# Patient Record
Sex: Female | Born: 1945
Health system: Southern US, Community
[De-identification: ages and names within clinical notes are randomized; demographics above are authoritative.]

## PROBLEM LIST (undated history)

## (undated) DIAGNOSIS — K5790 Diverticulosis of intestine, part unspecified, without perforation or abscess without bleeding: Secondary | ICD-10-CM

## (undated) DIAGNOSIS — Z923 Personal history of irradiation: Secondary | ICD-10-CM

## (undated) DIAGNOSIS — E785 Hyperlipidemia, unspecified: Secondary | ICD-10-CM

## (undated) DIAGNOSIS — C50919 Malignant neoplasm of unspecified site of unspecified female breast: Secondary | ICD-10-CM

## (undated) DIAGNOSIS — L308 Other specified dermatitis: Secondary | ICD-10-CM

## (undated) DIAGNOSIS — N200 Calculus of kidney: Secondary | ICD-10-CM

## (undated) DIAGNOSIS — Z87442 Personal history of urinary calculi: Secondary | ICD-10-CM

## (undated) DIAGNOSIS — M47812 Spondylosis without myelopathy or radiculopathy, cervical region: Secondary | ICD-10-CM

## (undated) DIAGNOSIS — I509 Heart failure, unspecified: Secondary | ICD-10-CM

## (undated) DIAGNOSIS — Z853 Personal history of malignant neoplasm of breast: Secondary | ICD-10-CM

## (undated) DIAGNOSIS — E049 Nontoxic goiter, unspecified: Secondary | ICD-10-CM

## (undated) DIAGNOSIS — I2510 Atherosclerotic heart disease of native coronary artery without angina pectoris: Secondary | ICD-10-CM

## (undated) DIAGNOSIS — M1990 Unspecified osteoarthritis, unspecified site: Secondary | ICD-10-CM

## (undated) DIAGNOSIS — I10 Essential (primary) hypertension: Secondary | ICD-10-CM

## (undated) DIAGNOSIS — D649 Anemia, unspecified: Secondary | ICD-10-CM

## (undated) DIAGNOSIS — C801 Malignant (primary) neoplasm, unspecified: Secondary | ICD-10-CM

## (undated) DIAGNOSIS — M47816 Spondylosis without myelopathy or radiculopathy, lumbar region: Secondary | ICD-10-CM

## (undated) DIAGNOSIS — K219 Gastro-esophageal reflux disease without esophagitis: Secondary | ICD-10-CM

## (undated) DIAGNOSIS — F339 Major depressive disorder, recurrent, unspecified: Secondary | ICD-10-CM

## (undated) DIAGNOSIS — T7840XA Allergy, unspecified, initial encounter: Secondary | ICD-10-CM

## (undated) DIAGNOSIS — E119 Type 2 diabetes mellitus without complications: Secondary | ICD-10-CM

## (undated) DIAGNOSIS — Z5189 Encounter for other specified aftercare: Secondary | ICD-10-CM

## (undated) HISTORY — DX: Unspecified osteoarthritis, unspecified site: M19.90

## (undated) HISTORY — DX: Nontoxic goiter, unspecified: E04.9

## (undated) HISTORY — DX: Other specified dermatitis: L30.8

## (undated) HISTORY — DX: Heart failure, unspecified: I50.9

## (undated) HISTORY — PX: KNEE ARTHROSCOPY: SUR90

## (undated) HISTORY — DX: Spondylosis without myelopathy or radiculopathy, lumbar region: M47.816

## (undated) HISTORY — DX: Allergy, unspecified, initial encounter: T78.40XA

## (undated) HISTORY — DX: Major depressive disorder, recurrent, unspecified: F33.9

## (undated) HISTORY — DX: Personal history of malignant neoplasm of breast: Z85.3

## (undated) HISTORY — DX: Diverticulosis of intestine, part unspecified, without perforation or abscess without bleeding: K57.90

## (undated) HISTORY — PX: APPENDECTOMY: SHX54

## (undated) HISTORY — DX: Calculus of kidney: N20.0

## (undated) HISTORY — DX: Atherosclerotic heart disease of native coronary artery without angina pectoris: I25.10

## (undated) HISTORY — DX: Hyperlipidemia, unspecified: E78.5

## (undated) HISTORY — DX: Type 2 diabetes mellitus without complications: E11.9

## (undated) HISTORY — DX: Encounter for other specified aftercare: Z51.89

## (undated) HISTORY — DX: Malignant (primary) neoplasm, unspecified: C80.1

## (undated) HISTORY — PX: CHOLECYSTECTOMY: SHX55

## (undated) HISTORY — DX: Spondylosis without myelopathy or radiculopathy, cervical region: M47.812

## (undated) HISTORY — DX: Essential (primary) hypertension: I10

## (undated) HISTORY — PX: TUBAL LIGATION: SHX77

## (undated) HISTORY — DX: Gastro-esophageal reflux disease without esophagitis: K21.9

## (undated) HISTORY — PX: TOTAL HIP ARTHROPLASTY: SHX124

---

## 1995-03-24 DIAGNOSIS — C50919 Malignant neoplasm of unspecified site of unspecified female breast: Secondary | ICD-10-CM

## 1995-03-24 DIAGNOSIS — Z923 Personal history of irradiation: Secondary | ICD-10-CM | POA: Insufficient documentation

## 1995-03-24 HISTORY — PX: BREAST LUMPECTOMY: SHX2

## 1995-03-24 HISTORY — DX: Malignant neoplasm of unspecified site of unspecified female breast: C50.919

## 1995-03-24 HISTORY — DX: Personal history of irradiation: Z92.3

## 1996-03-23 HISTORY — PX: REDUCTION MAMMAPLASTY: SUR839

## 2011-01-28 DIAGNOSIS — I2510 Atherosclerotic heart disease of native coronary artery without angina pectoris: Secondary | ICD-10-CM | POA: Insufficient documentation

## 2011-01-28 HISTORY — DX: Atherosclerotic heart disease of native coronary artery without angina pectoris: I25.10

## 2011-06-19 DIAGNOSIS — M545 Low back pain, unspecified: Secondary | ICD-10-CM

## 2011-06-19 DIAGNOSIS — M47817 Spondylosis without myelopathy or radiculopathy, lumbosacral region: Secondary | ICD-10-CM

## 2011-06-19 DIAGNOSIS — M542 Cervicalgia: Secondary | ICD-10-CM

## 2011-06-19 DIAGNOSIS — M47812 Spondylosis without myelopathy or radiculopathy, cervical region: Secondary | ICD-10-CM

## 2011-06-19 HISTORY — DX: Cervicalgia: M54.2

## 2011-06-19 HISTORY — DX: Spondylosis without myelopathy or radiculopathy, cervical region: M47.812

## 2011-06-19 HISTORY — DX: Low back pain, unspecified: M54.50

## 2011-06-19 HISTORY — DX: Spondylosis without myelopathy or radiculopathy, lumbosacral region: M47.817

## 2014-11-22 DIAGNOSIS — Z9109 Other allergy status, other than to drugs and biological substances: Secondary | ICD-10-CM | POA: Insufficient documentation

## 2014-11-22 DIAGNOSIS — J3081 Allergic rhinitis due to animal (cat) (dog) hair and dander: Secondary | ICD-10-CM | POA: Insufficient documentation

## 2014-11-22 DIAGNOSIS — Z91018 Allergy to other foods: Secondary | ICD-10-CM

## 2014-11-22 HISTORY — DX: Allergic rhinitis due to animal (cat) (dog) hair and dander: J30.81

## 2014-11-22 HISTORY — DX: Allergy to other foods: Z91.018

## 2014-11-22 HISTORY — DX: Other allergy status, other than to drugs and biological substances: Z91.09

## 2016-03-31 DIAGNOSIS — N200 Calculus of kidney: Secondary | ICD-10-CM

## 2016-03-31 DIAGNOSIS — Z9889 Other specified postprocedural states: Secondary | ICD-10-CM

## 2016-03-31 DIAGNOSIS — K219 Gastro-esophageal reflux disease without esophagitis: Secondary | ICD-10-CM

## 2016-03-31 DIAGNOSIS — R9439 Abnormal result of other cardiovascular function study: Secondary | ICD-10-CM

## 2016-03-31 DIAGNOSIS — E049 Nontoxic goiter, unspecified: Secondary | ICD-10-CM

## 2016-03-31 HISTORY — DX: Nontoxic goiter, unspecified: E04.9

## 2016-03-31 HISTORY — DX: Other specified postprocedural states: Z98.890

## 2016-03-31 HISTORY — DX: Gastro-esophageal reflux disease without esophagitis: K21.9

## 2016-03-31 HISTORY — DX: Calculus of kidney: N20.0

## 2016-03-31 HISTORY — DX: Abnormal result of other cardiovascular function study: R94.39

## 2016-06-16 DIAGNOSIS — J343 Hypertrophy of nasal turbinates: Secondary | ICD-10-CM | POA: Insufficient documentation

## 2016-06-16 HISTORY — DX: Hypertrophy of nasal turbinates: J34.3

## 2016-10-16 ENCOUNTER — Encounter: Payer: Self-pay | Admitting: Student in an Organized Health Care Education/Training Program

## 2016-10-16 ENCOUNTER — Ambulatory Visit (INDEPENDENT_AMBULATORY_CARE_PROVIDER_SITE_OTHER): Payer: Medicare Other | Admitting: Student in an Organized Health Care Education/Training Program

## 2016-10-16 VITALS — BP 122/80 | HR 74 | Temp 98.6°F | Ht 61.0 in | Wt 177.0 lb

## 2016-10-16 DIAGNOSIS — Z7689 Persons encountering health services in other specified circumstances: Secondary | ICD-10-CM | POA: Diagnosis not present

## 2016-10-16 DIAGNOSIS — I509 Heart failure, unspecified: Secondary | ICD-10-CM | POA: Insufficient documentation

## 2016-10-16 DIAGNOSIS — Z794 Long term (current) use of insulin: Secondary | ICD-10-CM | POA: Diagnosis not present

## 2016-10-16 DIAGNOSIS — M79641 Pain in right hand: Secondary | ICD-10-CM | POA: Insufficient documentation

## 2016-10-16 DIAGNOSIS — E118 Type 2 diabetes mellitus with unspecified complications: Secondary | ICD-10-CM | POA: Insufficient documentation

## 2016-10-16 DIAGNOSIS — C50919 Malignant neoplasm of unspecified site of unspecified female breast: Secondary | ICD-10-CM | POA: Insufficient documentation

## 2016-10-16 DIAGNOSIS — I10 Essential (primary) hypertension: Secondary | ICD-10-CM | POA: Diagnosis not present

## 2016-10-16 DIAGNOSIS — E119 Type 2 diabetes mellitus without complications: Secondary | ICD-10-CM

## 2016-10-16 HISTORY — DX: Type 2 diabetes mellitus with unspecified complications: E11.8

## 2016-10-16 LAB — POCT GLYCOSYLATED HEMOGLOBIN (HGB A1C): Hemoglobin A1C: 10

## 2016-10-16 MED ORDER — INSULIN LISPRO 100 UNIT/ML ~~LOC~~ SOLN: 16.0000 [IU] | Freq: Three times a day (TID) | SUBCUTANEOUS | 11 refills | Status: DC

## 2016-10-16 MED ORDER — VALSARTAN 160 MG PO TABS: 160.0000 mg | ORAL_TABLET | Freq: Every day | ORAL | 0 refills | Status: DC

## 2016-10-16 MED ORDER — METFORMIN HCL 500 MG PO TABS: 500.0000 mg | ORAL_TABLET | Freq: Every day | ORAL | 0 refills | Status: DC

## 2016-10-16 MED ORDER — INSULIN GLARGINE 100 UNIT/ML ~~LOC~~ SOLN: 56.0000 [IU] | Freq: Every day | SUBCUTANEOUS | 11 refills | Status: DC

## 2016-10-16 MED ORDER — HYDROCHLOROTHIAZIDE 25 MG PO TABS
25.0000 mg | ORAL_TABLET | Freq: Every day | ORAL | 3 refills | Status: DC
Start: 2016-10-16 — End: 2017-07-07

## 2016-10-16 NOTE — Assessment & Plan Note (Addendum)
-   BP controlled today - on valsartan 160 mg daily, HCTZ 25 mg daily - refilled medications - check lipid panel today - check Bmet for creatinine and electrolytes on these medications as she is new to our clinic

## 2016-10-16 NOTE — Assessment & Plan Note (Signed)
-   refilled medications - HbA1c ordered today (previously elevated at 12.0 3 months ago per patient report) - recheck HbA1c today - have patient return for diabetes visit, to make appt as she leaves office today

## 2016-10-16 NOTE — Assessment & Plan Note (Signed)
-   may be tendon pain/injury from moving boxes - continue OTC pain control - continue to encourage movement of the hand, stretches and exercise - follow up if worsens or fails to improve

## 2016-10-16 NOTE — Patient Instructions (Signed)
It was a pleasure meeting you today in our clinic. Here is the treatment plan we have discussed and agreed upon together:  - for your hand stiffness, please continue to stretch and move your hand every day - you can use ibuprofen and tylenol for hand pain  We will collect an HbA1c and blood work at today's visit and I will call you with the results. As you are leaving today, please schedule a follow up appointment to be seen in our clinic as soon as there is availability.  Our clinic's number is [phone]. Please call with questions or concerns about what we discussed today.  Be well, Dr. Burr Medico

## 2016-10-16 NOTE — Progress Notes (Signed)
CC: Initial visit to establish care and right hand stiffness  HPI: Bonnie Reed is a 71 y.o. female  who presents to [facility] today to establish care as a new patient, and notes right hand intermittent stiffness.  Right wrist pain - 2 weeks duration - Associated with stiffness - came on when moving boxes, recently moved houses - ibuprofen helps - notices stiffness is worse in AM asnd improves with stretching and moving her hand - no recent fevers, no warmth or redness over the hand or finger joints  Reported PMH: Hay fever Breast Cancer Chronic pain Congestive heart failure - No medications, diagnosed 2010 HTN - valsartan-HCTZ 160 -25 mg daily Diabetes - last HbA1c 12.0 3 months ago per pt report  -  metformin 500 mg daily  - lantus 56u nightly  - humalog 16u TID with meals   PSH: Cholecystectomy 1975 Appendectomy 1957 C-section 1972 Tubal ligation 1973 Orthopedic surgery 2007 Lumpectomy 1995   Family Hx: [place], asthma, lung cancer in father, heart disease in mother, diabetes, high cholesterol, HTN, kidney disease and stroke in mother and grandparents  Social Hx: No tobacco, no Etoh, no recreational drug use. Does not exercise regularly. Lives at home with sister and her husband  Allergies: Hay fever Codeine (nausea)  health maintenance Colonoscopy 2018 (normal per pt report) Mammogram 2018 (normal per pt report) Heart stress test 2010   Review of Symptoms:  See HPI for ROS.   CC, SH/smoking status, and VS noted.  Objective: BP 122/80   Pulse 74   Temp 98.6 F (37 C) (Oral)   Ht 5' 1" (1.549 m)   Wt 177 lb (80.3 kg)   LMP 10/17/1991 (Exact Date)   SpO2 94%   BMI 33.44 kg/m  GEN: NAD, alert, cooperative, and pleasant. EYE: no conjunctival injection, pupils equally round and reactive to light ENMT: normal tympanic light reflex, no nasal polyps,no rhinorrhea, no pharyngeal erythema or exudates NECK: full ROM RESPIRATORY: clear to auscultation  bilaterally with no wheezes, rhonchi or rales, good effort CV: RRR, no m/r/g, no peripheral edema GI: soft, non-tender, non-distended, no hepatosplenomegaly SKIN: warm and dry, no rashes or lesions MSK: +right hand tenderness over dorsal and ventral aspects of the hand, pain with passive and active extension of the fingers, no redness or swelling, strong palpable pulses noted NEURO: II-XII grossly intact PSYCH: AAOx3, appropriate affect  Assessment and plan:  Hypertension - BP controlled today - on valsartan 160 mg daily, HCTZ 25 mg daily - refilled medications - check lipid panel today - check Bmet for creatinine and electrolytes on these medications as she is new to our clinic  Diabetes ([place]) - refilled medications - HbA1c ordered today (previously elevated at 12.0 3 months ago per patient report) - recheck HbA1c today - have patient return for diabetes visit, to make appt as she leaves office today  Right hand pain - may be tendon pain/injury from moving boxes - continue OTC pain control - continue to encourage movement of the hand, stretches and exercise - follow up if worsens or fails to improve   Orders Placed This Encounter  Procedures  . Lipid panel  . CMP14+EGFR  . POCT glycosylated hemoglobin (Hb A1C)    Meds ordered this encounter  Medications  . metFORMIN (GLUCOPHAGE) 500 MG tablet    Sig: Take 1 tablet (500 mg total) by mouth daily.    Dispense:  90 tablet    Refill:  0  . insulin glargine (LANTUS) 100 UNIT/ML  injection    Sig: Inject 0.56 mLs (56 Units total) into the skin at bedtime.    Dispense:  10 mL    Refill:  11  . insulin lispro (HUMALOG) 100 UNIT/ML injection    Sig: Inject 0.16 mLs (16 Units total) into the skin 3 (three) times daily before meals.    Dispense:  10 mL    Refill:  11  . valsartan (DIOVAN) 160 MG tablet    Sig: Take 1 tablet (160 mg total) by mouth daily.    Dispense:  30 tablet    Refill:  0  . hydrochlorothiazide  (HYDRODIURIL) 25 MG tablet    Sig: Take 1 tablet (25 mg total) by mouth daily.    Dispense:  90 tablet    Refill:  3     Everrett Coombe, MD,MS,  PGY2 10/16/2016 10:51 AM

## 2016-10-17 LAB — CMP14+EGFR
A/G RATIO: 1.3 (ref 1.2–2.2)
ALT: 15 IU/L (ref 0–32)
AST: 18 IU/L (ref 0–40)
Albumin: 4.1 g/dL (ref 3.5–4.8)
Alkaline Phosphatase: 143 IU/L — ABNORMAL HIGH (ref 39–117)
BILIRUBIN TOTAL: 0.2 mg/dL (ref 0.0–1.2)
BUN/Creatinine Ratio: 15 (ref 12–28)
BUN: 12 mg/dL (ref 8–27)
CALCIUM: 9.8 mg/dL (ref 8.7–10.3)
CHLORIDE: 103 mmol/L (ref 96–106)
CO2: 21 mmol/L (ref 20–29)
Creatinine, Ser: 0.82 mg/dL (ref 0.57–1.00)
GFR, EST AFRICAN AMERICAN: 84 mL/min/{1.73_m2} (ref >59)
GFR, EST NON AFRICAN AMERICAN: 73 mL/min/{1.73_m2} (ref >59)
GLOBULIN, TOTAL: 3.1 g/dL (ref 1.5–4.5)
Glucose: 130 mg/dL — ABNORMAL HIGH (ref 65–99)
POTASSIUM: 3.8 mmol/L (ref 3.5–5.2)
SODIUM: 141 mmol/L (ref 134–144)
Total Protein: 7.2 g/dL (ref 6.0–8.5)

## 2016-10-17 LAB — LIPID PANEL
CHOL/HDL RATIO: 3.8 ratio (ref 0.0–4.4)
Cholesterol, Total: 149 mg/dL (ref 100–199)
HDL: 39 mg/dL — ABNORMAL LOW (ref >39)
LDL Calculated: 94 mg/dL (ref 0–99)
Triglycerides: 78 mg/dL (ref 0–149)
VLDL CHOLESTEROL CAL: 16 mg/dL (ref 5–40)

## 2016-11-04 ENCOUNTER — Telehealth: Payer: Self-pay | Admitting: Student in an Organized Health Care Education/Training Program

## 2016-11-04 ENCOUNTER — Encounter: Payer: Self-pay | Admitting: Licensed Clinical Social Worker

## 2016-11-04 ENCOUNTER — Ambulatory Visit (INDEPENDENT_AMBULATORY_CARE_PROVIDER_SITE_OTHER): Payer: Medicare Other | Admitting: Student in an Organized Health Care Education/Training Program

## 2016-11-04 ENCOUNTER — Encounter: Payer: Self-pay | Admitting: Student in an Organized Health Care Education/Training Program

## 2016-11-04 VITALS — BP 135/80 | HR 86 | Temp 98.0°F | Wt 177.0 lb

## 2016-11-04 DIAGNOSIS — F321 Major depressive disorder, single episode, moderate: Secondary | ICD-10-CM | POA: Diagnosis present

## 2016-11-04 DIAGNOSIS — E118 Type 2 diabetes mellitus with unspecified complications: Secondary | ICD-10-CM | POA: Diagnosis not present

## 2016-11-04 DIAGNOSIS — Z794 Long term (current) use of insulin: Secondary | ICD-10-CM

## 2016-11-04 NOTE — Progress Notes (Signed)
Total time:15 minutes Type of Service: Sharon warm handoff  Interpretor:No.    SUBJECTIVE: Bonnie Reed is a 71 y.o. female  Patient was referred by Dr. Burr Medico for: symptoms of depression and Stress at home.  Patient reports the following symptoms/concerns: difficulty sleeping, change in appetite, feeling unhappy, concerns with her sister's change in behavior and caring for her brother-in-law .  Hx of therapy 10 years ago, no inpatient treatment, denies SI  LIFE CONTEXT:  Family & Social: lives with sister and brother-in-law,  School/ Work: does not work  Life changes: recently moved to [place] from [place], Sister had a stroke shortly after they moved to [place], concerns with behavioral changes for sister. Now has to care for sister and brother-in-law.  GOALS:  Patient will reduce symptoms of: stress and depressive symptoms , and increase knowledge/ ability LH:TDSKAJ skills, self-management skills and stress reduction,   Intervention: Reflective listening, Behavioral Therapy (Relaxed breathing), [employer] and psychoeducation.    Issues discussed: relaxation techniques, sleep hygiene, community support (resources for sister and brother-in-law.      ASSESSMENT:Patient currently experiencing stress and symptoms of situational depression .  Symptoms of exacerbated by family stressor( recent behavioral changes with sister due to stroke and caring for brother-in-law that has dementia).  Patient may benefit from, and is in agreement to receive further assessment and brief therapeutic interventions to assist with managing her stress.  Patient has declined a F/U call from [facility].  PLAN: 1. Patient will implement relaxed breathing and sleep hygiene check list 2. Patient will look into support group information provided 3. Patient will talk to sister about going to see her doctor ref. Changes in her behavior. 4. Patient will schedule a f/u visit with  [facility].  Casimer Lanius, LCSW Licensed Clinical Social Worker [place]   [phone] 3:13 PM

## 2016-11-04 NOTE — Progress Notes (Signed)
   CC: diabetes and depressive episode  HPI: Bonnie Reed is a 71 y.o. female .   DIABETES Disease Monitoring: 09/2016 A1c was 10.0 Blood Sugar ranges- AM sugar 192, 172, 270. Rarely less than 150 and have not been less than 120 "in years"  Polyuria/phagia/dipsia- none        Visual problems- Denies Medications: 16 TID humalog, 52u Lantus  Compliance- 90% Per patient Hypoglycemic symptoms- one time last week  Depession "I am real depressed today." Her sister has recently had a stroke and has become verbally abusive. Sister is fine during the day, but mean at night.  Patient is also responsible for caring for her brother-in-law who is disabled with alzheimers. She says that if she does not care for them, they would have to go to SNF. She does not feel ready to have them go to SNF. She feels if she could just take time to care for herself she would be better able to cope with her sister's meanness. - Sleep: decreased, states she feels amped up and has anxiety because altercations with her sister tend to occur at night before bed - Interest: decreased she states due to feeling distracted by her sister's mean words - Guilt: yes, in particular about inadequacy in caring for her family - Energy: decreased she says in setting of less sleep as noted above - Concentration: Worsened, states she is worried about when sister is going to snap - Appetite: decreased - Psychometer: difficult to get out bed - Suicide/Homicide: Denies  Monitoring Labs and Parameters Last A1C:  Lab Results  Component Value Date   HGBA1C 10.0 10/16/2016    Overdue health maintenance Mammogram - completed per patient, health maintenance request form signed today. Colonoscopy form provided.  Review of Symptoms:  See HPI for ROS.   CC, SH/smoking status, and VS noted.  Objective: BP 135/80 (BP Location: Right Arm, Patient Position: Sitting, Cuff Size: Normal)   Pulse 86   Temp 98 F (36.7 C) (Oral)   Wt 177  lb (80.3 kg)   LMP 10/17/1991 (Exact Date)   SpO2 96%   BMI 33.44 kg/m  GEN: NAD, alert, cooperative, and pleasant. RESPIRATORY: clear to auscultation bilaterally with no wheezes, rhonchi or rales, good effort CV: RRR, no m/r/g, no peripheral edema GI: soft, non-tender, non-distended, no hepatosplenomegaly PSYCH: AAOx3, appropriate affect  Assessment and plan:  Diabetes (HCC) HbA1c 10.0 - patient on metformin 500 mg, prefers not to go up on this due to stomach upset - Room to inc lantus given AM hyperglycemia will conservatively go up from 52 units to 55 units each AM - red flags and return precautions provided - patient asked for refill on humalog needles - called to ask her for specific size needle required but have not heard back yet.   Current moderate episode of major depressive disorder without prior episode (HCC) - SIGECAPS score of 7 - No suicidal or homicidal ideation - seems to be stressors related home environment/having to care for her sister and brother in law - consult to integrative care today - patient to follow up - please see separate integrative care note - can consider starting an antidepressant if persists, however I wonder if she takes the time to speak with behavioral health and learn coping strategies this will be therapeutic enough to get her through this episode   Everrett Coombe, MD,MS,  PGY2 11/04/2016 3:48 PM

## 2016-11-04 NOTE — Patient Instructions (Addendum)
It was a pleasure seeing you today in our clinic. Today we discussed your diabetes and your mental health. Here is the treatment plan we have discussed and agreed upon together:  Diabetes Your diabetes is not well controlled. Your A1c is 10 Your goal is to have an A1c < 7.5 Medicine Changes: Please increase your lantus from 52 units to 55 units each night.  If you notice palpitations or sweating, confusion, or other symptoms of low blood sugars, please take a sip of orange juice or have something with sugar and check your blood sugar.  This would be a reason to call our office.   Come back for your next A1c in October.  Please write down your morning blood sugars so that we can better titrate your medications. Bring your list of blood sugars into the next appointment.   Our clinic's number is [phone]. Please call with questions or concerns about what we discussed today.  Be well, Dr. Burr Medico

## 2016-11-04 NOTE — Assessment & Plan Note (Signed)
-   SIGECAPS score of 7 - No suicidal or homicidal ideation - seems to be stressors related home environment/having to care for her sister and brother in law - consult to integrative care today - patient to follow up - please see separate integrative care note - can consider starting an antidepressant if persists, however I wonder if she takes the time to speak with behavioral health and learn coping strategies this will be therapeutic enough to get her through this episode

## 2016-11-04 NOTE — Telephone Encounter (Signed)
LVM for patient to call clinic and inform as to the size humalog needles she has at home so they can be refilled.

## 2016-11-04 NOTE — Assessment & Plan Note (Signed)
HbA1c 10.0 - patient on metformin 500 mg, prefers not to go up on this due to stomach upset - Room to inc lantus given AM hyperglycemia will conservatively go up from 52 units to 55 units each AM - red flags and return precautions provided - patient asked for refill on humalog needles - called to ask her for specific size needle required but have not heard back yet.

## 2016-12-10 ENCOUNTER — Emergency Department (HOSPITAL_BASED_OUTPATIENT_CLINIC_OR_DEPARTMENT_OTHER)
Admission: EM | Admit: 2016-12-10 | Discharge: 2016-12-10 | Disposition: A | Discharge status: Home / Self Care | Payer: Medicare Other | Attending: Emergency Medicine | Admitting: Emergency Medicine

## 2016-12-10 ENCOUNTER — Encounter (HOSPITAL_BASED_OUTPATIENT_CLINIC_OR_DEPARTMENT_OTHER): Payer: Self-pay | Admitting: Emergency Medicine

## 2016-12-10 ENCOUNTER — Emergency Department (HOSPITAL_BASED_OUTPATIENT_CLINIC_OR_DEPARTMENT_OTHER): Payer: Medicare Other

## 2016-12-10 DIAGNOSIS — J0100 Acute maxillary sinusitis, unspecified: Secondary | ICD-10-CM | POA: Diagnosis not present

## 2016-12-10 DIAGNOSIS — E119 Type 2 diabetes mellitus without complications: Secondary | ICD-10-CM | POA: Diagnosis not present

## 2016-12-10 DIAGNOSIS — I110 Hypertensive heart disease with heart failure: Secondary | ICD-10-CM | POA: Diagnosis not present

## 2016-12-10 DIAGNOSIS — Z853 Personal history of malignant neoplasm of breast: Secondary | ICD-10-CM | POA: Insufficient documentation

## 2016-12-10 DIAGNOSIS — M545 Low back pain, unspecified: Secondary | ICD-10-CM

## 2016-12-10 DIAGNOSIS — Z794 Long term (current) use of insulin: Secondary | ICD-10-CM | POA: Insufficient documentation

## 2016-12-10 DIAGNOSIS — R1031 Right lower quadrant pain: Secondary | ICD-10-CM | POA: Insufficient documentation

## 2016-12-10 DIAGNOSIS — Z87442 Personal history of urinary calculi: Secondary | ICD-10-CM | POA: Diagnosis not present

## 2016-12-10 DIAGNOSIS — I509 Heart failure, unspecified: Secondary | ICD-10-CM | POA: Insufficient documentation

## 2016-12-10 LAB — CBC WITH DIFFERENTIAL/PLATELET
BASOS ABS: 0 10*3/uL (ref 0.0–0.1)
BASOS PCT: 0 %
EOS ABS: 0.3 10*3/uL (ref 0.0–0.7)
Eosinophils Relative: 3 %
HCT: 38.9 % (ref 36.0–46.0)
HEMOGLOBIN: 12.6 g/dL (ref 12.0–15.0)
LYMPHS PCT: 27 %
Lymphs Abs: 2.7 10*3/uL (ref 0.7–4.0)
MCH: 22.4 pg — AB (ref 26.0–34.0)
MCHC: 32.4 g/dL (ref 30.0–36.0)
MCV: 69.1 fL — ABNORMAL LOW (ref 78.0–100.0)
MONO ABS: 1.2 10*3/uL — AB (ref 0.1–1.0)
Monocytes Relative: 12 %
NEUTROS ABS: 5.7 10*3/uL (ref 1.7–7.7)
Neutrophils Relative %: 58 %
PLATELETS: 304 10*3/uL (ref 150–400)
RBC: 5.63 MIL/uL — ABNORMAL HIGH (ref 3.87–5.11)
RDW: 17.6 % — AB (ref 11.5–15.5)
WBC: 9.9 10*3/uL (ref 4.0–10.5)

## 2016-12-10 LAB — URINALYSIS, MICROSCOPIC (REFLEX)

## 2016-12-10 LAB — BASIC METABOLIC PANEL
ANION GAP: 8 (ref 5–15)
BUN: 15 mg/dL (ref 6–20)
CO2: 25 mmol/L (ref 22–32)
CREATININE: 1.05 mg/dL — AB (ref 0.44–1.00)
Calcium: 9.4 mg/dL (ref 8.9–10.3)
Chloride: 106 mmol/L (ref 101–111)
GFR, EST NON AFRICAN AMERICAN: 53 mL/min — AB (ref >60)
GLUCOSE: 163 mg/dL — AB (ref 65–99)
Potassium: 3.6 mmol/L (ref 3.5–5.1)
Sodium: 139 mmol/L (ref 135–145)

## 2016-12-10 LAB — URINALYSIS, ROUTINE W REFLEX MICROSCOPIC
Bilirubin Urine: NEGATIVE
Glucose, UA: NEGATIVE mg/dL
Ketones, ur: NEGATIVE mg/dL
NITRITE: NEGATIVE
PROTEIN: 30 mg/dL — AB
pH: 6 (ref 5.0–8.0)

## 2016-12-10 MED ORDER — HYDROCODONE-ACETAMINOPHEN 5-325 MG PO TABS: 1.0000 | ORAL_TABLET | Freq: Four times a day (QID) | ORAL | 0 refills | Status: DC | PRN

## 2016-12-10 MED ORDER — HYDROCODONE-ACETAMINOPHEN 5-325 MG PO TABS
1.0000 | ORAL_TABLET | Freq: Once | ORAL | Status: AC
  Administered 2016-12-10: 1 via ORAL
  Filled 2016-12-10: qty 1

## 2016-12-10 MED ORDER — AZITHROMYCIN 250 MG PO TABS: 250.0000 mg | ORAL_TABLET | Freq: Every day | ORAL | 0 refills | Status: DC

## 2016-12-10 NOTE — ED Triage Notes (Signed)
Patient states that she is having right sided flank pain with increased urine frequency. The patient also reports that she has had a sinus congestion that led to generalize aches last night
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
## 2016-12-10 NOTE — Discharge Instructions (Signed)
Workup for the back pain and flank pain without any acute findings. CT negative, urinalysis negative. May be muscular skeletal back pain in nature. Take pain medication as directed. Follow-up if not improving in 7 days or for any new or worse symptoms. Also take the antibiotic as directed for the sinus infection.

## 2016-12-31 ENCOUNTER — Emergency Department (HOSPITAL_COMMUNITY): Payer: Medicare Other

## 2016-12-31 ENCOUNTER — Encounter (HOSPITAL_COMMUNITY): Payer: Self-pay | Admitting: Emergency Medicine

## 2016-12-31 ENCOUNTER — Telehealth: Payer: Self-pay | Admitting: *Deleted

## 2016-12-31 ENCOUNTER — Emergency Department (HOSPITAL_COMMUNITY)
Admission: EM | Admit: 2016-12-31 | Discharge: 2016-12-31 | Disposition: A | Discharge status: Home / Self Care | Payer: Medicare Other | Attending: Emergency Medicine | Admitting: Emergency Medicine

## 2016-12-31 DIAGNOSIS — I509 Heart failure, unspecified: Secondary | ICD-10-CM | POA: Insufficient documentation

## 2016-12-31 DIAGNOSIS — S59912A Unspecified injury of left forearm, initial encounter: Secondary | ICD-10-CM | POA: Diagnosis not present

## 2016-12-31 DIAGNOSIS — S161XXA Strain of muscle, fascia and tendon at neck level, initial encounter: Secondary | ICD-10-CM | POA: Diagnosis not present

## 2016-12-31 DIAGNOSIS — Y93K1 Activity, walking an animal: Secondary | ICD-10-CM | POA: Insufficient documentation

## 2016-12-31 DIAGNOSIS — Y92008 Other place in unspecified non-institutional (private) residence as the place of occurrence of the external cause: Secondary | ICD-10-CM | POA: Diagnosis not present

## 2016-12-31 DIAGNOSIS — W19XXXA Unspecified fall, initial encounter: Secondary | ICD-10-CM

## 2016-12-31 DIAGNOSIS — Y999 Unspecified external cause status: Secondary | ICD-10-CM | POA: Insufficient documentation

## 2016-12-31 DIAGNOSIS — S199XXA Unspecified injury of neck, initial encounter: Secondary | ICD-10-CM | POA: Diagnosis present

## 2016-12-31 DIAGNOSIS — Z794 Long term (current) use of insulin: Secondary | ICD-10-CM | POA: Insufficient documentation

## 2016-12-31 DIAGNOSIS — Z7982 Long term (current) use of aspirin: Secondary | ICD-10-CM | POA: Insufficient documentation

## 2016-12-31 DIAGNOSIS — W010XXA Fall on same level from slipping, tripping and stumbling without subsequent striking against object, initial encounter: Secondary | ICD-10-CM | POA: Diagnosis not present

## 2016-12-31 DIAGNOSIS — E119 Type 2 diabetes mellitus without complications: Secondary | ICD-10-CM | POA: Diagnosis not present

## 2016-12-31 DIAGNOSIS — Z79899 Other long term (current) drug therapy: Secondary | ICD-10-CM | POA: Insufficient documentation

## 2016-12-31 DIAGNOSIS — S4992XA Unspecified injury of left shoulder and upper arm, initial encounter: Secondary | ICD-10-CM | POA: Diagnosis not present

## 2016-12-31 DIAGNOSIS — I110 Hypertensive heart disease with heart failure: Secondary | ICD-10-CM | POA: Insufficient documentation

## 2016-12-31 MED ORDER — OXYCODONE-ACETAMINOPHEN 5-325 MG PO TABS
1.0000 | ORAL_TABLET | Freq: Once | ORAL | Status: AC
  Administered 2016-12-31: 1 via ORAL
  Filled 2016-12-31: qty 1

## 2016-12-31 MED ORDER — HYDROCODONE-ACETAMINOPHEN 5-325 MG PO TABS: 1.0000 | ORAL_TABLET | Freq: Four times a day (QID) | ORAL | 0 refills | Status: DC | PRN

## 2016-12-31 MED ORDER — IBUPROFEN 800 MG PO TABS: 800.0000 mg | ORAL_TABLET | Freq: Three times a day (TID) | ORAL | 0 refills | Status: DC | PRN

## 2016-12-31 NOTE — ED Provider Notes (Signed)
[facility] DEPT Provider Note   CSN: [id number] Arrival date & time: 12/31/16  7564     History   Chief Complaint Chief Complaint  Patient presents with  . Fall    HPI Bonnie Reed is a 70 y.o. female.  HPI Patient presents to the emergency department with injuries following a fall.  The patient states that she just finished walking her dog when she got her foot caught on the bottom of the dog fence and fell on her left side.  The patient, states she is having pain in the left shoulder.  She fell directly onto the shoulder.  She also is complaining of pain to the forearm and elbow region.  The patient states that movement and palpation make the pain worse.  She did not take any medications prior to arrival for her symptoms.  Patient denies chest pain, shortness of breath, nausea, vomiting, headache, blurred vision, weakness, numbness, dizziness, near syncope or syncope Past Medical History:  Diagnosis Date  . Allergy   . Blood transfusion without reported diagnosis   . Cancer ([place])   . CHF (congestive heart failure) ([place])   . Diabetes mellitus without complication ([place])   . Hypertension     Patient Active Problem List   Diagnosis Date Noted  . Current moderate episode of major depressive disorder without prior episode ([place]) 11/04/2016  . Breast cancer ([place]) 10/16/2016  . Diabetes ([place]) 10/16/2016  . Hypertension 10/16/2016  . CHF (congestive heart failure) ([place]) 10/16/2016    Past Surgical History:  Procedure Laterality Date  . APPENDECTOMY    . CESAREAN SECTION    . CHOLECYSTECTOMY    . TUBAL LIGATION      OB History    No data available       Home Medications    Prior to Admission medications   Medication Sig Start Date End Date Taking? Authorizing Provider  acetaminophen (TYLENOL) 500 MG tablet Take 1,000 mg by mouth daily as needed for mild pain.   Yes [provider]  aspirin EC 81 MG tablet Take 81 mg by mouth daily.   Yes [provider]  diphenhydrAMINE (BENADRYL) 25 mg capsule Take 25 mg by mouth daily as needed for allergies.   Yes [provider]  EPINEPHrine 0.3 mg/0.3 mL IJ SOAJ injection Inject 0.3 mg into the muscle. 12/07/14  Yes [provider]  hydrochlorothiazide (HYDRODIURIL) 25 MG tablet Take 1 tablet (25 mg total) by mouth daily. 10/16/16  Yes Everrett Coombe, MD  ibuprofen (ADVIL,MOTRIN) 200 MG tablet Take 400 mg by mouth daily as needed for headache.   Yes [provider]  insulin glargine (LANTUS) 100 UNIT/ML injection Inject 0.56 mLs (56 Units total) into the skin at bedtime. 10/16/16  Yes Everrett Coombe, MD  insulin lispro (HUMALOG) 100 UNIT/ML injection Inject 0.16 mLs (16 Units total) into the skin 3 (three) times daily before meals. 10/16/16  Yes Everrett Coombe, MD  metFORMIN (GLUCOPHAGE) 500 MG tablet Take 1 tablet (500 mg total) by mouth daily. 10/16/16  Yes Everrett Coombe, MD  montelukast (SINGULAIR) 10 MG tablet Take 10 mg by mouth daily. 09/08/16  Yes [provider]  sodium chloride (OCEAN) 0.65 % nasal spray Place 1 spray into the nose daily as needed for congestion.   Yes [provider]  azithromycin (ZITHROMAX) 250 MG tablet Take 1 tablet (250 mg total) by mouth daily. Take first 2 tablets together, then 1 every day until finished. Patient not taking: Reported on  12/31/2016 12/10/16   Fredia Sorrow, MD  HYDROcodone-acetaminophen (NORCO/VICODIN) 5-325 MG tablet Take 1-2 tablets by mouth every 6 (six) hours as needed for moderate pain. Patient not taking: Reported on 12/31/2016 12/10/16   Fredia Sorrow, MD  valsartan (DIOVAN) 160 MG tablet Take 1 tablet (160 mg total) by mouth daily. 10/16/16   Everrett Coombe, MD    Family History Family History  Problem Relation Age of Onset  . Stroke Mother   . Kidney disease Mother   . Cancer Father     Social History Social History  Substance Use Topics  . Smoking status: Never Smoker  . Smokeless tobacco:  Never Used  . Alcohol use No     Allergies   Chlorpheniramine; Other; and Phenylpropanol   Review of Systems Review of Systems  All other systems negative except as documented in the HPI. All pertinent positives and negatives as reviewed in the HPI.  Physical Exam Updated Vital Signs BP (!) 173/137   Pulse 70   Temp 97.7 F (36.5 C) (Oral)   Resp 20   LMP 10/17/1991 (Exact Date)   SpO2 99%   Physical Exam  Constitutional: She is oriented to person, place, and time. She appears well-developed and well-nourished. No distress.  HENT:  Head: Normocephalic and atraumatic.  Mouth/Throat: Oropharynx is clear and moist.  Eyes: Pupils are equal, round, and reactive to light.  Neck: Normal range of motion. Neck supple.  Cardiovascular: Normal rate, regular rhythm and normal heart sounds.  Exam reveals no gallop and no friction rub.   No murmur heard. Pulmonary/Chest: Effort normal and breath sounds normal. No respiratory distress. She has no wheezes.  Abdominal: Soft. Bowel sounds are normal. She exhibits no distension. There is no tenderness.  Musculoskeletal:       Left shoulder: She exhibits decreased range of motion, tenderness, bony tenderness and pain. She exhibits no effusion, no deformity, no laceration, no spasm, normal pulse and normal strength.       Left hip: She exhibits tenderness. She exhibits normal range of motion, normal strength, no bony tenderness, no swelling, no crepitus and no deformity.       Cervical back: She exhibits tenderness. She exhibits normal range of motion, no bony tenderness, no swelling and no edema.       Thoracic back: Normal.       Lumbar back: Normal.  Neurological: She is alert and oriented to person, place, and time. She exhibits normal muscle tone. Coordination normal.  Skin: Skin is warm and dry. Capillary refill takes less than 2 seconds. No rash noted. No erythema.  Psychiatric: She has a normal mood and affect. Her behavior is normal.    Nursing note and vitals reviewed.    ED Treatments / Results  Labs (all labs ordered are listed, but only abnormal results are displayed) Labs Reviewed - No data to display  EKG  EKG Interpretation None       Radiology Dg Forearm Left  Result Date: 12/31/2016 CLINICAL DATA:  Fall. EXAM: LEFT FOREARM - 2 VIEW COMPARISON:  No recent prior . FINDINGS: No acute bony or joint abnormality identified. Degenerative changes noted about the left elbow and wrist. IMPRESSION: Diffuse degenerative change.  No acute abnormality. Electronically Signed   By: Marcello Moores  Register   On: 12/31/2016 13:58   Ct Head Wo Contrast  Result Date: 12/31/2016 CLINICAL DATA:  Fall today.  No loss of consciousness. EXAM: CT HEAD WITHOUT CONTRAST CT CERVICAL SPINE WITHOUT CONTRAST TECHNIQUE: Multidetector CT  imaging of the head and cervical spine was performed following the standard protocol without intravenous contrast. Multiplanar CT image reconstructions of the cervical spine were also generated. COMPARISON:  None. FINDINGS: CT HEAD FINDINGS Brain: No evidence of acute infarction, hemorrhage, hydrocephalus, extra-axial collection or mass lesion/mass effect. Vascular: No hyperdense vessel or unexpected calcification. Skull: Normal. Negative for fracture or focal lesion. Sinuses/Orbits: No acute finding. Other: None. CT CERVICAL SPINE FINDINGS Alignment: Mild reversal of normal lordosis is noted secondary to degenerative change. Skull base and vertebrae: No acute fracture. No primary bone lesion or focal pathologic process. Soft tissues and spinal canal: No prevertebral fluid or swelling. No visible canal hematoma. Disc levels: Moderate degenerative disc disease is noted at C4-5, C5-6 and C6-7 with anterior osteophyte formation. Upper chest: Negative. Other: None. IMPRESSION: Normal head CT. Multilevel degenerative disc disease. No acute abnormality seen in the cervical spine. Electronically Signed   By: Marijo Conception,  M.D.   On: 12/31/2016 13:46   Ct Cervical Spine Wo Contrast  Result Date: 12/31/2016 CLINICAL DATA:  Fall today.  No loss of consciousness. EXAM: CT HEAD WITHOUT CONTRAST CT CERVICAL SPINE WITHOUT CONTRAST TECHNIQUE: Multidetector CT imaging of the head and cervical spine was performed following the standard protocol without intravenous contrast. Multiplanar CT image reconstructions of the cervical spine were also generated. COMPARISON:  None. FINDINGS: CT HEAD FINDINGS Brain: No evidence of acute infarction, hemorrhage, hydrocephalus, extra-axial collection or mass lesion/mass effect. Vascular: No hyperdense vessel or unexpected calcification. Skull: Normal. Negative for fracture or focal lesion. Sinuses/Orbits: No acute finding. Other: None. CT CERVICAL SPINE FINDINGS Alignment: Mild reversal of normal lordosis is noted secondary to degenerative change. Skull base and vertebrae: No acute fracture. No primary bone lesion or focal pathologic process. Soft tissues and spinal canal: No prevertebral fluid or swelling. No visible canal hematoma. Disc levels: Moderate degenerative disc disease is noted at C4-5, C5-6 and C6-7 with anterior osteophyte formation. Upper chest: Negative. Other: None. IMPRESSION: Normal head CT. Multilevel degenerative disc disease. No acute abnormality seen in the cervical spine. Electronically Signed   By: Marijo Conception, M.D.   On: 12/31/2016 13:46   Dg Shoulder Left  Result Date: 12/31/2016 CLINICAL DATA:  Tripped and fell this morning. Left shoulder pain. The patient was unable to remove fissure which had radiodense rhine- stones. EXAM: LEFT SHOULDER - 2+ VIEW COMPARISON:  None in PACs FINDINGS: The imaging is limited due to technical factors and the patient's positioning. The bones are subjectively adequately mineralized. There is no acute fracture nor dislocation. As best as can be determined the glenohumeral and AC joints are normal. The observed portions of the left  clavicle and upper left ribs are normal. There are surgical clips in the left axillary region. IMPRESSION: There is no acute bony abnormality of the left shoulder. Electronically Signed   By: David  Martinique M.D.   On: 12/31/2016 10:33    Procedures Procedures (including critical care time)  Medications Ordered in ED Medications  oxyCODONE-acetaminophen (PERCOCET/ROXICET) 5-325 MG per tablet 1 tablet (1 tablet Oral Given 12/31/16 1207)     Initial Impression / Assessment and Plan / ED Course  I have reviewed the triage vital signs and the nursing notes.  Pertinent labs & imaging results that were available during my care of the patient were reviewed by me and considered in my medical decision making (see chart for details).     Patient be placed in a sling for comfort.  I advised her  to continue to do so range of motion to prevent any shoulder issues.  I will also refer her to orthopedics.  Told to return here for any worsening in her condition.  Patient agrees the plan and all questions were answered Final Clinical Impressions(s) / ED Diagnoses   Final diagnoses:  None    New Prescriptions New Prescriptions   No medications on file     Dalia Heading, Hershal Coria 12/31/16 [place]    Tanna Furry, MD 01/10/17 [phone]

## 2016-12-31 NOTE — Discharge Instructions (Signed)
Return here as needed.  Follow-up with the orthopedist provided.  Remember to use the sling for comfort, also try to gently move your shoulder to make sure that the joint stays fluid.  Use ice and heat on the areas that are sore

## 2016-12-31 NOTE — Telephone Encounter (Signed)
Reola Calkins, RN calling on behalf of [facility] requesting PCP to medication review. The medication review to see if patient will benefit from taking a statin drug due to the diagnosis of diabetes.  Call reference number: [id number].  Derl Barrow, RN

## 2016-12-31 NOTE — ED Triage Notes (Addendum)
Per EMS pt tripped on dog fencing resulting in left shoulder pain; no deformity. No neck/back pain.

## 2017-01-13 ENCOUNTER — Ambulatory Visit (INDEPENDENT_AMBULATORY_CARE_PROVIDER_SITE_OTHER): Payer: Medicare Other | Admitting: Orthopaedic Surgery

## 2017-01-13 DIAGNOSIS — M25512 Pain in left shoulder: Secondary | ICD-10-CM

## 2017-01-13 MED ORDER — NABUMETONE 500 MG PO TABS: 500.0000 mg | ORAL_TABLET | Freq: Two times a day (BID) | ORAL | 0 refills | Status: DC | PRN

## 2017-01-13 MED ORDER — METHYLPREDNISOLONE ACETATE 40 MG/ML IJ SUSP
40.0000 mg | INTRAMUSCULAR | Status: AC | PRN
  Administered 2017-01-13: 40 mg via INTRA_ARTICULAR

## 2017-01-13 MED ORDER — LIDOCAINE HCL 1 % IJ SOLN
3.0000 mL | INTRAMUSCULAR | Status: AC | PRN
  Administered 2017-01-13: 3 mL

## 2017-01-13 MED ORDER — TRAMADOL HCL 50 MG PO TABS: 50.0000 mg | ORAL_TABLET | Freq: Four times a day (QID) | ORAL | 0 refills | Status: DC | PRN

## 2017-01-13 NOTE — Progress Notes (Signed)
Office Visit Note   Patient: Bonnie Reed           Date of Birth: 1945-07-10           MRN: [id number] Visit Date: 01/13/2017              Requested by: Everrett Coombe, MD [address] [place], [place] [address] PCP: Everrett Coombe, MD   Assessment & Plan: Visit Diagnoses:  1. Acute pain of left shoulder     Plan: Based on her clinical exam there is a high likelihood that she is sustained a full-thickness rotator cuff tear.  I want to try at least a subacromial steroid injection to see if this would help her from a pain standpoint.  Talked about the risk and benefits of this into how this can affect her blood glucose and she is agreeable to try an injection.  I will see her back in 2 weeks to see how she is doing overall.  Encouraged to keep the shoulder moving.  I did give her prescription for tramadol and will send in some Relafen for her.  All questions and concerns were answered and addressed.  Follow-Up Instructions: Return in about 2 weeks (around 01/27/2017).   Orders:  Orders Placed This Encounter  Procedures  . Large Joint Injection/Arthrocentesis   Meds ordered this encounter  Medications  . traMADol (ULTRAM) 50 MG tablet    Sig: Take 1-2 tablets (50-100 mg total) by mouth every 6 (six) hours as needed.    Dispense:  60 tablet    Refill:  0  . nabumetone (RELAFEN) 500 MG tablet    Sig: Take 1 tablet (500 mg total) by mouth 2 (two) times daily as needed.    Dispense:  60 tablet    Refill:  0      Procedures: Large Joint Inj Date/Time: 01/13/2017 2:56 PM Performed by: Mcarthur Rossetti Authorized by: Jean Rosenthal Y   Location:  Shoulder Site:  L subacromial bursa Ultrasound Guidance: No   Fluoroscopic Guidance: No   Arthrogram: No   Medications:  3 mL lidocaine 1 %; 40 mg methylPREDNISolone acetate 40 MG/ML     Clinical Data: No additional findings.   Subjective: No chief complaint on file. The patient is a very pleasant  71 year old right-hand-dominant female who sustained a mechanical fall last week injuring her left shoulder.  She has had problems with reaching behind and overhead since then has been very available to her.  She has been taking ibuprofen has helped some.  She was given hydrocodone but it was too strong for her made her hallucinate.  She is to have problems reaching behind her and putting on her garments.  Is hurting quite significantly.  She denies any neck pain denies any numbness tingling her left hand.  She is 1 diabetic.  She reports good blood glucose control.  HPI  Review of Systems She denies any headache, chest pain, shortness of breath, fever, chills, nausea, vomiting.  Objective: Vital Signs: LMP 10/17/1991 (Exact Date)   Physical Exam She is alert and oriented x3 and in no acute distress Ortho Exam Examination of her left shoulder shows weakness in the rotator cuff with abduction and external rotation.  Reaching behind her with internal rotation adduction is very limited.  She uses her deltoid more to abduct her shoulder suggesting rotator cuff pathology. Specialty Comments:  No specialty comments available.  Imaging: No results found.  I was able to independently review x-rays of  her left shoulder from the emergency room and I see no acute findings in terms of fracture or dislocation.  The shoulder is well located. PMFS History: Patient Active Problem List   Diagnosis Date Noted  . Current moderate episode of major depressive disorder without prior episode ([place]) 11/04/2016  . Breast cancer ([place]) 10/16/2016  . Diabetes ([place]) 10/16/2016  . Hypertension 10/16/2016  . CHF (congestive heart failure) ([place]) 10/16/2016   Past Medical History:  Diagnosis Date  . Allergy   . Blood transfusion without reported diagnosis   . Cancer ([place])   . CHF (congestive heart failure) ([place])   . Diabetes mellitus without complication ([place])   . Hypertension     Family History  Problem  Relation Age of Onset  . Stroke Mother   . Kidney disease Mother   . Cancer Father     Past Surgical History:  Procedure Laterality Date  . APPENDECTOMY    . CESAREAN SECTION    . CHOLECYSTECTOMY    . TUBAL LIGATION     Social History   Occupational History  . Not on file.   Social History Main Topics  . Smoking status: Never Smoker  . Smokeless tobacco: Never Used  . Alcohol use No  . Drug use: No  . Sexual activity: Not Currently

## 2017-01-27 ENCOUNTER — Encounter (INDEPENDENT_AMBULATORY_CARE_PROVIDER_SITE_OTHER): Payer: Self-pay | Admitting: Orthopaedic Surgery

## 2017-01-27 ENCOUNTER — Other Ambulatory Visit (INDEPENDENT_AMBULATORY_CARE_PROVIDER_SITE_OTHER): Payer: Self-pay

## 2017-01-27 ENCOUNTER — Ambulatory Visit (INDEPENDENT_AMBULATORY_CARE_PROVIDER_SITE_OTHER): Payer: Medicare Other | Admitting: Physician Assistant

## 2017-01-27 DIAGNOSIS — M25512 Pain in left shoulder: Secondary | ICD-10-CM | POA: Diagnosis not present

## 2017-01-27 DIAGNOSIS — G8929 Other chronic pain: Secondary | ICD-10-CM

## 2017-01-27 DIAGNOSIS — M25511 Pain in right shoulder: Principal | ICD-10-CM

## 2017-01-27 MED ORDER — DIAZEPAM 5 MG PO TABS: ORAL_TABLET | ORAL | 0 refills | Status: DC

## 2017-01-27 NOTE — Addendum Note (Signed)
Addended by: Erskine Emery on: 01/27/2017 11:04 AM   Modules accepted: Orders

## 2017-01-27 NOTE — Progress Notes (Signed)
Mrs. Manzer returns today follow-up of her left shoulder.  She had a cortisone injection in her left shoulder by Dr. Ninfa Linden on 01/13/2017.  She states that shoulder injection did help but she is not 100% better.  She still having pain with any overhead activity.  Still has some waking pain.  She denies any numbness or tingling down the left arm.  Again her pain began after mechanical fall earlier in October resulting in injury to her left shoulder.  She is a type I diabetic with reported good glucose control..  Review of systems:Please see HPI otherwise negative  Physical exam: Left shoulder positive impingement sign.  She has weakness with external rotation against resistance on the left no weakness on the right.  Empty can test on the left negative on the right.  Forward flexion she is only able to come to 110 degrees actively passively to about 120 degrees is pain-free.  She has pain with abduction of the left shoulder.   Impression:Left shoulder pain acute  Plan: We will obtain an MRI of her left shoulder to rule out acute rotator cuff tear.  Have her follow-up after the MRI to go over results and discuss further treatment.  She is claustrophobic for wound give her some Valium prior to the procedure and at the time she is to have a driver.  Showed her pendulum, [place], and wall crawl exercises to help work on her range of motion.

## 2017-02-07 ENCOUNTER — Ambulatory Visit
Admission: RE | Admit: 2017-02-07 | Discharge: 2017-02-07 | Disposition: A | Discharge status: Home / Self Care | Payer: Medicare Other | Source: Ambulatory Visit | Attending: Physician Assistant | Admitting: Physician Assistant

## 2017-02-07 DIAGNOSIS — M25511 Pain in right shoulder: Principal | ICD-10-CM

## 2017-02-07 DIAGNOSIS — G8929 Other chronic pain: Secondary | ICD-10-CM

## 2017-02-15 ENCOUNTER — Encounter (INDEPENDENT_AMBULATORY_CARE_PROVIDER_SITE_OTHER): Payer: Self-pay | Admitting: Physician Assistant

## 2017-02-15 ENCOUNTER — Ambulatory Visit (INDEPENDENT_AMBULATORY_CARE_PROVIDER_SITE_OTHER): Payer: Medicare Other | Admitting: Physician Assistant

## 2017-02-15 DIAGNOSIS — M25512 Pain in left shoulder: Secondary | ICD-10-CM

## 2017-02-15 NOTE — Progress Notes (Signed)
Bonnie Reed returns today for follow-up of her left shoulder MRI.  She continues to have pain in the shoulder but overall slowly improving.  However she did have to catch her brother-in-law who had a fall last night and her shoulder pain has increased.  She is also complaining of some pain down her left arm and pain into her left jaw.  She has congestive heart failure.  Denies any orthopnea.  MRI left shoulder dated 02/07/2017: Showed a comminuted nondisplaced humeral neck fracture that extends into the greater tuberosity region.  This is a nonarticular fracture.  Rotator cuff without any apparent tears.  Type II acromioclavicular joint.  Glenohumeral joint is overall well-maintained.  I personally reviewed these images myself and also with the patient.  Physical exam: Forward flexion active and passive to approximately 170 degrees.  Left shoulder pain with impingement testing.  Impression: Left shoulder comminuted nondisplaced fracture  Plan: Patient is now approximately 7 weeks status post injury in which she sustained the left humerus fracture.  She is slowly trending towards improvement.  Therefore I have given her some shoulder exercises to perform at home no strengthening at this point in time.  Recommended no heavy lifting with the left arm.  In regards to her chest pain jaw pain and radicular pain down the left arm I advised her that she should go to the ER she would prefer to go and see her primary care physician therefore she will contact her primary care physician today.  She does not have a cardiologist as she is recently moved to the area.  She will follow-up with [place] in 1 month if she continues to have significant pain in the shoulder we will send her to formal physical therapy.

## 2017-03-22 ENCOUNTER — Ambulatory Visit (INDEPENDENT_AMBULATORY_CARE_PROVIDER_SITE_OTHER): Payer: Medicare Other

## 2017-03-22 ENCOUNTER — Ambulatory Visit (INDEPENDENT_AMBULATORY_CARE_PROVIDER_SITE_OTHER): Payer: Medicare Other | Admitting: Physician Assistant

## 2017-03-22 ENCOUNTER — Encounter (INDEPENDENT_AMBULATORY_CARE_PROVIDER_SITE_OTHER): Payer: Self-pay | Admitting: Physician Assistant

## 2017-03-22 DIAGNOSIS — M25512 Pain in left shoulder: Secondary | ICD-10-CM

## 2017-03-22 DIAGNOSIS — J310 Chronic rhinitis: Secondary | ICD-10-CM | POA: Insufficient documentation

## 2017-03-22 HISTORY — DX: Chronic rhinitis: J31.0

## 2017-03-22 NOTE — Progress Notes (Signed)
Office Visit Note   Patient: Bonnie Reed           Date of Birth: 10-29-1945           MRN: [id number] Visit Date: 03/22/2017              Requested by: Everrett Coombe, MD [address] [place], [place] [address] PCP: Everrett Coombe, MD   Assessment & Plan: Visit Diagnoses:  1. Left shoulder pain, unspecified chronicity     Plan: She will continue to work on her home exercise program is shown to mainly work on range of motion.  She will follow-up with [place] if she is unable to obtain full range of motion or if her condition becomes worse .  Follow-Up Instructions: Return if symptoms worsen or fail to improve.   Orders:  Orders Placed This Encounter  Procedures  . XR Shoulder Left   No orders of the defined types were placed in this encounter.     Procedures: No procedures performed   Clinical Data: No additional findings.   Subjective: Chief Complaint  Patient presents with  . Left Shoulder - Follow-up    HPI Ms. Kapral returns today follow-up of her left shoulder.  MRI dated 02/07/2017 showed a comminuted nondisplaced humeral neck fracture with extension into the greater tuberosity region.  She has been working on range of motion of the shoulder on her own at home.  States overall shoulder pain is dissipating.  She is happy with the progress she is made on her own.  States her pain is also becoming better each day.  Review of Systems No chest pain shortness breath fevers chills  Objective: Vital Signs: LMP 10/17/1991 (Exact Date)   Physical Exam  Constitutional: She is oriented to person, place, and time. She appears well-developed and well-nourished. No distress.  Pulmonary/Chest: Effort normal.  Neurological: She is alert and oriented to person, place, and time.  Skin: She is not diaphoretic.  Psychiatric: She has a normal mood and affect.    Ortho Exam Bilateral shoulder she has 5 out of 5 strength with external and internal rotation against  resistance.  Empty can test is negative bilaterally.  Left shoulder forward flexion approximately 100 6570 degrees active passively I can bring her close to 180 she lacks the last few degrees even with passive range of motion.  She has no pain with the range of motion of the shoulder today.  Tenderness over the proximal humeral shaft. Specialty Comments:  No specialty comments available.  Imaging: Xr Shoulder Left  Result Date: 03/22/2017 Left shoulder 3 views: Shoulders well located.  Fractures with good callus formation.  Overall excellent position alignment.  No other bony abnormalities.    PMFS History: Patient Active Problem List   Diagnosis Date Noted  . Left shoulder pain 01/27/2017  . Current moderate episode of major depressive disorder without prior episode ([place]) 11/04/2016  . Breast cancer ([place]) 10/16/2016  . Diabetes ([place]) 10/16/2016  . Hypertension 10/16/2016  . CHF (congestive heart failure) ([place]) 10/16/2016   Past Medical History:  Diagnosis Date  . Allergy   . Blood transfusion without reported diagnosis   . Cancer ([place])   . CHF (congestive heart failure) ([place])   . Diabetes mellitus without complication ([place])   . Hypertension     Family History  Problem Relation Age of Onset  . Stroke Mother   . Kidney disease Mother   . Cancer Father     Past Surgical  History:  Procedure Laterality Date  . APPENDECTOMY    . CESAREAN SECTION    . CHOLECYSTECTOMY    . TUBAL LIGATION     Social History   Occupational History  . Not on file  Tobacco Use  . Smoking status: Never Smoker  . Smokeless tobacco: Never Used  Substance and Sexual Activity  . Alcohol use: No  . Drug use: No  . Sexual activity: Not Currently

## 2017-04-12 ENCOUNTER — Telehealth: Payer: Self-pay | Admitting: Student in an Organized Health Care Education/Training Program

## 2017-04-12 NOTE — Telephone Encounter (Signed)
Patient left her Metformin behind when she went out of town.  She needs this medication refilled.  It goes to [facility] on [address]

## 2017-04-13 ENCOUNTER — Other Ambulatory Visit: Payer: Self-pay | Admitting: Student in an Organized Health Care Education/Training Program

## 2017-04-13 MED ORDER — METFORMIN HCL 500 MG PO TABS: 500.0000 mg | ORAL_TABLET | Freq: Every day | ORAL | 0 refills | Status: DC

## 2017-04-13 NOTE — Telephone Encounter (Signed)
PCLM for pt to call the office. If pt calls, please give her the info below. Ottis Stain, CMA

## 2017-04-13 NOTE — Progress Notes (Signed)
Patient left metformin when she went out of town. Refilled at [facility] per her request.

## 2017-04-13 NOTE — Telephone Encounter (Signed)
Refilled metformin at [facility].

## 2017-04-14 NOTE — Telephone Encounter (Signed)
Patient returned call and was informed that Metformin had been refilled. Patient also requested an appt for diabetic follow up. Appt made for 1/30 at 0830. Danley Danker, RN [facility] RN)

## 2017-04-21 ENCOUNTER — Ambulatory Visit (INDEPENDENT_AMBULATORY_CARE_PROVIDER_SITE_OTHER): Payer: Medicare Other | Admitting: Student in an Organized Health Care Education/Training Program

## 2017-04-21 ENCOUNTER — Encounter: Payer: Self-pay | Admitting: Student in an Organized Health Care Education/Training Program

## 2017-04-21 ENCOUNTER — Other Ambulatory Visit: Payer: Self-pay

## 2017-04-21 VITALS — BP 150/68 | HR 75 | Temp 98.1°F | Ht 61.0 in | Wt 181.8 lb

## 2017-04-21 DIAGNOSIS — Z1231 Encounter for screening mammogram for malignant neoplasm of breast: Secondary | ICD-10-CM

## 2017-04-21 DIAGNOSIS — E118 Type 2 diabetes mellitus with unspecified complications: Secondary | ICD-10-CM | POA: Diagnosis present

## 2017-04-21 DIAGNOSIS — Z0000 Encounter for general adult medical examination without abnormal findings: Secondary | ICD-10-CM

## 2017-04-21 DIAGNOSIS — Z1239 Encounter for other screening for malignant neoplasm of breast: Secondary | ICD-10-CM

## 2017-04-21 DIAGNOSIS — I10 Essential (primary) hypertension: Secondary | ICD-10-CM

## 2017-04-21 DIAGNOSIS — Z23 Encounter for immunization: Secondary | ICD-10-CM | POA: Diagnosis not present

## 2017-04-21 DIAGNOSIS — Z794 Long term (current) use of insulin: Secondary | ICD-10-CM

## 2017-04-21 HISTORY — DX: Encounter for general adult medical examination without abnormal findings: Z00.00

## 2017-04-21 LAB — POCT GLYCOSYLATED HEMOGLOBIN (HGB A1C): HEMOGLOBIN A1C: 9.1

## 2017-04-21 MED ORDER — VALSARTAN 160 MG PO TABS: 160.0000 mg | ORAL_TABLET | Freq: Every day | ORAL | 0 refills | Status: DC

## 2017-04-21 MED ORDER — ATORVASTATIN CALCIUM 40 MG PO TABS: 40.0000 mg | ORAL_TABLET | Freq: Every day | ORAL | 3 refills | Status: DC

## 2017-04-21 NOTE — Progress Notes (Signed)
CC: Diabetes follow up  HPI: Bonnie Reed is a 72 y.o. female .  HYPERTENSION Disease Monitoring: elevated today, patient thinks bc of sinus infection Blood pressure range- needs a new monitor  Chest pain, palpitations- none       Dyspnea- none Medications: HCTZ 25 mg daily, valsartan 160 mg, has not been taking valsartan Compliance- 100% reported  Lightheadedness,Syncope- denies    Edema- denies  DIABETES Disease Monitoring:  A1c 9.1 From 10.0 when tested 6 months ago Blood Sugar ranges - 158, 172 Polyuria/phagia/dipsia- +polydipsia       Visual problems- just had eyes checked Medications: humalog 15u BID, metformin 500 mg BID, lantus 56u QHS Compliance- skips lunch time humalog   Hypoglycemic symptoms- none  HYPERLIPIDEMIA Patient has not previously been managed on a Statin.  Monitoring Labs and Parameters Last A1C:  Lab Results  Component Value Date   HGBA1C 9.1 04/21/2017    Last Lipid:     Component Value Date/Time   CHOL 149 10/16/2016 0924   HDL 39 (L) 10/16/2016 0924    Last Bmet  Potassium  Date Value Ref Range Status  12/10/2016 3.6 3.5 - 5.1 mmol/L Final   Sodium  Date Value Ref Range Status  12/10/2016 139 135 - 145 mmol/L Final  10/16/2016 141 134 - 144 mmol/L Final   Creatinine, Ser  Date Value Ref Range Status  12/10/2016 1.05 (H) 0.44 - 1.00 mg/dL Final      Last BPs:  BP Readings from Last 3 Encounters:  04/21/17 (!) 150/68  12/31/16 (!) 167/93  12/10/16 (!) 156/73     Overdue health maintenance Colonoscopy, mammo, dexa scan information provided today. TDAP script provided. PCV13 and influenza vaccines given today.  Review of Symptoms:  See HPI for ROS.   CC, SH/smoking status, and VS noted.  Objective: BP (!) 150/68   Pulse 75   Temp 98.1 F (36.7 C) (Oral)   Ht 5\' 1"  (1.549 m)   Wt 181 lb 12.8 oz (82.5 kg)   LMP 10/17/1991 (Exact Date)   SpO2 96%   BMI 34.35 kg/m  GEN: NAD, alert, cooperative, and  pleasant. NECK: full ROM, no thyromegaly RESPIRATORY: clear to auscultation bilaterally with no wheezes, rhonchi or rales, good effort CV: RRR, no m/r/g, no peripheral edema GI: soft, non-tender, non-distended, no hepatosplenomegaly SKIN: warm and dry, no rashes or lesions NEURO: II-XII grossly intact, normal gait, peripheral sensation intact PSYCH: AAOx3, appropriate affect  Flu Vaccine: given today Tdap Vaccine: script provided today Pneumovax: given today   Assessment and plan:  Diabetes ([place]) A1c is trending down. Patient feels she can continue diet and exercise lifestyle modifications now that she recently graduated college at 72 years old.  - plan to continue current regimen - A1c goal 8.0 - recheck in 3 months - with ASCVD risk score >7.5% and diagnosis of diabetes, will start high intensity statin at today's visit  Hypertension BP elevated today. She has not been taking her valsartan, looks as though she only took it for one month in July. - continue HCTZ - restart valsartan - check BMP today, in 1 week  Healthcare maintenance - forms for colonoscopy, dexa, mammogram given  - PCV 13 given, will need 23 next year - influenza vaccine given - TDAP script given today   Orders Placed This Encounter  Procedures  . MM Digital Screening    Standing Status:   Future    Standing Expiration Date:   06/20/2018    Order  Specific Question:   Reason for Exam (SYMPTOM  OR DIAGNOSIS REQUIRED)    Answer:   breast cancer screening    Order Specific Question:   Preferred imaging location?    Answer:   [facility]  . Flu Vaccine QUAD 36+ mos IM  . Pneumococcal conjugate vaccine 13-valent IM  . Basic metabolic panel    Order Specific Question:   Has the patient fasted?    Answer:   No  . Lipid panel    Order Specific Question:   Has the patient fasted?    Answer:   No  . Basic metabolic panel    Standing Status:   Future    Standing Expiration Date:   04/21/2018  . HgB A1c     Meds ordered this encounter  Medications  . atorvastatin (LIPITOR) 40 MG tablet    Sig: Take 1 tablet (40 mg total) by mouth daily.    Dispense:  90 tablet    Refill:  3  . valsartan (DIOVAN) 160 MG tablet    Sig: Take 1 tablet (160 mg total) by mouth daily.    Dispense:  90 tablet    Refill:  0    Everrett Coombe, MD,MS,  PGY2 04/21/2017 10:21 AM

## 2017-04-21 NOTE — Assessment & Plan Note (Addendum)
BP elevated today. She has not been taking her valsartan, looks as though she only took it for one month in July. - continue HCTZ - restart valsartan - check BMP today, in 1 week

## 2017-04-21 NOTE — Progress Notes (Signed)
a1c

## 2017-04-21 NOTE — Assessment & Plan Note (Addendum)
A1c is trending down. Patient feels she can continue diet and exercise lifestyle modifications now that she recently graduated college at 72 years old.  - plan to continue current regimen - A1c goal 8.0 - recheck in 3 months - with ASCVD risk score >7.5% and diagnosis of diabetes, will start high intensity statin at today's visit

## 2017-04-21 NOTE — Assessment & Plan Note (Signed)
-   forms for colonoscopy, dexa, mammogram given  - PCV 13 given, will need 23 next year - influenza vaccine given - TDAP script given today

## 2017-04-21 NOTE — Patient Instructions (Signed)
It was a pleasure seeing you today in our clinic. Today we discussed diabetes, blood pressure, and health maintenance. Here is the treatment plan we have discussed and agreed upon together:  Diabetes Your A1c is 9.1 Your goal is to have an A1c < 8.5 Medicine Changes: None Homework: Continue to work on diet and exercise  Call us if: You experience hypoglycemic symptoms   Come back to see [place] in: 3 months  We started two new medications. Atorvastatin is for cholesterol and valsartan is for blood pressure.  Please return for a nurse visit to have your blood checked in one week.  Our clinic's number is [phone]. Please call with questions or concerns about what we discussed today.  Be well, Dr. Burr Medico

## 2017-04-22 LAB — BASIC METABOLIC PANEL
BUN/Creatinine Ratio: 20 (ref 12–28)
BUN: 17 mg/dL (ref 8–27)
CHLORIDE: 104 mmol/L (ref 96–106)
CO2: 21 mmol/L (ref 20–29)
CREATININE: 0.84 mg/dL (ref 0.57–1.00)
Calcium: 9.7 mg/dL (ref 8.7–10.3)
GFR calc Af Amer: 81 mL/min/{1.73_m2} (ref >59)
GFR calc non Af Amer: 71 mL/min/{1.73_m2} (ref >59)
GLUCOSE: 44 mg/dL — AB (ref 65–99)
Potassium: 3.9 mmol/L (ref 3.5–5.2)
Sodium: 143 mmol/L (ref 134–144)

## 2017-04-22 LAB — LIPID PANEL
CHOLESTEROL TOTAL: 156 mg/dL (ref 100–199)
Chol/HDL Ratio: 3.3 ratio (ref 0.0–4.4)
HDL: 48 mg/dL (ref >39)
LDL CALC: 92 mg/dL (ref 0–99)
TRIGLYCERIDES: 78 mg/dL (ref 0–149)
VLDL CHOLESTEROL CAL: 16 mg/dL (ref 5–40)

## 2017-04-28 ENCOUNTER — Other Ambulatory Visit: Payer: Medicare Other

## 2017-04-28 DIAGNOSIS — I10 Essential (primary) hypertension: Secondary | ICD-10-CM

## 2017-04-29 LAB — BASIC METABOLIC PANEL
BUN / CREAT RATIO: 20 (ref 12–28)
BUN: 16 mg/dL (ref 8–27)
CHLORIDE: 105 mmol/L (ref 96–106)
CO2: 19 mmol/L — ABNORMAL LOW (ref 20–29)
Calcium: 9.5 mg/dL (ref 8.7–10.3)
Creatinine, Ser: 0.81 mg/dL (ref 0.57–1.00)
GFR calc Af Amer: 85 mL/min/{1.73_m2} (ref >59)
GFR calc non Af Amer: 74 mL/min/{1.73_m2} (ref >59)
GLUCOSE: 119 mg/dL — AB (ref 65–99)
Potassium: 4.1 mmol/L (ref 3.5–5.2)
SODIUM: 140 mmol/L (ref 134–144)

## 2017-06-11 ENCOUNTER — Encounter: Payer: Self-pay | Admitting: Student in an Organized Health Care Education/Training Program

## 2017-06-11 ENCOUNTER — Ambulatory Visit (HOSPITAL_COMMUNITY)
Admission: RE | Admit: 2017-06-11 | Discharge: 2017-06-11 | Disposition: A | Discharge status: Home / Self Care | Payer: Medicare Other | Source: Ambulatory Visit | Attending: Family Medicine | Admitting: Family Medicine

## 2017-06-11 ENCOUNTER — Other Ambulatory Visit: Payer: Self-pay

## 2017-06-11 ENCOUNTER — Ambulatory Visit (INDEPENDENT_AMBULATORY_CARE_PROVIDER_SITE_OTHER): Payer: Medicare Other | Admitting: Student in an Organized Health Care Education/Training Program

## 2017-06-11 DIAGNOSIS — J019 Acute sinusitis, unspecified: Secondary | ICD-10-CM

## 2017-06-11 DIAGNOSIS — J0140 Acute pansinusitis, unspecified: Secondary | ICD-10-CM

## 2017-06-11 DIAGNOSIS — R0600 Dyspnea, unspecified: Secondary | ICD-10-CM | POA: Diagnosis present

## 2017-06-11 DIAGNOSIS — J984 Other disorders of lung: Secondary | ICD-10-CM | POA: Insufficient documentation

## 2017-06-11 DIAGNOSIS — I700 Atherosclerosis of aorta: Secondary | ICD-10-CM | POA: Diagnosis not present

## 2017-06-11 DIAGNOSIS — J0141 Acute recurrent pansinusitis: Secondary | ICD-10-CM | POA: Insufficient documentation

## 2017-06-11 DIAGNOSIS — R062 Wheezing: Secondary | ICD-10-CM | POA: Diagnosis present

## 2017-06-11 HISTORY — DX: Acute pansinusitis, unspecified: J01.40

## 2017-06-11 MED ORDER — DOXYCYCLINE HYCLATE 100 MG PO TABS: 100.0000 mg | ORAL_TABLET | Freq: Two times a day (BID) | ORAL | 0 refills | Status: AC

## 2017-06-11 NOTE — Assessment & Plan Note (Addendum)
With 3 weeks of URI symptoms including chills, facial pain, ear pain and dyspnea will go ahead and treat for an acute sinusitis. - doxycycline (VIBRA-TABS) 100 MG tablet; Take 1 tablet (100 mg total) by mouth 2 (two) times daily for 5 days.  Dispense: 10 tablet; Refill: 0 - DG Chest 2 View; ordered to rule out PNA given duration of symptoms and exam with scattered wheezes. (She is well-appearing and satting well in the office) - doxy will cover for PNA however in the unlikely event that she has a large lobar PNA we may have to narrow abx - follow up precautions provided including worsening symptoms, would expect cough to last up to 6 weeks - continue OTC remedies including flonase and mucinex to help congestion drain

## 2017-06-11 NOTE — Progress Notes (Addendum)
Subjective:    Bonnie Reed - 72 y.o. female MRN [id number]  Date of birth: 1945-04-11  HPI  Bonnie Reed is here for chills and URI symptoms for 3 weeks.  URI Major symptoms: Pressure in her face, congestion, cough. Eyes are irritated.  Has been sick for 3 weeks. Progression: Does not seem like symptoms have getting better. Medications tried: Flonase, mucinex, alka selzer Sick contacts: No sick contacts Patient believes may be caused by a sinus infection  Symptoms Fever: no fevers Headache or face pain: face pain Tooth pain: none Sneezing: yes Scratchy throat: yes Allergies: she does have a history of allergies Muscle aches: yes Severe fatigue: yes Stiff neck: no Shortness of breath: yes Rash: none Sore throat or swollen glands: yes   ROS see HPI Smoking Status noted   Health Maintenance:  Health Maintenance Due  Topic Date Due  . Hepatitis C Screening  27-Apr-1945  . FOOT EXAM  06/15/1956  . OPHTHALMOLOGY EXAM  06/15/1956  . TETANUS/TDAP  06/15/1965  . MAMMOGRAM  06/15/1996  . COLONOSCOPY  06/15/1996  . DEXA SCAN  06/16/2011    -  reports that she has never smoked. She has never used smokeless tobacco. - Review of Systems: Per HPI. - Past Medical History: Patient Active Problem List   Diagnosis Date Noted  . Acute sinus infection 06/11/2017  . Healthcare maintenance 04/21/2017  . Left shoulder pain 01/27/2017  . Current moderate episode of major depressive disorder without prior episode ([place]) 11/04/2016  . Breast cancer ([place]) 10/16/2016  . Diabetes ([place]) 10/16/2016  . Hypertension 10/16/2016  . CHF (congestive heart failure) ([place]) 10/16/2016   - Medications: reviewed and updated Current Outpatient Medications  Medication Sig Dispense Refill  . acetaminophen (TYLENOL) 500 MG tablet Take 1,000 mg by mouth daily as needed for mild pain.    Marland Kitchen aspirin EC 81 MG tablet Take 81 mg by mouth daily.    Marland Kitchen atorvastatin (LIPITOR) 40 MG tablet Take 1  tablet (40 mg total) by mouth daily. 90 tablet 3  . diphenhydrAMINE (BENADRYL) 25 mg capsule Take 25 mg by mouth daily as needed for allergies.    Marland Kitchen doxycycline (VIBRA-TABS) 100 MG tablet Take 1 tablet (100 mg total) by mouth 2 (two) times daily for 5 days. 10 tablet 0  . EPINEPHrine 0.3 mg/0.3 mL IJ SOAJ injection Inject 0.3 mg into the muscle.    . fexofenadine (ALLEGRA) 180 MG tablet Take 180 mg by mouth.    . hydrochlorothiazide (HYDRODIURIL) 25 MG tablet Take 1 tablet (25 mg total) by mouth daily. 90 tablet 3  . insulin glargine (LANTUS) 100 UNIT/ML injection Inject 0.56 mLs (56 Units total) into the skin at bedtime. 10 mL 11  . insulin lispro (HUMALOG) 100 UNIT/ML injection Inject 0.16 mLs (16 Units total) into the skin 3 (three) times daily before meals. 10 mL 11  . metFORMIN (GLUCOPHAGE) 500 MG tablet Take 1 tablet (500 mg total) by mouth daily. 90 tablet 0  . montelukast (SINGULAIR) 10 MG tablet Take 10 mg by mouth daily.    . nabumetone (RELAFEN) 500 MG tablet Take 1 tablet (500 mg total) by mouth 2 (two) times daily as needed. 60 tablet 0  . sodium chloride (OCEAN) 0.65 % nasal spray Place 1 spray into the nose daily as needed for congestion.    . traMADol (ULTRAM) 50 MG tablet Take 1-2 tablets (50-100 mg total) by mouth every 6 (six) hours as needed. 60 tablet 0  .  valsartan (DIOVAN) 160 MG tablet Take 1 tablet (160 mg total) by mouth daily. 90 tablet 0   No current facility-administered medications for this visit.     Review of Systems See HPI     Objective:   Physical Exam BP (!) 142/72   Pulse 82   Temp 97.9 F (36.6 C) (Oral)   Ht 5\' 1"  (1.549 m)   Wt 183 lb (83 kg)   LMP 10/17/1991 (Exact Date)   SpO2 98%   BMI 34.58 kg/m  Gen: NAD, alert, cooperative with exam, well-appearing HEENT: NCAT, PERRL, clear conjunctiva, +mild pharyngeal erythema without exudate, supple neck, +fluid noted behind each TM, no bulging TM CV: RRR, good S1/S2, no murmur, no edema, capillary  refill brisk  Resp: +scattered wheezes heard throughout, light, comfortable WOB Abd: SNTND, BS present, no guarding or organomegaly Skin: no rashes, normal turgor  Neuro: no gross deficits.  Psych: good insight, alert and oriented    Assessment & Plan:   Acute sinus infection With 3 weeks of URI symptoms including chills, facial pain, ear pain and dyspnea will go ahead and treat for an acute sinusitis. - doxycycline (VIBRA-TABS) 100 MG tablet; Take 1 tablet (100 mg total) by mouth 2 (two) times daily for 5 days.  Dispense: 10 tablet; Refill: 0 - DG Chest 2 View; ordered to rule out PNA given duration of symptoms and exam with scattered wheezes. (She is well-appearing and satting well in the office) - doxy will cover for PNA however in the unlikely event that she has a large lobar PNA we may have to narrow abx - follow up precautions provided including worsening symptoms, would expect cough to last up to 6 weeks - continue OTC remedies including flonase and mucinex to help congestion drain   Meds ordered this encounter  Medications  . doxycycline (VIBRA-TABS) 100 MG tablet    Sig: Take 1 tablet (100 mg total) by mouth 2 (two) times daily for 5 days.    Dispense:  10 tablet    Refill:  0   Everrett Coombe, MD,MS,  PGY2 06/11/2017 10:34 AM

## 2017-06-11 NOTE — Patient Instructions (Signed)
It was a pleasure seeing you today in our clinic. Today we discussed your symptoms. Here is the treatment plan we have discussed and agreed upon together:  We drew ordered a chest x ray at this visit which can be done right at [facility]  I will call or send you a letter with these results. If you do not hear from me within the next week, please give our office a call.   Our clinic's number is [phone]. Please call with questions or concerns about what we discussed today.  Be well, Dr. Burr Medico

## 2017-07-07 ENCOUNTER — Ambulatory Visit (INDEPENDENT_AMBULATORY_CARE_PROVIDER_SITE_OTHER): Payer: Medicare Other | Admitting: Physician Assistant

## 2017-07-07 ENCOUNTER — Ambulatory Visit (INDEPENDENT_AMBULATORY_CARE_PROVIDER_SITE_OTHER): Payer: Medicare Other

## 2017-07-07 ENCOUNTER — Encounter (INDEPENDENT_AMBULATORY_CARE_PROVIDER_SITE_OTHER): Payer: Self-pay | Admitting: Physician Assistant

## 2017-07-07 DIAGNOSIS — M542 Cervicalgia: Secondary | ICD-10-CM

## 2017-07-07 MED ORDER — CYCLOBENZAPRINE HCL 10 MG PO TABS: 10.0000 mg | ORAL_TABLET | Freq: Every day | ORAL | 1 refills | Status: DC

## 2017-07-07 NOTE — Progress Notes (Signed)
Office Visit Note   Patient: Bonnie Reed           Date of Birth: 1945-09-16           MRN: [id number] Visit Date: 07/07/2017              Requested by: Everrett Coombe, MD [address] [place], [place] [address] PCP: Everrett Coombe, MD   Assessment & Plan: Visit Diagnoses:  1. Neck pain     Plan: We will send her to physical therapy for both her neck and her left shoulder.  They are to include range of motion strengthening and modalities.  They are to  teacher her a home exercise program.  We will see her back in a month to check her progress lack time.  Did give her Flexeril to take at night for her neck.  She is to apply moist heat to the neck.  Regards to her recent chest pain and orthopnea that was approximately 2 weeks ago now like for her to see her primary care physician for this as soon as possible.  If it happens again she needs to go to the emergency room.  Follow-Up Instructions: Return in about 1 month (around 08/04/2017).   Orders:  Orders Placed This Encounter  Procedures  . XR Cervical Spine 2 or 3 views   Meds ordered this encounter  Medications  . cyclobenzaprine (FLEXERIL) 10 MG tablet    Sig: Take 1 tablet (10 mg total) by mouth at bedtime.    Dispense:  30 tablet    Refill:  1      Procedures: No procedures performed   Clinical Data: No additional findings.   Subjective: Chief Complaint  Patient presents with  . Left Shoulder - Pain    HPI Ms. Weich comes in today due to left shoulder and neck pain.  She states that her pain starts in her neck and radiates down into her shoulders.  Mainly in the left shoulder does awaken her at night.  She has popping cracking in the neck.  There is some numbness down into the left humerus area.  She is tried over-the-counter meds like Aleve without any real relief.  Said no particular injury to the neck.  She does state that she had some chest pain last week and what sounds like some orthopnea.  She denies  any gastric reflux.  She has had no chest pain this week. Review of Systems Denies chills, nausea or vomiting.  No acute fever please see HPI otherwise negative  Objective: Vital Signs: LMP 10/17/1991 (Exact Date)   Physical Exam  Constitutional: She is oriented to person, place, and time. She appears well-developed and well-nourished. No distress.  Pulmonary/Chest: Effort normal.  Neurological: She is alert and oriented to person, place, and time.  Skin: She is not diaphoretic.  Psychiatric: She has a normal mood and affect.    Ortho Exam Upper extremity she has 5 out of 5 strength throughout against resistance.  Sensation intact bilateral hands to light touch.  She has full motor both hands.  Cervical spine she has discomfort with flexion and rotation left and right.  Positive Spurling's.  She has tenderness over the lower cervical spine and over the medial border of the left scapula.  She has good range of motion of the left shoulder positive impingement no weakness with external and internal rotation against resistance bilateral shoulders.  Radial pulses are intact bilaterally. Specialty Comments:  No specialty comments available.  Imaging: Xr Cervical Spine 2 Or 3 Views  Result Date: 07/07/2017 Cervical spine 2 views.  Disc space all well maintained.  No acute fractures.  Loss of normal lordotic curvature.  Endplate spurring at multiple levels.  No spondylolisthesis.    PMFS History: Patient Active Problem List   Diagnosis Date Noted  . Acute sinus infection 06/11/2017  . Healthcare maintenance 04/21/2017  . Left shoulder pain 01/27/2017  . Current moderate episode of major depressive disorder without prior episode ([place]) 11/04/2016  . Breast cancer ([place]) 10/16/2016  . Diabetes ([place]) 10/16/2016  . Hypertension 10/16/2016  . CHF (congestive heart failure) ([place]) 10/16/2016   Past Medical History:  Diagnosis Date  . Allergy   . Blood transfusion without reported diagnosis     . Cancer ([place])   . CHF (congestive heart failure) ([place])   . Diabetes mellitus without complication ([place])   . Hypertension     Family History  Problem Relation Age of Onset  . Stroke Mother   . Kidney disease Mother   . Cancer Father     Past Surgical History:  Procedure Laterality Date  . APPENDECTOMY    . CESAREAN SECTION    . CHOLECYSTECTOMY    . TUBAL LIGATION     Social History   Occupational History  . Not on file  Tobacco Use  . Smoking status: Never Smoker  . Smokeless tobacco: Never Used  Substance and Sexual Activity  . Alcohol use: No  . Drug use: No  . Sexual activity: Not Currently

## 2017-08-04 ENCOUNTER — Ambulatory Visit (INDEPENDENT_AMBULATORY_CARE_PROVIDER_SITE_OTHER): Payer: Medicare Other | Admitting: Physician Assistant

## 2017-08-14 ENCOUNTER — Other Ambulatory Visit: Payer: Self-pay

## 2017-08-14 ENCOUNTER — Emergency Department (HOSPITAL_BASED_OUTPATIENT_CLINIC_OR_DEPARTMENT_OTHER): Payer: Medicare Other

## 2017-08-14 ENCOUNTER — Encounter (HOSPITAL_BASED_OUTPATIENT_CLINIC_OR_DEPARTMENT_OTHER): Payer: Self-pay | Admitting: Emergency Medicine

## 2017-08-14 ENCOUNTER — Emergency Department (HOSPITAL_BASED_OUTPATIENT_CLINIC_OR_DEPARTMENT_OTHER)
Admission: EM | Admit: 2017-08-14 | Discharge: 2017-08-14 | Disposition: A | Discharge status: Home / Self Care | Payer: Medicare Other | Attending: Emergency Medicine | Admitting: Emergency Medicine

## 2017-08-14 DIAGNOSIS — I951 Orthostatic hypotension: Secondary | ICD-10-CM | POA: Diagnosis not present

## 2017-08-14 DIAGNOSIS — I110 Hypertensive heart disease with heart failure: Secondary | ICD-10-CM | POA: Insufficient documentation

## 2017-08-14 DIAGNOSIS — I509 Heart failure, unspecified: Secondary | ICD-10-CM | POA: Insufficient documentation

## 2017-08-14 DIAGNOSIS — N3001 Acute cystitis with hematuria: Secondary | ICD-10-CM

## 2017-08-14 DIAGNOSIS — Z79899 Other long term (current) drug therapy: Secondary | ICD-10-CM | POA: Insufficient documentation

## 2017-08-14 DIAGNOSIS — E119 Type 2 diabetes mellitus without complications: Secondary | ICD-10-CM | POA: Insufficient documentation

## 2017-08-14 DIAGNOSIS — M549 Dorsalgia, unspecified: Secondary | ICD-10-CM | POA: Diagnosis present

## 2017-08-14 DIAGNOSIS — E860 Dehydration: Secondary | ICD-10-CM | POA: Insufficient documentation

## 2017-08-14 DIAGNOSIS — Z794 Long term (current) use of insulin: Secondary | ICD-10-CM | POA: Insufficient documentation

## 2017-08-14 DIAGNOSIS — Z7982 Long term (current) use of aspirin: Secondary | ICD-10-CM | POA: Insufficient documentation

## 2017-08-14 DIAGNOSIS — R319 Hematuria, unspecified: Secondary | ICD-10-CM

## 2017-08-14 LAB — COMPREHENSIVE METABOLIC PANEL
ALBUMIN: 3.5 g/dL (ref 3.5–5.0)
ALK PHOS: 174 U/L — AB (ref 38–126)
ALT: 19 U/L (ref 14–54)
AST: 24 U/L (ref 15–41)
Anion gap: 9 (ref 5–15)
BILIRUBIN TOTAL: 0.4 mg/dL (ref 0.3–1.2)
BUN: 16 mg/dL (ref 6–20)
CO2: 22 mmol/L (ref 22–32)
CREATININE: 0.62 mg/dL (ref 0.44–1.00)
Calcium: 8.7 mg/dL — ABNORMAL LOW (ref 8.9–10.3)
Chloride: 107 mmol/L (ref 101–111)
GFR calc Af Amer: >60 mL/min (ref >60)
GLUCOSE: 149 mg/dL — AB (ref 65–99)
POTASSIUM: 3.7 mmol/L (ref 3.5–5.1)
Sodium: 138 mmol/L (ref 135–145)
TOTAL PROTEIN: 6.7 g/dL (ref 6.5–8.1)

## 2017-08-14 LAB — CBC WITH DIFFERENTIAL/PLATELET
BASOS PCT: 0 %
Basophils Absolute: 0 10*3/uL (ref 0.0–0.1)
EOS ABS: 0.4 10*3/uL (ref 0.0–0.7)
EOS PCT: 3 %
HEMATOCRIT: 38.1 % (ref 36.0–46.0)
HEMOGLOBIN: 12.9 g/dL (ref 12.0–15.0)
LYMPHS PCT: 22 %
Lymphs Abs: 2.7 10*3/uL (ref 0.7–4.0)
MCH: 23.1 pg — AB (ref 26.0–34.0)
MCHC: 33.9 g/dL (ref 30.0–36.0)
MCV: 68.3 fL — AB (ref 78.0–100.0)
Monocytes Absolute: 1.5 10*3/uL — ABNORMAL HIGH (ref 0.1–1.0)
Monocytes Relative: 12 %
NEUTROS PCT: 63 %
Neutro Abs: 7.5 10*3/uL (ref 1.7–7.7)
Platelets: 316 10*3/uL (ref 150–400)
RBC: 5.58 MIL/uL — ABNORMAL HIGH (ref 3.87–5.11)
RDW: 16.1 % — ABNORMAL HIGH (ref 11.5–15.5)
WBC: 12.1 10*3/uL — ABNORMAL HIGH (ref 4.0–10.5)

## 2017-08-14 LAB — URINALYSIS, ROUTINE W REFLEX MICROSCOPIC
Bilirubin Urine: NEGATIVE
Glucose, UA: NEGATIVE mg/dL
KETONES UR: NEGATIVE mg/dL
NITRITE: NEGATIVE
PROTEIN: NEGATIVE mg/dL
Specific Gravity, Urine: >1.03 — ABNORMAL HIGH (ref 1.005–1.030)
pH: 5.5 (ref 5.0–8.0)

## 2017-08-14 LAB — URINALYSIS, MICROSCOPIC (REFLEX)

## 2017-08-14 LAB — TROPONIN I: Troponin I: <0.03 ng/mL (ref <0.03)

## 2017-08-14 LAB — LIPASE, BLOOD: LIPASE: 35 U/L (ref 11–51)

## 2017-08-14 MED ORDER — SODIUM CHLORIDE 0.9 % IV BOLUS
1000.0000 mL | Freq: Once | INTRAVENOUS | Status: AC
  Administered 2017-08-14: 1000 mL via INTRAVENOUS

## 2017-08-14 MED ORDER — CEPHALEXIN 250 MG PO CAPS
500.0000 mg | ORAL_CAPSULE | Freq: Once | ORAL | Status: AC
  Administered 2017-08-14: 500 mg via ORAL
  Filled 2017-08-14: qty 2

## 2017-08-14 MED ORDER — CEPHALEXIN 500 MG PO CAPS: 500.0000 mg | ORAL_CAPSULE | Freq: Four times a day (QID) | ORAL | 0 refills | Status: DC

## 2017-08-14 MED ORDER — ONDANSETRON HCL 4 MG/2ML IJ SOLN
4.0000 mg | Freq: Once | INTRAMUSCULAR | Status: AC
  Administered 2017-08-14: 4 mg via INTRAVENOUS
  Filled 2017-08-14: qty 2

## 2017-08-14 NOTE — ED Notes (Signed)
Pt ambulated without difficulty to BR with stand by assist

## 2017-08-14 NOTE — ED Provider Notes (Signed)
[facility] EMERGENCY DEPARTMENT Provider Note   CSN: [id number] Arrival date & time: 08/14/17  1303     History   Chief Complaint Chief Complaint  Patient presents with  . Back Pain    HPI Bonnie Reed is a 72 y.o. female.  Pt presents to the ED today with back pain, dizziness with standing, blood in urine, and foul smelling urine.  Pt said she's had back pain for the past 3 days.  She has noticed blood in her urine after she urinates.  No blood in stool.  No blood in underwear when she is not urinating.  She said she's been feeling numb and dizzy when she stands up.  No f/c.  Pt has never felt anything like this in the past.       Past Medical History:  Diagnosis Date  . Allergy   . Blood transfusion without reported diagnosis   . Cancer ([place])   . CHF (congestive heart failure) ([place])   . Diabetes mellitus without complication ([place])   . Hypertension     Patient Active Problem List   Diagnosis Date Noted  . Acute sinus infection 06/11/2017  . Healthcare maintenance 04/21/2017  . Left shoulder pain 01/27/2017  . Current moderate episode of major depressive disorder without prior episode ([place]) 11/04/2016  . Breast cancer ([place]) 10/16/2016  . Diabetes ([place]) 10/16/2016  . Hypertension 10/16/2016  . CHF (congestive heart failure) ([place]) 10/16/2016    Past Surgical History:  Procedure Laterality Date  . APPENDECTOMY    . CESAREAN SECTION    . CHOLECYSTECTOMY    . TUBAL LIGATION       OB History   None      Home Medications    Prior to Admission medications   Medication Sig Start Date End Date Taking? Authorizing Provider  acetaminophen (TYLENOL) 500 MG tablet Take 1,000 mg by mouth daily as needed for mild pain.   Yes [provider]  aspirin EC 81 MG tablet Take 81 mg by mouth daily.   Yes [provider]  diphenhydrAMINE (BENADRYL) 25 mg capsule Take 25 mg by mouth daily as needed for allergies.   Yes [provider]  EPINEPHrine 0.3 mg/0.3 mL IJ SOAJ injection Inject 0.3 mg into the muscle. 12/07/14  Yes [provider]  fexofenadine (ALLEGRA) 180 MG tablet Take 180 mg by mouth. 09/08/16 09/08/17 Yes [provider]  insulin glargine (LANTUS) 100 UNIT/ML injection Inject 0.56 mLs (56 Units total) into the skin at bedtime. 10/16/16  Yes Everrett Coombe, MD  insulin lispro (HUMALOG) 100 UNIT/ML injection Inject 0.16 mLs (16 Units total) into the skin 3 (three) times daily before meals. 10/16/16  Yes Everrett Coombe, MD  metFORMIN (GLUCOPHAGE) 500 MG tablet Take 1 tablet (500 mg total) by mouth daily. 04/13/17  Yes Everrett Coombe, MD  sodium chloride (OCEAN) 0.65 % nasal spray Place 1 spray into the nose daily as needed for congestion.   Yes [provider]  valsartan (DIOVAN) 160 MG tablet Take 1 tablet (160 mg total) by mouth daily. 04/21/17  Yes Everrett Coombe, MD  atorvastatin (LIPITOR) 40 MG tablet Take 1 tablet (40 mg total) by mouth daily. 04/21/17   Everrett Coombe, MD  cephALEXin (KEFLEX) 500 MG capsule Take 1 capsule (500 mg total) by mouth 4 (four) times daily. 08/14/17   Isla Pence, MD  cyclobenzaprine (FLEXERIL) 10 MG tablet Take 1 tablet (10 mg total) by mouth at bedtime. 07/07/17   Carlis Abbott,  Gillermo Murdoch, PA-C  montelukast (SINGULAIR) 10 MG tablet Take 10 mg by mouth daily. 09/08/16   [provider]  nabumetone (RELAFEN) 500 MG tablet Take 1 tablet (500 mg total) by mouth 2 (two) times daily as needed. 01/13/17   Mcarthur Rossetti, MD    Family History Family History  Problem Relation Age of Onset  . Stroke Mother   . Kidney disease Mother   . Cancer Father     Social History Social History   Tobacco Use  . Smoking status: Never Smoker  . Smokeless tobacco: Never Used  Substance Use Topics  . Alcohol use: No  . Drug use: No     Allergies   Chlorpheniramine; Other; and Phenylpropanol   Review of Systems Review of Systems  Genitourinary: Positive for  hematuria.  Musculoskeletal: Positive for back pain.  Neurological: Positive for numbness.  All other systems reviewed and are negative.    Physical Exam Updated Vital Signs BP (!) 169/90   Pulse 80   Temp 98.4 F (36.9 C) (Oral)   Resp (!) 22   Ht 5\' 2"  (1.575 m)   Wt 81.6 kg (180 lb)   LMP 10/17/1991 (Exact Date)   SpO2 100%   BMI 32.92 kg/m   Physical Exam  Constitutional: She is oriented to person, place, and time. She appears well-developed and well-nourished.  HENT:  Head: Normocephalic and atraumatic.  Right Ear: External ear normal.  Left Ear: External ear normal.  Nose: Nose normal.  Mouth/Throat: Oropharynx is clear and moist.  Eyes: Pupils are equal, round, and reactive to light. Conjunctivae and EOM are normal.  Neck: Normal range of motion. Neck supple.  Cardiovascular: Normal rate, regular rhythm, normal heart sounds and intact distal pulses.  Pulmonary/Chest: Effort normal and breath sounds normal.  Abdominal: Soft. Bowel sounds are normal.  Musculoskeletal: Normal range of motion.  Neurological: She is alert and oriented to person, place, and time.  Skin: Skin is warm and dry. Capillary refill takes less than 2 seconds.  Psychiatric: She has a normal mood and affect. Her behavior is normal. Judgment and thought content normal.  Nursing note and vitals reviewed.    ED Treatments / Results  Labs (all labs ordered are listed, but only abnormal results are displayed) Labs Reviewed  URINALYSIS, ROUTINE W REFLEX MICROSCOPIC - Abnormal; Notable for the following components:      Result Value   Specific Gravity, Urine >1.030 (*)    Hgb urine dipstick LARGE (*)    Leukocytes, UA TRACE (*)    All other components within normal limits  URINALYSIS, MICROSCOPIC (REFLEX) - Abnormal; Notable for the following components:   Bacteria, UA FEW (*)    All other components within normal limits  CBC WITH DIFFERENTIAL/PLATELET - Abnormal; Notable for the following  components:   WBC 12.1 (*)    RBC 5.58 (*)    MCV 68.3 (*)    MCH 23.1 (*)    RDW 16.1 (*)    Monocytes Absolute 1.5 (*)    All other components within normal limits  COMPREHENSIVE METABOLIC PANEL - Abnormal; Notable for the following components:   Glucose, Bld 149 (*)    Calcium 8.7 (*)    Alkaline Phosphatase 174 (*)    All other components within normal limits  URINE CULTURE  LIPASE, BLOOD  TROPONIN I    EKG EKG Interpretation  Date/Time:  Saturday Aug 14 2017 15:11:43 EDT Ventricular Rate:  71 PR Interval:    QRS  Duration: 108 QT Interval:  414 QTC Calculation: 450 R Axis:   -50 Text Interpretation:  Sinus rhythm Left anterior fascicular block RSR' in V1 or V2, right VCD or RVH Probable left ventricular hypertrophy Confirmed by Isla Pence [phone]) on 08/14/2017 3:31:17 PM Also confirmed by Isla Pence [phone]), editor Philomena Doheny [phone])  on 08/14/2017 4:40:24 PM   Radiology Ct Renal Stone Study  Result Date: 08/14/2017 CLINICAL DATA:  Left flank pain, back pain and vaginal spotting. EXAM: CT ABDOMEN AND PELVIS WITHOUT CONTRAST TECHNIQUE: Multidetector CT imaging of the abdomen and pelvis was performed following the standard protocol without IV contrast. COMPARISON:  12/10/2016 FINDINGS: Lower chest: Chronic bronchiectasis, left greater than right lung base. Small hiatal hernia. Hepatobiliary: No focal liver abnormality is seen. Status post cholecystectomy. No biliary dilatation. Pancreas: Unremarkable. No pancreatic ductal dilatation or surrounding inflammatory changes. Spleen: Normal in size without focal abnormality. Adrenals/Urinary Tract: Adrenal glands are unremarkable. Kidneys are normal, without renal calculi, focal lesion, or hydronephrosis. Bladder is unremarkable. Stomach/Bowel: Stomach is within normal limits. Post appendectomy. No evidence of bowel wall thickening, distention, or inflammatory changes. Scattered colonic diverticulosis without evidence of  diverticulitis. Vascular/Lymphatic: Aortic atherosclerosis. No enlarged abdominal or pelvic lymph nodes. Reproductive: Leiomyomatous uterus. Other: No abdominal wall hernia or abnormality. No abdominopelvic ascites. Musculoskeletal: Osteopenia, particularly prominent in the pelvis. Spondylosis of the lower lumbosacral spine. Previous right hip arthroplasty with intact hardware. IMPRESSION: No evidence of acute abnormalities within the abdomen or pelvis. Scattered diverticulosis without evidence of diverticulitis. Small hiatal hernia. Leiomyomatous uterus. Chronic bilateral lower lobe bronchiectasis. Osteopenia, particularly prominent in pelvis. Calcific atherosclerotic disease of the aorta. Electronically Signed   By: Fidela Salisbury M.D.   On: 08/14/2017 16:17    Procedures Procedures (including critical care time)  Medications Ordered in ED Medications  cephALEXin (KEFLEX) capsule 500 mg (has no administration in time range)  ondansetron (ZOFRAN) injection 4 mg (4 mg Intravenous Given 08/14/17 1534)  sodium chloride 0.9 % bolus 1,000 mL (0 mLs Intravenous Stopped 08/14/17 1640)  sodium chloride 0.9 % bolus 1,000 mL (1,000 mLs Intravenous New Bag/Given 08/14/17 1656)     Initial Impression / Assessment and Plan / ED Course  I have reviewed the triage vital signs and the nursing notes.  Pertinent labs & imaging results that were available during my care of the patient were reviewed by me and considered in my medical decision making (see chart for details).    Pt is feeling much better after IVFs.  She has been able to ambulate without feeling dizzy.  She is symptomatic for a uti, so will be treated with keflex.  She is told that she needs to f/u with urology if blood does not go away with uti tx.  Return if worse.  Final Clinical Impressions(s) / ED Diagnoses   Final diagnoses:  Hematuria, unspecified type  Acute cystitis with hematuria  Dehydration  Orthostatic hypotension    ED  Discharge Orders        Ordered    cephALEXin (KEFLEX) 500 MG capsule  4 times daily     08/14/17 1755       Isla Pence, MD 08/14/17 1757

## 2017-08-14 NOTE — ED Triage Notes (Signed)
Patient states that she has had pain to her lower back x 3 days and is having numbness in her both her hands and feet. The patient also states that yesterday she started to "spot" and her urine smells "foul"

## 2017-08-14 NOTE — Discharge Instructions (Addendum)
If the blood does not go away with treatment of UTI, you will need to make an appointment with urology for a cystoscopy.

## 2017-08-14 NOTE — ED Notes (Signed)
Patient transported to CT and returned 

## 2017-08-15 LAB — URINE CULTURE

## 2017-08-31 ENCOUNTER — Other Ambulatory Visit: Payer: Self-pay | Admitting: Student in an Organized Health Care Education/Training Program

## 2017-08-31 NOTE — Telephone Encounter (Signed)
I am covering Dr. Ernestina Penna inbox. It appears that this patient is overdue for diabetes recheck. Could you please schedule with Dr. Burr Medico or white team to recheck dM?

## 2017-09-06 ENCOUNTER — Other Ambulatory Visit: Payer: Self-pay | Admitting: Student in an Organized Health Care Education/Training Program

## 2017-09-07 ENCOUNTER — Other Ambulatory Visit: Payer: Self-pay | Admitting: Student in an Organized Health Care Education/Training Program

## 2017-10-18 ENCOUNTER — Encounter: Payer: Self-pay | Admitting: Student in an Organized Health Care Education/Training Program

## 2017-10-18 ENCOUNTER — Ambulatory Visit (INDEPENDENT_AMBULATORY_CARE_PROVIDER_SITE_OTHER): Payer: Medicare Other | Admitting: Student in an Organized Health Care Education/Training Program

## 2017-10-18 ENCOUNTER — Other Ambulatory Visit: Payer: Self-pay

## 2017-10-18 VITALS — BP 138/74 | HR 84 | Temp 98.4°F | Ht 62.0 in | Wt 183.4 lb

## 2017-10-18 DIAGNOSIS — I10 Essential (primary) hypertension: Secondary | ICD-10-CM | POA: Diagnosis not present

## 2017-10-18 DIAGNOSIS — Z0000 Encounter for general adult medical examination without abnormal findings: Secondary | ICD-10-CM

## 2017-10-18 DIAGNOSIS — Z794 Long term (current) use of insulin: Secondary | ICD-10-CM

## 2017-10-18 DIAGNOSIS — E118 Type 2 diabetes mellitus with unspecified complications: Secondary | ICD-10-CM | POA: Diagnosis present

## 2017-10-18 LAB — POCT GLYCOSYLATED HEMOGLOBIN (HGB A1C): HBA1C, POC (CONTROLLED DIABETIC RANGE): 9.8 % — AB (ref 0.0–7.0)

## 2017-10-18 MED ORDER — INSULIN GLARGINE 100 UNIT/ML SOLOSTAR PEN
56.0000 [IU] | PEN_INJECTOR | Freq: Every day | SUBCUTANEOUS | 99 refills | Status: DC
Start: 2017-10-18 — End: 2018-11-01

## 2017-10-18 MED ORDER — INSULIN LISPRO 100 UNIT/ML (KWIKPEN)
16.0000 [IU] | PEN_INJECTOR | Freq: Three times a day (TID) | SUBCUTANEOUS | 11 refills | Status: DC
Start: 2017-10-18 — End: 2019-01-10

## 2017-10-18 MED ORDER — EMPAGLIFLOZIN 10 MG PO TABS: 10.0000 mg | ORAL_TABLET | Freq: Every day | ORAL | 0 refills | Status: DC

## 2017-10-18 NOTE — Assessment & Plan Note (Signed)
A1c worse today compared with previous.  Patient reports that she has been eating poorly for the last 2 months.  She reports that her chronic knee pain has contributed to this lifestyle change.  -Given history of heart failure, will order Jardiance for additional therapy given data on mortality benefit patient's heart failure -Patient asks for insulins to be sent as pen rather than syringe, will order these today -encourage diet lifestyle changes -Glucose log provided - empagliflozin (JARDIANCE) 10 MG TABS tablet; Take 10 mg by mouth daily.  Dispense: 90 tablet; Refill: 0 - insulin lispro (HUMALOG) 100 UNIT/ML KiwkPen; Inject 0.16 mLs (16 Units total) into the skin 3 (three) times daily.  Dispense: 15 mL; Refill: 11 - Insulin Glargine (LANTUS SOLOSTAR) 100 UNIT/ML Solostar Pen; Inject 56 Units into the skin daily.  Dispense: 5 pen; Refill: PRN

## 2017-10-18 NOTE — Assessment & Plan Note (Signed)
Controlled today at 138/74.  Goal is 140/80. -Continue valsartan -Creatinine was recently checked in 07/2017, normal -Encourage diet and exercise

## 2017-10-18 NOTE — Progress Notes (Signed)
Subjective:    Bonnie Reed - 72 y.o. female MRN [id number]  Date of birth: 01-28-1946  HPI  Bonnie Reed is here for Diabetes follow up, HTN.   HYPERTENSION Disease Monitoring: When she checks, BP is systolic less than 009. Controlled in the office today  Chest pain, palpitations- none       Dyspnea- None Medications: Valsartan 160 mg daily Compliance- 100% reported  Lightheadedness,Syncope- none    Edema- none  DIABETES Disease Monitoring:  A1c 9.1 when previously checked 03/2017 >> 9.8 today Blood Sugar ranges - typically 160-180 each morning  Polyuria/phagia/dipsia- +polyuria       Visual problems- none, she reports recent diabetic eye exam Medications: Meds Humalog 16u TID, Lantus 56u qd, metformin 500 mg daily On ACE, ASA and Statin Compliance- 100% for insulin  Hypoglycemic symptoms- 74 on one read yesterday morning when she had not had dinner the night before, felt "Funny" light headed  Monitoring Labs and Parameters Last A1C:  Lab Results  Component Value Date   HGBA1C 9.8 (A) 10/18/2017    Last Lipid:     Component Value Date/Time   CHOL 156 04/21/2017 0930   HDL 48 04/21/2017 0930    Last Bmet  Potassium  Date Value Ref Range Status  08/14/2017 3.7 3.5 - 5.1 mmol/L Final   Sodium  Date Value Ref Range Status  08/14/2017 138 135 - 145 mmol/L Final  04/28/2017 140 134 - 144 mmol/L Final   Creatinine, Ser  Date Value Ref Range Status  08/14/2017 0.62 0.44 - 1.00 mg/dL Final      Last BPs:  BP Readings from Last 3 Encounters:  10/18/17 138/74  08/14/17 (!) 157/88  06/11/17 (!) 142/72    Health Maintenance: Dexa scan ordered today. Colonoscopy form provided. Mammogram ordered 04/21/2017.   Health Maintenance Due  Topic Date Due  . Hepatitis C Screening  09/[birth year]/1946  . FOOT EXAM  06/15/1956  . OPHTHALMOLOGY EXAM  06/15/1956  . TETANUS/TDAP  06/15/1965  . MAMMOGRAM  06/15/1996  . COLONOSCOPY  06/15/1996  . DEXA SCAN  06/16/2011      -  reports that she has never smoked. She has never used smokeless tobacco. - Review of Systems: Per HPI. - Past Medical History: Patient Active Problem List   Diagnosis Date Noted  . Healthcare maintenance 04/21/2017  . Current moderate episode of major depressive disorder without prior episode ([place]) 11/04/2016  . Breast cancer ([place]) 10/16/2016  . Diabetes ([place]) 10/16/2016  . Hypertension 10/16/2016  . CHF (congestive heart failure) ([place]) 10/16/2016   - Medications: reviewed and updated Current Outpatient Medications  Medication Sig Dispense Refill  . acetaminophen (TYLENOL) 500 MG tablet Take 1,000 mg by mouth daily as needed for mild pain.    Marland Kitchen aspirin EC 81 MG tablet Take 81 mg by mouth daily.    Marland Kitchen atorvastatin (LIPITOR) 40 MG tablet Take 1 tablet (40 mg total) by mouth daily. 90 tablet 3  . cyclobenzaprine (FLEXERIL) 10 MG tablet Take 1 tablet (10 mg total) by mouth at bedtime. 30 tablet 1  . diphenhydrAMINE (BENADRYL) 25 mg capsule Take 25 mg by mouth daily as needed for allergies.    Marland Kitchen empagliflozin (JARDIANCE) 10 MG TABS tablet Take 10 mg by mouth daily. 90 tablet 0  . EPINEPHrine 0.3 mg/0.3 mL IJ SOAJ injection Inject 0.3 mg into the muscle.    . fexofenadine (ALLEGRA) 180 MG tablet Take 180 mg by mouth.    . Insulin Glargine (  LANTUS SOLOSTAR) 100 UNIT/ML Solostar Pen Inject 56 Units into the skin daily. 5 pen PRN  . insulin lispro (HUMALOG) 100 UNIT/ML KiwkPen Inject 0.16 mLs (16 Units total) into the skin 3 (three) times daily. 15 mL 11  . metFORMIN (GLUCOPHAGE) 500 MG tablet Take 1 tablet (500 mg total) by mouth daily. 90 tablet 0  . montelukast (SINGULAIR) 10 MG tablet Take 10 mg by mouth daily.    . nabumetone (RELAFEN) 500 MG tablet Take 1 tablet (500 mg total) by mouth 2 (two) times daily as needed. 60 tablet 0  . sodium chloride (OCEAN) 0.65 % nasal spray Place 1 spray into the nose daily as needed for congestion.    . valsartan (DIOVAN) 160 MG tablet Take 1 tablet  (160 mg total) by mouth daily. 90 tablet 0   No current facility-administered medications for this visit.     Review of Systems See HPI     Objective:   Physical Exam BP 138/74   Pulse 84   Temp 98.4 F (36.9 C) (Oral)   Ht 5\' 2"  (1.575 m)   Wt 183 lb 6.4 oz (83.2 kg)   LMP 10/17/1991 (Exact Date)   SpO2 94%   BMI 33.54 kg/m  Gen: NAD, alert, cooperative with exam, well-appearing  CV: RRR, good S1/S2 Resp: CTABL, no wheezes, non-labored Abd: SNTND Skin: no rashes, normal turgor  Neuro: no gross deficits.  Psych: good insight, alert and oriented  Assessment & Plan:   Hypertension Controlled today at 138/74.  Goal is 140/80. -Continue valsartan -Creatinine was recently checked in 07/2017, normal -Encourage diet and exercise  Diabetes (HCC) A1c worse today compared with previous.  Patient reports that she has been eating poorly for the last 2 months.  She reports that her chronic knee pain has contributed to this lifestyle change.  -Given history of heart failure, will order Jardiance for additional therapy given data on mortality benefit patient's heart failure -Patient asks for insulins to be sent as pen rather than syringe, will order these today -encourage diet lifestyle changes -Glucose log provided - empagliflozin (JARDIANCE) 10 MG TABS tablet; Take 10 mg by mouth daily.  Dispense: 90 tablet; Refill: 0 - insulin lispro (HUMALOG) 100 UNIT/ML KiwkPen; Inject 0.16 mLs (16 Units total) into the skin 3 (three) times daily.  Dispense: 15 mL; Refill: 11 - Insulin Glargine (LANTUS SOLOSTAR) 100 UNIT/ML Solostar Pen; Inject 56 Units into the skin daily.  Dispense: 5 pen; Refill: PRN  Healthcare maintenance -Colonoscopy form provided today the patient can call and make an appointment -DEXA scan ordered today and information provided for patient to call make an appointment -Emigrant was previously ordered, encouraged patient to follow-up and make an appointment for this as  well.   Orders Placed This Encounter  Procedures  . HM DEXA SCAN  . POCT glycosylated hemoglobin (Hb A1C)    Meds ordered this encounter  Medications  . empagliflozin (JARDIANCE) 10 MG TABS tablet    Sig: Take 10 mg by mouth daily.    Dispense:  90 tablet    Refill:  0  . insulin lispro (HUMALOG) 100 UNIT/ML KiwkPen    Sig: Inject 0.16 mLs (16 Units total) into the skin 3 (three) times daily.    Dispense:  15 mL    Refill:  11  . Insulin Glargine (LANTUS SOLOSTAR) 100 UNIT/ML Solostar Pen    Sig: Inject 56 Units into the skin daily.    Dispense:  5 pen    Refill:  PRN    Everrett Coombe, MD,MS,  PGY3 10/18/2017 9:24 AM

## 2017-10-18 NOTE — Patient Instructions (Addendum)
It was a pleasure seeing you today in our clinic.  Here is the treatment plan we have discussed and agreed upon together:  Diabetes Your diabetes is not well controlled Your goal is to have an A1c < 7.0 Medicine Changes: Continue your insulins and metformin. We sent the quickpens to the pharmacy. START your new medication daily. Homework: Focus on diet and exercise!  Call us if: You have low blood less than 60   Come back to see [place] in: 12 weeks  Our clinic's number is [phone]. Please call with questions or concerns about what we discussed today.  Be well, Dr. Burr Medico   Sign up for My Chart to have easy access to your labs results, and communication with your primary care physician.  Fasting Blood Sugar Record  Day  Date      Blood      Time:     Sugar  Day  Date      Blood      Time:     Sugar                                                                                                                                                                                                  If a reading is high or low, then look back to what the last meal/snack was:  Write down what you last ate, how much, & what time.  Fasting Blood Sugar Record  Day  Date      Blood      Time:     Sugar  Day  Date      Blood      Time:     Sugar  If a reading is high or low, then look back to what the last meal/snack was:  Write down what you last ate, how much, & what time.  Fasting Blood Sugar Record  Day  Date      Blood      Time:     Sugar  Day  Date      Blood      Time:     Sugar                                                                                                                                                                                                                          If a reading is high or low, then look back to what the last meal/snack was:  Write down what you last ate, how much, & what time.

## 2017-10-18 NOTE — Assessment & Plan Note (Signed)
-  Colonoscopy form provided today the patient can call and make an appointment -DEXA scan ordered today and information provided for patient to call make an appointment -Emigrant was previously ordered, encouraged patient to follow-up and make an appointment for this as well.

## 2017-11-09 ENCOUNTER — Other Ambulatory Visit: Payer: Self-pay | Admitting: Student in an Organized Health Care Education/Training Program

## 2017-11-16 ENCOUNTER — Other Ambulatory Visit: Payer: Self-pay | Admitting: Student in an Organized Health Care Education/Training Program

## 2017-11-16 DIAGNOSIS — I10 Essential (primary) hypertension: Secondary | ICD-10-CM

## 2017-12-23 ENCOUNTER — Telehealth: Payer: Self-pay | Admitting: Student in an Organized Health Care Education/Training Program

## 2017-12-23 NOTE — Telephone Encounter (Signed)
Patient came to office stated she needs pcp to write a RX to [facility] for prosthesis for breast .  FAX:  [phone].  If any questions call patient at [phone].

## 2017-12-27 ENCOUNTER — Other Ambulatory Visit: Payer: Self-pay | Admitting: Student in an Organized Health Care Education/Training Program

## 2017-12-27 NOTE — Telephone Encounter (Signed)
Pt calling asking if PCP has written the Rx to [employer] for prosthesis. Please advise. Ottis Stain, CMA

## 2017-12-27 NOTE — Telephone Encounter (Signed)
I am working on this

## 2017-12-27 NOTE — Telephone Encounter (Signed)
Placed DME script to provided Fax # in our office fax box

## 2017-12-27 NOTE — Telephone Encounter (Signed)
LVM for pt to call the office, if pt calls, please give her the info below. Ottis Stain, CMA

## 2017-12-28 ENCOUNTER — Telehealth: Payer: Self-pay

## 2017-12-28 ENCOUNTER — Other Ambulatory Visit: Payer: Self-pay | Admitting: Student in an Organized Health Care Education/Training Program

## 2017-12-28 DIAGNOSIS — Z0000 Encounter for general adult medical examination without abnormal findings: Secondary | ICD-10-CM

## 2017-12-28 NOTE — Telephone Encounter (Signed)
Pt informed of below.  

## 2017-12-28 NOTE — Telephone Encounter (Signed)
Please call and let patient know she already has a mammogram ordered in the computer, she just has to call and schedule it. She also should call and schedule her own colonoscopy.   It looks like she is due for an A1c check, please see if we can get her into our office for a check-up and we can also help her obtain her age appropriate health maintenance at that visit.

## 2017-12-28 NOTE — Telephone Encounter (Signed)
Pt called nurse line stating she needs a referral put into the system for her mammogram and colonoscopy. Pt stated she would like to go to [facility] for her colonoscopy.

## 2017-12-29 NOTE — Telephone Encounter (Signed)
Informed pt of below and scheduled her an appointment for 01/20/18 for her diabetes check. Katharina Caper, Adoria Kawamoto D, [place]

## 2018-01-20 ENCOUNTER — Ambulatory Visit (INDEPENDENT_AMBULATORY_CARE_PROVIDER_SITE_OTHER): Payer: Medicare Other | Admitting: Student in an Organized Health Care Education/Training Program

## 2018-01-20 ENCOUNTER — Other Ambulatory Visit: Payer: Self-pay

## 2018-01-20 ENCOUNTER — Encounter: Payer: Self-pay | Admitting: Student in an Organized Health Care Education/Training Program

## 2018-01-20 VITALS — BP 124/60 | HR 98 | Temp 98.8°F | Ht 62.0 in | Wt 177.6 lb

## 2018-01-20 DIAGNOSIS — E1122 Type 2 diabetes mellitus with diabetic chronic kidney disease: Secondary | ICD-10-CM | POA: Diagnosis present

## 2018-01-20 DIAGNOSIS — Z23 Encounter for immunization: Secondary | ICD-10-CM

## 2018-01-20 LAB — POCT GLYCOSYLATED HEMOGLOBIN (HGB A1C): HbA1c, POC (controlled diabetic range): 8.4 % — AB (ref 0.0–7.0)

## 2018-01-20 NOTE — Progress Notes (Signed)
   CC: chronic disease follow   HPI: Bonnie Reed is a 72 y.o. female  who presents to [facility] today for follow-up of blood pressure, diabetes, hyperlipidemia.      HYPERTENSION Patient reports that she has changed her diet.  She is been exercising through walking with her 3 dogs.  She is focused on smaller portion sizes and healthier options including vegetables.  She has not had any chest pain or palpitations.  No dyspnea.  She is reporting 100% compliance with her valsartan.  No lightheadedness or syncope.  She reports that she lost 7 pounds.  No LE edema.    DIABETES Disease Monitoring: HbA1c 9.8 on previous check >>>8.4 today Polyuria/phagia/dipsia-denies       Visual problems-denies Medications: metformin 500 mg once daily, empagliflozin 10 mg daily, Lantus 56 units daily, Humalog 16 units 3 times daily. Compliance-100% reported compliance  Hypoglycemic symptoms-denies Patient ison ASA. Microalbuminuria testing deferred as patient is already on an ACE/ARB.  HYPERLIPIDEMIA Disease Monitoring: See symptoms for Hypertension Medications:Lipitor 40 mg daily Compliance-100% reported  Right upper quadrant pain-denies  Muscle aches- denies  Monitoring Labs and Parameters Last A1C:  Lab Results  Component Value Date   HGBA1C 8.4 (A) 01/20/2018    Last Lipid:     Component Value Date/Time   CHOL 156 04/21/2017 0930   HDL 48 04/21/2017 0930    Last Bmet  Potassium  Date Value Ref Range Status  08/14/2017 3.7 3.5 - 5.1 mmol/L Final   Sodium  Date Value Ref Range Status  08/14/2017 138 135 - 145 mmol/L Final  04/28/2017 140 134 - 144 mmol/L Final   Creatinine, Ser  Date Value Ref Range Status  08/14/2017 0.62 0.44 - 1.00 mg/dL Final      Last BPs:  BP Readings from Last 3 Encounters:  01/20/18 124/60  10/18/17 138/74  08/14/17 (!) 157/88   Review of Symptoms:  See HPI for ROS.   CC, SH/smoking status, and VS noted.  Objective: BP 124/60   Pulse 98    Temp 98.8 F (37.1 C) (Oral)   Ht 5\' 2"  (1.575 m)   Wt 177 lb 9.6 oz (80.6 kg)   LMP 10/17/1991 (Exact Date)   SpO2 96%   BMI 32.48 kg/m  GEN: NAD, alert, cooperative, and pleasant. RESPIRATORY: Comfortable work of breathing, speaks in full sentences, CTA bil no W/R/R CV: RRR, no m/r/g, distal extremities well perfused and warm without edema GI: Soft, nondistended SKIN: warm and dry, no rashes or lesions NEURO: II-XII grossly intact MSK: Moves 4 extremities equally PSYCH: AAOx3, appropriate affect  Assessment and plan:  1. HTN -blood pressure is well controlled today.  Suspect that any weight loss would contribute to improved control.  Continue to encourage dietary/lifestyle modifications.  Continue current regimen.  2. T2DM, insulin-dependent -A1c is improving.  Patient is happy with her progress and motivated to continue improving.  Continue current regimen.  Recheck in 3 months.  3. HLD -no red flag symptoms.  Continue current regimen.   Orders Placed This Encounter  Procedures  . Flu Vaccine QUAD 36+ mos IM  . HgB A1c    Everrett Coombe, MD,MS,  PGY3 01/20/2018 2:08 PM

## 2018-01-20 NOTE — Patient Instructions (Signed)
It was a pleasure seeing you today in our clinic.  Our clinic's number is [phone]. Please call with questions or concerns about what we discussed today.  Sign up for My Chart to have easy access to your labs results, and communication with your primary care physician.

## 2018-01-24 ENCOUNTER — Other Ambulatory Visit: Payer: Self-pay

## 2018-01-25 MED ORDER — ACCU-CHEK AVIVA PLUS W/DEVICE KIT: PACK | 0 refills | Status: DC

## 2018-01-25 MED ORDER — ACCU-CHEK SOFTCLIX LANCETS MISC: 12 refills | Status: DC

## 2018-01-25 MED ORDER — GLUCOSE BLOOD VI STRP: ORAL_STRIP | 12 refills | Status: DC

## 2018-02-01 ENCOUNTER — Ambulatory Visit (INDEPENDENT_AMBULATORY_CARE_PROVIDER_SITE_OTHER): Payer: Medicare Other | Admitting: Family Medicine

## 2018-02-01 ENCOUNTER — Other Ambulatory Visit: Payer: Self-pay

## 2018-02-01 VITALS — BP 118/60 | HR 79 | Temp 97.8°F | Wt 178.0 lb

## 2018-02-01 DIAGNOSIS — M1711 Unilateral primary osteoarthritis, right knee: Secondary | ICD-10-CM | POA: Diagnosis present

## 2018-02-01 HISTORY — DX: Unilateral primary osteoarthritis, right knee: M17.11

## 2018-02-01 MED ORDER — METHYLPREDNISOLONE ACETATE 40 MG/ML IJ SUSP
40.0000 mg | Freq: Once | INTRAMUSCULAR | Status: AC
  Administered 2018-02-01: 40 mg via INTRA_ARTICULAR

## 2018-02-01 NOTE — Progress Notes (Signed)
    Subjective:    Patient ID: Bonnie Reed, female    DOB: April 02, 1945, 72 y.o.   MRN: [id number]   CC: R knee pain  HPI: patient reports worsening R knee pain over the past several months. She last had a steroid injection was 1 year ago and she got relief for 6 months. She was told in past to consider knee replacement but is not willing to do that yet. She had arthroscopic surgery about 10 years ago for a "clean out" and has been doing ok since then. She is hopeful for good response from injection today.   Smoking status reviewed  Review of Systems   Objective:  BP 118/60   Pulse 79   Temp 97.8 F (36.6 C) (Oral)   Wt 178 lb (80.7 kg)   LMP 10/17/1991 (Exact Date)   SpO2 95%   BMI 32.56 kg/m  Vitals and nursing note reviewed  General: well nourished, in no acute distress HEENT: normocephalic, MMM Cardiac: regular rate Respiratory: no increased work of breathing Extremities: no edema or cyanosis. R knee w/o deformity or effusion compared to left. Tender to palpation along medial joint line of R knee. Normal ROM.  Neuro: alert and oriented, no focal deficits  Aspiration/Injection Procedure Note Kiyona Mcnall [id number] 11-15-1945  Procedure: Injection Indications: OA  Procedure Details Consent: Risks of procedure as well as the alternatives and risks of each were explained to the (patient/caregiver).  Consent for procedure obtained. Time Out: Verified patient identification, verified procedure, site/side was marked, verified correct patient position, special equipment/implants available, medications/allergies/relevent history reviewed, required imaging and test results available.  Performed   Local Anesthesia Used:Lidocaine 1% plain; 77mL w/ 40 mg depomedrol A sterile dressing was applied.  Patient did tolerate procedure well. Estimated blood loss: 0  Steve Rattler 02/01/2018, 11:17 AM   Assessment & Plan:    Primary osteoarthritis of right  knee  Knee injected today, patient tolerated well.  Advised patient to ice knee today. Can repeat injection in 3 months if needed, if shot does not last this long told patient she may need to consider other measures such as knee replacement. Patient verbalized understanding and agreement with plan.     Return in about 3 months (around 05/04/2018), or if symptoms worsen or fail to improve.   Lucila Maine, DO Family Medicine Resident PGY-3

## 2018-02-01 NOTE — Patient Instructions (Signed)

## 2018-02-01 NOTE — Assessment & Plan Note (Addendum)
  Knee injected today, patient tolerated well.  Advised patient to ice knee today. Can repeat injection in 3 months if needed, if shot does not last this long told patient she may need to consider other measures such as knee replacement. Patient verbalized understanding and agreement with plan.

## 2018-02-23 ENCOUNTER — Ambulatory Visit
Admission: RE | Admit: 2018-02-23 | Discharge: 2018-02-23 | Disposition: A | Discharge status: Home / Self Care | Payer: Medicare Other | Source: Ambulatory Visit | Attending: Family Medicine | Admitting: Family Medicine

## 2018-02-23 ENCOUNTER — Other Ambulatory Visit: Payer: Self-pay | Admitting: Student in an Organized Health Care Education/Training Program

## 2018-02-23 DIAGNOSIS — Z1239 Encounter for other screening for malignant neoplasm of breast: Secondary | ICD-10-CM

## 2018-02-23 DIAGNOSIS — Z794 Long term (current) use of insulin: Secondary | ICD-10-CM

## 2018-02-23 DIAGNOSIS — E118 Type 2 diabetes mellitus with unspecified complications: Secondary | ICD-10-CM

## 2018-02-23 HISTORY — DX: Personal history of irradiation: Z92.3

## 2018-02-23 HISTORY — DX: Malignant neoplasm of unspecified site of unspecified female breast: C50.919

## 2018-03-22 ENCOUNTER — Other Ambulatory Visit: Payer: Self-pay | Admitting: Student in an Organized Health Care Education/Training Program

## 2018-03-22 DIAGNOSIS — E2839 Other primary ovarian failure: Secondary | ICD-10-CM

## 2018-03-22 DIAGNOSIS — I10 Essential (primary) hypertension: Secondary | ICD-10-CM

## 2018-03-24 ENCOUNTER — Other Ambulatory Visit: Payer: Medicare Other

## 2018-03-24 ENCOUNTER — Ambulatory Visit
Admission: RE | Admit: 2018-03-24 | Discharge: 2018-03-24 | Disposition: A | Discharge status: Home / Self Care | Payer: Medicare Other | Source: Ambulatory Visit | Attending: Family Medicine | Admitting: Family Medicine

## 2018-03-24 ENCOUNTER — Encounter: Payer: Self-pay | Admitting: Student in an Organized Health Care Education/Training Program

## 2018-03-24 DIAGNOSIS — E2839 Other primary ovarian failure: Secondary | ICD-10-CM

## 2018-03-29 ENCOUNTER — Telehealth: Payer: Self-pay | Admitting: *Deleted

## 2018-03-29 DIAGNOSIS — I10 Essential (primary) hypertension: Secondary | ICD-10-CM

## 2018-03-29 MED ORDER — GLUCOSE BLOOD VI STRP: ORAL_STRIP | 12 refills | Status: DC

## 2018-03-29 MED ORDER — ONETOUCH DELICA LANCING DEV MISC: 0 refills | Status: DC

## 2018-03-29 MED ORDER — VALSARTAN 160 MG PO TABS: 160.0000 mg | ORAL_TABLET | Freq: Every day | ORAL | 0 refills | Status: DC

## 2018-03-29 MED ORDER — ONETOUCH DELICA LANCETS 33G MISC: 12 refills | Status: DC

## 2018-03-29 MED ORDER — ONETOUCH VERIO W/DEVICE KIT: PACK | 0 refills | Status: DC

## 2018-03-29 MED FILL — VALSARTAN 160 MG TABLET: 160 | 90 days supply | Qty: 90 | Fill #0

## 2018-03-29 NOTE — Telephone Encounter (Signed)
Can we just send it to a different pharmacy?

## 2018-03-29 NOTE — Telephone Encounter (Signed)
Received message from [place].  Valsartan on backorder until late Feb.  Requesting a change in medication. [place], Salome Spotted, CMA

## 2018-03-29 NOTE — Telephone Encounter (Signed)
Spoke with [facility] outpatient pharmacy. They have valsartan.  Pt is ok with sending there, I [facility].  Called to cancel valsartan @ [facility].   Patient also states that she was unable to pick up the glucometer sent in 01/2018.  I suspect that is because pt has medicare and Dr. Burr Medico is not PECOS certified (unable to verify this as I was on hold with [facility] for 15 minutes).  Will resend under Dr. Nori Riis.    Zian Mohamed, Salome Spotted, [place]

## 2018-04-08 DIAGNOSIS — L309 Dermatitis, unspecified: Secondary | ICD-10-CM | POA: Insufficient documentation

## 2018-04-08 DIAGNOSIS — M1990 Unspecified osteoarthritis, unspecified site: Secondary | ICD-10-CM | POA: Insufficient documentation

## 2018-04-08 DIAGNOSIS — E782 Mixed hyperlipidemia: Secondary | ICD-10-CM | POA: Insufficient documentation

## 2018-04-08 DIAGNOSIS — D509 Iron deficiency anemia, unspecified: Secondary | ICD-10-CM | POA: Insufficient documentation

## 2018-04-08 HISTORY — DX: Dermatitis, unspecified: L30.9

## 2018-04-08 HISTORY — DX: Mixed hyperlipidemia: E78.2

## 2018-04-08 HISTORY — DX: Iron deficiency anemia, unspecified: D50.9

## 2018-05-03 ENCOUNTER — Other Ambulatory Visit: Payer: Self-pay

## 2018-05-03 ENCOUNTER — Ambulatory Visit (INDEPENDENT_AMBULATORY_CARE_PROVIDER_SITE_OTHER): Payer: Medicare Other | Admitting: Family Medicine

## 2018-05-03 VITALS — BP 118/64 | HR 95 | Temp 98.0°F | Wt 168.0 lb

## 2018-05-03 DIAGNOSIS — S29012A Strain of muscle and tendon of back wall of thorax, initial encounter: Secondary | ICD-10-CM

## 2018-05-03 NOTE — Patient Instructions (Addendum)
It was a pleasure to see you today! Thank you for choosing [facility] for your primary care. Bonnie Reed was seen for side pain.   Our plans for today were:  I think you are right that you may have pulled a muscle. This should heal on it's own.   If things get worse, the pain gets constant, or you are otherwise concerned please call us and come back or go to the ED if it is really bad.   Please call [facility]NT to have them check on your sinuses.   Best,  Dr. Lindell Noe

## 2018-05-03 NOTE — Progress Notes (Signed)
   CC: side pain  HPI  Side pain - started 1 week ago. Under R rib cage. Thinks it amy have started by carrying her grand baby. Maybe strained a muscle. Worse when she twists or bends. Sitting still makes it better or lying down. Has tried no meds to help, no topical things on it. Not associated with eating. Never had GI issues, gallbladder is out in 1972.  No tenderness or nipple discharge of the right breast.  She does have a history of chickenpox as a child and shingles on her neck in the past, says this does not feel anything like her shingles.  She was also recently seen for a sinus infection and ear infection in [facility]. Not better from her sinuses. Feel chronically blocked. Has tried saline rinses 3-4 times per day. Saw ENT in the past to [facility]. Previously on allergy shots. Last year was her last visit to ENT.   ROS: Denies CP, SOB, abdominal pain, dysuria, changes in BMs.   CC, SH/smoking status, and VS noted  Objective: BP 118/64   Pulse 95   Temp 98 F (36.7 C) (Oral)   Wt 168 lb (76.2 kg)   LMP 10/17/1991 (Exact Date)   SpO2 94%   BMI 30.73 kg/m  Gen: NAD, alert, cooperative, and pleasant. HEENT: NCAT, EOMI, PERRL, TMs clear, oropharynx clear, no nasal discharge, no facial tenderness. CV: RRR, no murmur Resp: CTAB, no wheezes, non-labored Abd: SNTND, BS present, no guarding or organomegaly MSK: TTP over R thoracic rib area from mid back to RUQ, no overlying skin changes.  Ext: No edema, warm Neuro: Alert and oriented, Speech clear, No gross deficits  Assessment and plan:  Right side pain: Unclear etiology, patient suspects a muscle strain which is reasonable given that it is worsening with certain movements.  Less likely gallbladder pathology as she is already status post cholecystectomy.  Also considered something such as shingles, however patient has previously had this and this sensation is dissimilar to that.  No overlying rash.  No increased work of breathing or shortness  of breath to suggest pulmonary pathology.  No bruising to suggest trauma.  Patient will use symptomatic care such as Tylenol and heat, call us if no improvement.  Chronic nasal congestion: Patient previously saw ENT, no signs of acute sinusitis on my exam today.  Encouraged her to call ENT to discuss further treatment options.  Ralene Ok, MD, PGY3 05/03/2018 3:57 PM

## 2018-05-26 ENCOUNTER — Other Ambulatory Visit: Payer: Self-pay

## 2018-05-26 MED ORDER — ACCU-CHEK SOFTCLIX LANCETS MISC: 12 refills | Status: DC

## 2018-06-28 ENCOUNTER — Telehealth: Payer: Self-pay | Admitting: Family Medicine

## 2018-06-28 NOTE — Telephone Encounter (Signed)
Pt called stating having arthritic pain in her knee and would like an injection.  Has been over 6 months since her last one and she usually gets them every 3 months.  Made appt for 4/16 on 830am.

## 2018-07-07 ENCOUNTER — Ambulatory Visit: Payer: Medicare Other

## 2018-08-05 ENCOUNTER — Telehealth: Payer: Self-pay | Admitting: *Deleted

## 2018-08-05 NOTE — Telephone Encounter (Signed)
Pt states that she has a yeast infection that she gets frequently due to her jardiance.  She has a thick itchy discharge.  She has tried [employer] but it did not help.   Advised I would send message to MD but since it was so late on Friday, she may not get an answer before Monday.  Pt understands and would like me to send to MD anyways.   Of note, pt states her temp is 99.7 and is coughing.  She denies SOB and states that the cough is normal for her. Christen Bame, CMA

## 2018-08-08 ENCOUNTER — Other Ambulatory Visit: Payer: Self-pay | Admitting: Student in an Organized Health Care Education/Training Program

## 2018-08-08 MED ORDER — FLUCONAZOLE 150 MG PO TABS: 150.0000 mg | ORAL_TABLET | Freq: Once | ORAL | 0 refills | Status: AC

## 2018-08-08 NOTE — Telephone Encounter (Signed)
I sent a prescription for diflucan to her pharmacy at [facility]. She should take the first pill and if symptoms do not significantly improve or resolve she may take the second pill in 1-2 days. If symptoms persist beyond this medication she will need an office visit.

## 2018-08-10 NOTE — Telephone Encounter (Signed)
Pt informed of below.Bonnie Reed, CMA ? ?

## 2018-10-05 ENCOUNTER — Other Ambulatory Visit: Payer: Self-pay | Admitting: Student in an Organized Health Care Education/Training Program

## 2018-10-05 DIAGNOSIS — I10 Essential (primary) hypertension: Secondary | ICD-10-CM

## 2018-10-31 ENCOUNTER — Other Ambulatory Visit: Payer: Self-pay | Admitting: Student in an Organized Health Care Education/Training Program

## 2018-10-31 DIAGNOSIS — E118 Type 2 diabetes mellitus with unspecified complications: Secondary | ICD-10-CM

## 2018-10-31 DIAGNOSIS — Z794 Long term (current) use of insulin: Secondary | ICD-10-CM

## 2018-11-01 NOTE — Telephone Encounter (Signed)
Please have patient make an appointment with me for a diabetes check. The last A1c was 6 months ago. Thank you, have a wonderful day!

## 2018-11-10 ENCOUNTER — Telehealth: Payer: Self-pay

## 2018-11-10 NOTE — Telephone Encounter (Signed)
Faxed signed order back to [facility], sent to HIM for scanning to chart.

## 2018-12-08 ENCOUNTER — Other Ambulatory Visit: Payer: Self-pay | Admitting: Family Medicine

## 2018-12-08 DIAGNOSIS — E118 Type 2 diabetes mellitus with unspecified complications: Secondary | ICD-10-CM

## 2018-12-08 DIAGNOSIS — Z794 Long term (current) use of insulin: Secondary | ICD-10-CM

## 2019-01-10 ENCOUNTER — Other Ambulatory Visit: Payer: Self-pay | Admitting: Family Medicine

## 2019-01-10 ENCOUNTER — Other Ambulatory Visit: Payer: Self-pay | Admitting: Student in an Organized Health Care Education/Training Program

## 2019-01-10 DIAGNOSIS — E118 Type 2 diabetes mellitus with unspecified complications: Secondary | ICD-10-CM

## 2019-01-10 DIAGNOSIS — Z794 Long term (current) use of insulin: Secondary | ICD-10-CM

## 2019-01-18 ENCOUNTER — Other Ambulatory Visit: Payer: Self-pay | Admitting: Family Medicine

## 2019-01-18 ENCOUNTER — Other Ambulatory Visit: Payer: Self-pay | Admitting: Student in an Organized Health Care Education/Training Program

## 2019-01-18 DIAGNOSIS — I10 Essential (primary) hypertension: Secondary | ICD-10-CM

## 2019-01-18 DIAGNOSIS — E118 Type 2 diabetes mellitus with unspecified complications: Secondary | ICD-10-CM

## 2019-01-18 DIAGNOSIS — Z794 Long term (current) use of insulin: Secondary | ICD-10-CM

## 2019-01-23 ENCOUNTER — Encounter: Payer: Self-pay | Admitting: Family Medicine

## 2019-01-23 ENCOUNTER — Telehealth (INDEPENDENT_AMBULATORY_CARE_PROVIDER_SITE_OTHER): Payer: Medicare Other | Admitting: Family Medicine

## 2019-01-23 DIAGNOSIS — J019 Acute sinusitis, unspecified: Secondary | ICD-10-CM | POA: Diagnosis not present

## 2019-01-23 DIAGNOSIS — B9689 Other specified bacterial agents as the cause of diseases classified elsewhere: Secondary | ICD-10-CM

## 2019-01-23 MED ORDER — AMOXICILLIN-POT CLAVULANATE 875-125 MG PO TABS: 1.0000 | ORAL_TABLET | Freq: Two times a day (BID) | ORAL | 0 refills | Status: DC

## 2019-01-23 NOTE — Progress Notes (Signed)
[facility] Telemedicine Visit  Patient consented to have virtual visit. Method of visit: Telephone  Encounter participants: Patient: Bonnie Reed - located at home Provider: Martyn Malay - located at family medicine center Others (if applicable): None  Chief Complaint: Sinus congestion  HPI:  Bonnie Reed is a pleasant 73 year old woman with history of type 2 diabetes which is not well controlled, breast cancer, depression and heart failure sent in via virtual visit for sinus pain and pressure.  She reports a 1 month history of worsening symptoms.  She describes this started as frontal sinus and maxillary sinus pain.  She now has teeth pain on both sides, bilateral ear fullness and congestion.  She is eating and drinking well.  She reports she has tried everything over-the-counter.  This is included Aleve, Allegra, Zyrtec, Tylenol.  She has tried no intranasal steroid or intranasal saline.  She denies dyspnea or cough.  She denies fevers.  She reports her mucus is greenish in color.    She reports she is largely been staying at home.  Her symptoms have been ongoing for a month.  She has no close contacts with anyone with Covid.  She lives alone.  The patient reports her blood sugars have been elevated the past several weeks.  Her morning sugar is between 180 and 190.  She is on metformin, Jardiance and insulin.  She has not followed up with her PCP this year.  She is amenable to doing so later this month.  She denies polyuria or polydipsia.  She denies hypoglycemia.   ROS: per HPI  Pertinent PMHx:  Type 2 diabetes on insulin Breast cancer, she follows with [facility] History of sinusitis last in January 2020   Exam:  Respiratory: Speaking in full sentences.  She does have some change in her voice notable to be nasal in quality.  Assessment/Plan:  Diagnoses and all orders for this visit:  Acute bacterial sinusitis, no dental etiology  identified, less likely otitis media given age and symptoms.  She is speaking in full sentences and doing well, no concerns for pneumonia at this time.  Recommended intranasal saline and symptomatic management with Tylenol and antihistamines.  Given duration tenderness and green sputum quality will prescribe in a course of antibiotic therapy -     amoxicillin-clavulanate (AUGMENTIN) 875-125 MG tablet; Take 1 tablet by mouth 2 (two) times daily.  Type 2 diabetes schedule follow-up with primary care physician for November.  Time spent during visit with patient: 9 minutes  Dorris Singh, MD  [facility] Medicine Teaching Service

## 2019-01-31 ENCOUNTER — Other Ambulatory Visit: Payer: Self-pay

## 2019-01-31 NOTE — Patient Outreach (Signed)
[facility] Evaluation Interviewer attempted to call patient on today regarding Aging Gracefully referral.   Patient was available, but wanted to schedule survey completion at a later date. Appointment scheduled for February 06, 2019 at 10:00 a.m.  [facility] Management Assistant

## 2019-02-06 ENCOUNTER — Other Ambulatory Visit: Payer: Self-pay

## 2019-02-06 ENCOUNTER — Telehealth: Payer: Self-pay

## 2019-02-06 NOTE — Telephone Encounter (Signed)
Faxed signed orders back to [facility], sent to HIM for scan.

## 2019-02-06 NOTE — Patient Outreach (Signed)
[facility] Evaluation Interviewer made contact with patient. Aging Gracefully survey completed.   Interviewer will send referral to OT for follow up. CMA will also referral patient to Reginia Naas, RN for follow up.   [facility] Management Assistant

## 2019-02-08 ENCOUNTER — Telehealth: Payer: Self-pay

## 2019-02-08 ENCOUNTER — Ambulatory Visit (INDEPENDENT_AMBULATORY_CARE_PROVIDER_SITE_OTHER): Payer: Medicare Other | Admitting: Family Medicine

## 2019-02-08 ENCOUNTER — Encounter: Payer: Self-pay | Admitting: Family Medicine

## 2019-02-08 ENCOUNTER — Other Ambulatory Visit: Payer: Self-pay

## 2019-02-08 VITALS — BP 144/70 | HR 83 | Wt 175.0 lb

## 2019-02-08 DIAGNOSIS — E1122 Type 2 diabetes mellitus with diabetic chronic kidney disease: Secondary | ICD-10-CM | POA: Diagnosis present

## 2019-02-08 DIAGNOSIS — Z23 Encounter for immunization: Secondary | ICD-10-CM

## 2019-02-08 DIAGNOSIS — I10 Essential (primary) hypertension: Secondary | ICD-10-CM | POA: Diagnosis not present

## 2019-02-08 DIAGNOSIS — E118 Type 2 diabetes mellitus with unspecified complications: Secondary | ICD-10-CM

## 2019-02-08 DIAGNOSIS — M79604 Pain in right leg: Secondary | ICD-10-CM

## 2019-02-08 DIAGNOSIS — Z794 Long term (current) use of insulin: Secondary | ICD-10-CM

## 2019-02-08 DIAGNOSIS — Z1159 Encounter for screening for other viral diseases: Secondary | ICD-10-CM

## 2019-02-08 DIAGNOSIS — J302 Other seasonal allergic rhinitis: Secondary | ICD-10-CM | POA: Diagnosis not present

## 2019-02-08 DIAGNOSIS — Z0000 Encounter for general adult medical examination without abnormal findings: Secondary | ICD-10-CM

## 2019-02-08 HISTORY — DX: Pain in right leg: M79.604

## 2019-02-08 LAB — POCT GLYCOSYLATED HEMOGLOBIN (HGB A1C): HbA1c, POC (controlled diabetic range): 9.5 % — AB (ref 0.0–7.0)

## 2019-02-08 MED ORDER — NAPROXEN 500 MG PO TABS: 500.0000 mg | ORAL_TABLET | Freq: Two times a day (BID) | ORAL | 0 refills | Status: DC

## 2019-02-08 MED ORDER — ONETOUCH VERIO VI STRP: ORAL_STRIP | 12 refills | Status: DC

## 2019-02-08 MED ORDER — LANTUS SOLOSTAR 100 UNIT/ML ~~LOC~~ SOPN: 56.0000 [IU] | PEN_INJECTOR | Freq: Every day | SUBCUTANEOUS | 3 refills | Status: DC

## 2019-02-08 MED ORDER — ONETOUCH VERIO W/DEVICE KIT: PACK | 0 refills | Status: DC

## 2019-02-08 MED ORDER — METFORMIN HCL 500 MG PO TABS: 1000.0000 mg | ORAL_TABLET | Freq: Every day | ORAL | 0 refills | Status: DC

## 2019-02-08 MED ORDER — MONTELUKAST SODIUM 10 MG PO TABS: 10.0000 mg | ORAL_TABLET | Freq: Every day | ORAL | 0 refills | Status: DC

## 2019-02-08 MED ORDER — TETANUS-DIPHTH-ACELL PERTUSSIS 5-2.5-18.5 LF-MCG/0.5 IM SUSP: 0.5000 mL | Freq: Once | INTRAMUSCULAR | 0 refills | Status: AC

## 2019-02-08 NOTE — Assessment & Plan Note (Addendum)
Acute.  History is osteoarthritis of the right hip.  History of joint replacement.  Patient has 5 out of 5 strength bilaterally lower extremity.  Gait is normal. Pain reproducible in the PSIS joint with resistance. Minimal relief with counterstrain; pain decreased down to 8 from 10. Could be a mild injury due to impact from pet is more than likely self-limiting continue to improve over time.  Pain duration for 1 week and patient has only taken Tylenol twice daily.  Alternate Tylenol and naproxen for relief.  Apply heat to hip joint before daily activities and apply ice to hip joint in the evenings after daily activities.  Handout on strengthening hip joint muscles given.  Will revisit and reassess with 1 month virtual visit follow-up.  -Naproxen 500 mg twice daily as needed -Hip strengthening exercises

## 2019-02-08 NOTE — Progress Notes (Addendum)
Patient Name: Bonnie Reed Date of Birth: 1945-03-24 Date of Visit: 02/08/19 PCP: Gerlene Fee, DO  Chief Complaint: A1c, R leg pain  Subjective: Bonnie Reed is a pleasant 73 y.o. with medical history significant for uncontrolled diabetes, osteoarthritis, hypertension presenting today for an A1c and right leg pain.   Uncontrolled diabetes This point can was seen and had an A1c of 8.4.  Today her A1c is 9.5.  She does not check her blood sugars at home regularly but when she does they average 175-200s. 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  She has been hesitant to come in and have an office visit due to Covid. She states that she does her insulin regularly.  She takes 16 units of Humalog 3 times a day and 56 units of Lantus at night as well as 500 mg of Metformin daily.  She does not experience any lightheaded dizziness or sweating reports no hypoglycemic episodes.   Right Leg Pain 1 week ago she was playing with her pit bull and he ran into her right leg.  He has had that particular hip replaced 3 times.  She says she is experiencing pain up and down the side of her leg with some occasional numbness and tingling.  She has tried Tylenol times daily with mild relief.  In office her pain is 10 out of 10.  Hypertension Blood pressure today is 144/70.  She does check her blood pressure at home occasionally.  Today she is not experiencing any headache blurry vision shortness of breath or chest pain.  She continues to take her medication as prescribed.  ROS: Per HPI.   I have reviewed the patient's medical, surgical, family, and social history as appropriate.  Vitals:   02/08/19 0842  BP: (!) 144/70  Pulse: 83  SpO2: 98%   General: Appears well, no acute distress. Age appropriate. Cardiac: RRR, normal heart sounds, no murmurs Respiratory: CTAB, normal effort MSK: RLE 5/5 strength  bilaterally. Full ROM. Normal gait. Pain in Rt. PSIS reproduced with manipulation relieved to 8/10 pain with counterstrain. Extremities: No edema or cyanosis. Skin: Warm and dry, no rashes noted Neuro: alert and oriented, no focal deficits Psych: normal affect  Type 2 diabetes mellitus with complication, with long-term current use of insulin (HCC) Uncontrolled.  A1c of 9.5.  Continue current insulin regimen for now, but consider adjusting with 1 month virtual follow-up visit with regular CBG recordings.  She is not currently endorsing any symptomatology of hypoglycemic episodes.  Today we will increase Metformin from 500 mg daily to 500 mg twice daily.  Requested patient attempt to take blood sugar twice daily in the morning before breakfast and in the evening before dinner. -BMP -Follow-up in 1 month with virtual visit  Right leg pain Acute.  History is osteoarthritis of the right hip.  History of joint replacement.  Patient has 5 out of 5 strength bilaterally lower extremity.  Gait is normal. Pain reproducible in the PSIS joint with resistance. Minimal relief with counterstrain; pain decreased down to 8 from 10. Could be a mild injury due to impact from pet is more than likely self-limiting continue to improve over time.  Pain duration for 1 week and patient has only taken Tylenol twice daily.  Alternate Tylenol and naproxen for relief.  Apply heat to hip joint before daily activities and apply ice to hip joint in the evenings after  daily activities.  Handout on strengthening hip joint muscles given.  Will revisit and reassess with 1 month virtual visit follow-up.  -Naproxen 500 mg twice daily as needed -Hip strengthening exercises   Hypertension Elevated today in the office.  Patient denies headache, blurry vision, chest pain, chest tightness.  Continue valsartan, Lipitor, aspirin. -Obtain lipid panel today  Healthcare maintenance -Tdap (printed script for pharmacy to administer) -Flu  vaccine -Hep C screening -PPV 23    Return to care in 1 month for a virtual visit to check up on your blood sugar.   Gerlene Fee, [place] [facility] PGY-1

## 2019-02-08 NOTE — Assessment & Plan Note (Signed)
Elevated today in the office.  Patient denies headache, blurry vision, chest pain, chest tightness.  Continue valsartan, Lipitor, aspirin. -Obtain lipid panel today

## 2019-02-08 NOTE — Patient Instructions (Addendum)
It was very nice to meet you today. Please enjoy the rest of your week. Today you were seen for diabetes check and right leg pain. We changed Metformin 500 mg to twice daily. I have prescribed naproxen for leg pain. Follow up in 1 month with a virtual visit for diabetes checkup or sooner if needed.   Please call the clinic at [phone] if your symptoms worsen or you have any concerns. It was our pleasure to serve you.   Hip Exercises Ask your health care provider which exercises are safe for you. Do exercises exactly as told by your health care provider and adjust them as directed. It is normal to feel mild stretching, pulling, tightness, or discomfort as you do these exercises. Stop right away if you feel sudden pain or your pain gets worse. Do not begin these exercises until told by your health care provider. Stretching and range-of-motion exercises These exercises warm up your muscles and joints and improve the movement and flexibility of your hip. These exercises also help to relieve pain, numbness, and tingling. You may be asked to limit your range of motion if you had a hip replacement. Talk to your health care provider about these restrictions. Hamstrings, supine  1. Lie on your back (supine position). 2. Loop a belt or towel over the ball of your left / right foot. The ball of your foot is on the walking surface, right under your toes. 3. Straighten your left / right knee and slowly pull on the belt or towel to raise your leg until you feel a gentle stretch behind your knee (hamstring). ? Do not let your knee bend while you do this. ? Keep your other leg flat on the floor. 4. Hold this position for ____10 ______ seconds. 5. Slowly return your leg to the starting position. Repeat __3-5 ________ times. Complete this exercise ____1 ______ times a day. Hip rotation  1. Lie on your back on a firm surface. 2. With your left / right hand, gently pull your left / right knee toward the  shoulder that is on the same side of the body. Stop when your knee is pointing toward the ceiling. 3. Hold your left / right ankle with your other hand. 4. Keeping your knee steady, gently pull your left / right ankle toward your other shoulder until you feel a stretch in your buttocks. ? Keep your hips and shoulders firmly planted while you do this stretch. 5. Hold this position for ____10 ______ seconds. Repeat ____1 ______ times. Complete this exercise __3-5 ________ times a day. Seated stretch This exercise is sometimes called hamstrings and adductors stretch. 1. Sit on the floor with your legs stretched wide. Keep your knees straight during this exercise. 2. Keeping your head and back in a straight line, bend at your waist to reach for your left foot (position A). You should feel a stretch in your right inner thigh (adductors). 3. Hold this position for _____10_____ seconds. Then slowly return to the upright position. 4. Keeping your head and back in a straight line, bend at your waist to reach forward (position B). You should feel a stretch behind both of your thighs and knees (hamstrings). 5. Hold this position for ____10 ______ seconds. Then slowly return to the upright position. 6. Keeping your head and back in a straight line, bend at your waist to reach for your right foot (position C). You should feel a stretch in your left inner thigh (adductors). 7. Hold this position  for ___10 _______ seconds. Then slowly return to the upright position. Repeat ______ in____ times. Complete this exercise __1 ________ times a day. Lunge This exercise stretches the muscles of the hip (hip flexors). 1. Place your left / right knee on the floor and bend your other knee so that is directly over your ankle. You should be half-kneeling. 2. Keep good posture with your head over your shoulders. 3. Tighten your buttocks to point your tailbone downward. This will prevent your back from arching too  much. 4. You should feel a gentle stretch in the front of your left / right thigh and hip. If you do not feel a stretch, slide your other foot forward slightly and then slowly lunge forward with your chest up until your knee once again lines up over your ankle. ? Make sure your tailbone continues to point downward. 5. Hold this position for _____10_____ seconds. 6. Slowly return to the starting position. Repeat _____3-5_____ times. Complete this exercise __1 ________ times a day. Strengthening exercises These exercises build strength and endurance in your hip. Endurance is the ability to use your muscles for a long time, even after they get tired. Bridge This exercise strengthens the muscles of your hip (hip extensors). 1. Lie on your back on a firm surface with your knees bent and your feet flat on the floor. 2. Tighten your buttocks muscles and lift your bottom off the floor until the trunk of your body and your hips are level with your thighs. ? Do not arch your back. ? You should feel the muscles working in your buttocks and the back of your thighs. If you do not feel these muscles, slide your feet 1-2 inches (2.5-5 cm) farther away from your buttocks. 3. Hold this position for ___10 _______ seconds. 4. Slowly lower your hips to the starting position. 5. Let your muscles relax completely between repetitions. Repeat _____3-5_____ times. Complete this exercise __1 ________ times a day. Straight leg raises, side-lying This exercise strengthens the muscles that move the hip joint away from the center of the body (hip abductors). 1. Lie on your side with your left / right leg in the top position. Lie so your head, shoulder, hip, and knee line up. You may bend your bottom knee slightly to help you balance. 2. Roll your hips slightly forward, so your hips are stacked directly over each other and your left / right knee is facing forward. 3. Leading with your heel, lift your top leg 4-6 inches  (10-15 cm). You should feel the muscles in your top hip lifting. ? Do not let your foot drift forward. ? Do not let your knee roll toward the ceiling. 4. Hold this position for ____10 ______ seconds. 5. Slowly return to the starting position. 6. Let your muscles relax completely between repetitions. Repeat _____3-5_____ times. Complete this exercise ____1 ______ times a day. Straight leg raises, side-lying This exercise strengthen the muscles that move the hip joint toward the center of the body (hip adductors). 1. Lie on your side with your left / right leg in the bottom position. Lie so your head, shoulder, hip, and knee line up. You may place your upper foot in front to help you balance. 2. Roll your hips slightly forward, so your hips are stacked directly over each other and your left / right knee is facing forward. 3. Tense the muscles in your inner thigh and lift your bottom leg 4-6 inches (10-15 cm). 4. Hold this position for _____10_____  seconds. 5. Slowly return to the starting position. 6. Let your muscles relax completely between repetitions. Repeat _____3-5_____ times. Complete this exercise __1 ________ times a day. Straight leg raises, supine This exercise strengthens the muscles in the front of your thigh (quadriceps). 1. Lie on your back (supine position) with your left / right leg extended and your other knee bent. 2. Tense the muscles in the front of your left / right thigh. You should see your kneecap slide up or see increased dimpling just above your knee. 3. Keep these muscles tight as you raise your leg 4-6 inches (10-15 cm) off the floor. Do not let your knee bend. 4. Hold this position for _____10_____ seconds. 5. Keep these muscles tense as you lower your leg. 6. Relax the muscles slowly and completely between repetitions. Repeat _____3- 5_____ times. Complete this exercise ____1 ______ times a day. Hip abductors, standing This exercise strengthens the muscles that  move the leg and hip joint away from the center of the body (hip abductors). 1. Tie one end of a rubber exercise band or tubing to a secure surface, such as a chair, table, or pole. 2. Loop the other end of the band or tubing around your left / right ankle. 3. Keeping your ankle with the band or tubing directly opposite the secured end, step away until there is tension in the tubing or band. Hold on to a chair, table, or pole as needed for balance. 4. Lift your left / right leg out to your side. While you do this: ? Keep your back upright. ? Keep your shoulders over your hips. ? Keep your toes pointing forward. ? Make sure to use your hip muscles to slowly lift your leg. Do not tip your body or forcefully lift your leg. 5. Hold this position for ____10 ______ seconds. 6. Slowly return to the starting position. Repeat ___3-5 _______ times. Complete this exercise _1 _________ times a day. Squats This exercise strengthens the muscles in the front of your thigh (quadriceps). 1. Stand in a door frame so your feet and knees are in line with the frame. You may place your hands on the frame for balance. 2. Slowly bend your knees and lower your hips like you are going to sit in a chair. ? Keep your lower legs in a straight-up-and-down position. ? Do not let your hips go lower than your knees. ? Do not bend your knees lower than told by your health care provider. ? If your hip pain increases, do not bend as low. 3. Hold this position for ____10 _______ seconds. 4. Slowly push with your legs to return to standing. Do not use your hands to pull yourself to standing. Repeat ____3-5 ______ times. Complete this exercise __and ________ times a day. This information is not intended to replace advice given to you by your health care provider. Make sure you discuss any questions you have with your health care provider. Document Released: 03/27/2005 Document Revised: 01/18/2018 Document Reviewed:  01/18/2018 Elsevier Patient Education  2020 [employer].

## 2019-02-08 NOTE — Telephone Encounter (Signed)
[place] calls nurse line stating the test strips needs to be sent back through using an attending, so medicare will cover.  Will send exact prescription prescribed today with [facility] info.

## 2019-02-08 NOTE — Assessment & Plan Note (Addendum)
Uncontrolled.  A1c of 9.5.  Continue current insulin regimen for now, but consider adjusting with 1 month virtual follow-up visit with regular CBG recordings.  She is not currently endorsing any symptomatology of hypoglycemic episodes.  Today we will increase Metformin from 500 mg daily to 500 mg twice daily.  Requested patient attempt to take blood sugar twice daily in the morning before breakfast and in the evening before dinner. -BMP -Follow-up in 1 month with virtual visit

## 2019-02-08 NOTE — Assessment & Plan Note (Addendum)
-  Tdap (printed script for pharmacy to administer) -Flu vaccine -Hep C screening -PPV 23

## 2019-02-09 LAB — LIPID PANEL
Chol/HDL Ratio: 4 ratio (ref 0.0–4.4)
Cholesterol, Total: 162 mg/dL (ref 100–199)
HDL: 41 mg/dL (ref >39)
LDL Chol Calc (NIH): 103 mg/dL — ABNORMAL HIGH (ref 0–99)
Triglycerides: 100 mg/dL (ref 0–149)
VLDL Cholesterol Cal: 18 mg/dL (ref 5–40)

## 2019-02-09 LAB — BASIC METABOLIC PANEL
BUN/Creatinine Ratio: 19 (ref 12–28)
BUN: 15 mg/dL (ref 8–27)
CO2: 20 mmol/L (ref 20–29)
Calcium: 9.9 mg/dL (ref 8.7–10.3)
Chloride: 102 mmol/L (ref 96–106)
Creatinine, Ser: 0.79 mg/dL (ref 0.57–1.00)
GFR calc Af Amer: 86 mL/min/{1.73_m2} (ref >59)
GFR calc non Af Amer: 75 mL/min/{1.73_m2} (ref >59)
Glucose: 73 mg/dL (ref 65–99)
Potassium: 4 mmol/L (ref 3.5–5.2)
Sodium: 141 mmol/L (ref 134–144)

## 2019-02-09 LAB — HEPATITIS C ANTIBODY (REFLEX): HCV Ab: <0.1 s/co ratio (ref 0.0–0.9)

## 2019-02-09 LAB — HCV COMMENT:

## 2019-02-20 ENCOUNTER — Other Ambulatory Visit: Payer: Self-pay

## 2019-02-20 ENCOUNTER — Other Ambulatory Visit: Payer: Self-pay | Admitting: Occupational Therapy

## 2019-02-20 NOTE — Patient Outreach (Addendum)
Aging Gracefully Program  OT Initial Visit  02/20/2019  Bonnie Reed [facility] [birth year] [id number]  Visit:  1- Initial Visit  Start Time:  [id number] End Time:  1800 Total Minutes:  88  CCAP: Typical Daily Routine: Typical Daily Routine:: Gets up, washes up, makes and eats breakfast, cleans up around the house, makes lunch, does some more cleaning, makes supper, cleans up, goes to bed.  Interacts with sister and brother-in-law throughout the day (they live with her) What Types Of Care Problems Are You Having Throughout The Day?: Struggles some with getting into/out of shower, standing in shower, does not wear socks becasue cannot get them on, up and down steps to laundry, getting good restful sleep, getting out of the car--all because of a painful right hip What Kind Of Help Do You Receive?: Son brings laundry up steps, sister picks up things off of the floor Do You Think You Need Other Types Of Help?: yes What Do You Think Would Make Everyday Life Easier For You?: Seat for shower, grab bars in shower, grab bar at toilet, something to help me get my right leg out of the car easier, something to help me get things off of the floor What Is A Good Day Like?: When I am in less pain What Is A Bad Day Like?: When I am in more pain Patient Reported Equipment: Patient Reported Equipment Currently Used: Single Chief [employer] Officer Indoors/Getting Around the [employer]:   Functional Mobility-Walk A Block: Walk A Block: Moderate Difficulty Do You:: Use A Device Importance Of Learning New Strategies:: Not At All Observation: Walk A Block: N/O Functional Mobility-Maintain Balance While Showering: Maintaining Balance While Showering: Moderate Difficulty Do You:: No Device/No Assistance Importance Of Learning New Strategies:: Very Much Other Comments:: walk in shower Observation: Maintain Balance While Showering: N/O Safety: Moderate/Extreme Risk Intervention: Yes Other Comments:: Have  recommended a shower seat and grab bars Functional Mobility-Stooping, Crouching, Kneeling To Retreive Item: Stooping, Crouching, or Kneeling To Retrieve Item: Unable To Do Importance Of Learning New Strategies:: Not At All Observation: Stoop, Crouch, or Kneel: N/O Intervention: No Functional Mobility-Bending From Standing Position To Pick Up Clothing Off The Floor: Bending Over From Standing Position To Pick Up Clothing Off The Floor: A Lot Of Difficulty Do You:: No Device/No Assistance Importance Of Learning New Strategies:: A Little Observation: Bending Over From Standing Postion To Pick Up Clothing Off Of Floor: (cannot do this) Safety: Extreme Risk Efficiency: Not At All Intervention: Yes Other Comments:: have recommended a reacher Functional Mobility-Reaching For Items Above Shoulder Level: Reaching For Items Above Shoulder Level: A Little Difficulty Do You:: No Device/No Assistance Other Comments:: Has a history of left broken shoudler and can only really raise right upper extremity to about 90 degrees on her own Observation: Reaching For Items Above Shoulder Level: Moderate Assistance Safety: A Little Risk Efficiency: Not At All Intervention: Yes Other Comments:: recommeded a reacher Functional Mobility-Climb 1 Flight Of Stairs: Climb 1 Flight Of Stairs: Moderate Difficulty Do You:: Use A Device Importance Of Learning New Strategies:: Moderate Other Comments:: Has one rail on right as she holds onto as she goes up/down steps. Uses step to technique Observation: Climb One Flight Of Stairs: Supervision Safety: Moderate/Extreme Risk Efficiency: Somewhat Intervention: Yes Other Comments:: recommended rail on left side (as you go down) so she will have a rail on both sides Functional Mobility-Move In And Out Of Chair: Move In and Out Of A Chair: A Little Difficulty Importance Of Learning New Strategies::  Not At All Observation: Move In And Out Of Chair: Independent With Pain,  Difficulty, Or Use Of Device Safety: No Risk Efficiency: Somewhat Intervention: No Functional Mobility-Move In And Out Of Bed: Walking Indoors/Getting Around The House: A Little Difficulty Do You:: Use A Device Importance Of Learning New Strategies:: Not At All Observation: Move In and Out Of Bed: Independent With Pain, Difficulty, Or Use Of Device(has a one step and 2 step step stool for each side of bed) Safety: A Little Risk Efficiency: Very Intervention: No Functional Mobility-Move In And Out Of Bath/Shower: Move In And Out Of A Bath/Shower: A Little Difficulty Do You:: (holds onto wall) Importance Of Learning New Strategies:: Moderate Observation: Move In And Out Of Bath/Shower: Independent With Pain, Difficulty, Or Use Of Device Safety: Moderate/Extreme Risk Efficiency: Somewhat Intervention: Yes Other Comments:: Recommended grab bars and shower seat Functional Mobility-Get On And Off Toilet: Getting Up From The Floor: Unable To Do Importance Of Learning New Strategies:: N/A Observation: Getting Up From The Floor: N/O Other Comments:: (How to get up from a fall handout was provided) Functional Mobility-Into And Out Of Car, Not Including Driving: Into  And Out Of Car, Not Including Driving: A Little Difficulty Do You:: (Uses her hands to help her her right leg out of car) Importance Of Learning New Strategies:: A Little Observation: Into And Out Of Car, Not Including Driving: N/O Safety: N/O Efficiency: N/O Intervention: Yes Other Comments:: Recommeded trying a leg lifter Functional Mobility-Other Mobility Difficulty:      Activities of Daily Living-Bathing/Showering: ADL-Bathing/Showering: A Little Difficulty Do You:: No Device/No Assistance Importance Of Learning New Strategies: A Little Other Comments:: (Has trouble washing back, lower legs and feet) ADL Observation: Bathing/Showering: N/O Safety: Moderate/Extreme Risk Efficiency: N/O Intervention: Yes Other  Comments:: Recommended shower seat, grab bars, and AE Activities of Daily Living-Personal Hygiene and Grooming: Personal Hygiene and Grooming: No Difficulty Activities of Daily Living-Toilet Hygiene:   Activities of Daily Living-Put On And Take Off Undergarments (Incl. Fasteners): Put On And Take Off Undergarments (Incl. Fasteners): No Difficulty Activities of Daily Living-Put On And Take Off Shirt/Dress/Coat (Incl. Fasteners): Put On And Take Off Shirt/Dress/Coat (Incl. Fasteners): A Little Difficulty Do You:: No Device/No Assistance Importance Of Learning New Strategies: A Little Other Comments:: Patient already donns/doffs a pullover shirt as we would teach a patient if they had had shoulder surgery. The only other recommendation made was to get a size larger for more ease ADL Observation: Put On and Take Off Shirt/Dress/Coat (Incl. Fasteners): N/O Safety: N/O Intervention: No Activities of Daily Living-Put On And Take Off Socks And Shoes: Put On And Take Off Socks And  Shoes: Moderate Difficulty(Pt does not wear socks due to too hard to get off and on;) Do You:: No Device/No Assistance Importance Of Learning New Strategies: Moderate Other Comments:: only wears slip on shoes ADL Observation: Put On And Take Off Socks And Shoes: N/O Safety: N/O Efficiency: N/O Intervention: Yes Other Comments:: recommeded a sock aid and long handled shoe horn Activities of Daily Living-Feed Self: Feed Self: No Difficulty Activities of Daily Living-Rest And Sleep: Rest and Sleep: A Lot of Difficulty Do You:: No Device/No Assistance Importance Of Learning New Strategies: Moderate ADL Observation: Rest And Sleep: N/O Safety: N/O Efficiency: N/O Intervention: (maybe) Activities of Daily Living-Sexual Activity: Sexual  Activity: N/A Activities of Daily Living-Other Activity Identified:    Instrumental Activities of Daily Living-Light Homemaking (Laundry, Straightening Up, Vacuuming):  Do Light  Homemaking (Laundry, Straightening Up, Vacuuming): Moderate  Difficulty(with laundry due to downstairs) Importance Of Learning New Strategies: A Little Other Comments:: She throws her laundry down the steps, then walks down the steps, uses her cane to put them in the clothes bin, then drags clothes bin to washer and dryer area. Son (who doesn't live there) brings clothes up the steps for her IADL Observation: Do Light Homemaking (Laundry, Straightening Up, Vacuuming): Minimal Assistance Safety: Moderate/Extreme Risk Efficiency: N/O Intervention: Yes Other Comments:: recommeded 2nd rail at steps and a reacher Instrumental Activities of Daily Living-Making A Bed: Making a Bed: A Lot of Difficulty Do You:: No Device/No Assistance Importance Of Learning New Strategies: Moderate Other Comments:: Patient has a very nice bed. It is plush and high and king size so not alot of room to move around it to get it made. She and her sister do it together to make it as easy as possible IADL Observation: Making A Bed: N/O Safety: N/O Efficiency: N/O Intervention: No Instrumental Activities of Daily Living-Washing Dishes By Hand While Standing At The Sink: Washing Dishes By Hand While Standing At The Sink: No Difficulty Instrumental Activities of Daily Living-Grocery Shopping: Do Grocery Shopping: No Difficulty Do You:: Use A Device Importance Of Learning New Strategies: Not At All Other Comments:: Uses manual cart at store (runs into things with electric cart) IADL Observation: Do Grocery Shopping: N/O Safety: N/O Efficiency: N/O Intervention: No Instrumental Activities of Daily Living-Use Telephone: Use Telephone: No Difficulty Instrumental Activities of Daily Living-Financial Management: Financial Management: No Difficulty Instrumental Activities of Daily Living-Medications: Take Medications: No Difficulty Instrumental Activities of Daily Living-Health Management And Maintenance: Health Management  & Maintenance: No Difficulty Instrumental Activities of Daily Living-Meal Preparation and Clean-Up: Meal Preparation and Clean-Up: No Difficulty Instrumental Activities of Daily Living-Provide Care For Others/Pets: Care For Others/Pets: No Difficulty Other Comments:: Has a [place]  Instrumental Activities of Daily Living-Take Part In Organized Social Activities: Take Part In Organized Social Activities: N/A Other Comments:: Had not been in area long before pandemic started Instrumental Activities of Daily Living-Leisure Participation: Leisure Participation: N/A Instrumental Activities of Daily Living-Employment/Volunteer Activities: Employment/Volunteer Activities: N/A Instrumental Activities of Daily Living-Other Identifies:    Readiness To Change Score:  Readiness to Change Score: 8  Home Environment Assessment: Stairs:: Needs another rail down flight of steps to basement Bathroom:: Needs grab bars in shower and at toilet as well as shower seat and hand held shower head for safety Laundry:: It is downstairs (do not see any way to easily move it up stairs due to space constraints  Durable Medical Equipment:    Patient Education: Education Provided: Yes Education Details: Getting up from fall and check for safety booklet Person(s) Educated: Patient  Goals: Goals Addressed            This Visit's Progress   . Patient Stated       I would like to be safer and more independent with getting in and out of shower and off/on toilet with appropriate DME (shower seat with back, grab bars, and hand held shower head)    . Patient Stated       I would be able to get bathed and dressed with more ease and independence using AE (ie: reacher, sock aid, long shoe horn, and long handled sponge)    . Patient Stated       I would like to be safer and more independent in managing my laundry (in the basement). Possibly an extra rail, stair, lift, or relocating washer and dryer  upstairs.    .  Patient Stated       I would like to be more independent in getting my right leg out of the car with AE (ie: leg lifter)       Post Clinical Reasoning: Clinician View Of Client Situation:: Patient was very open to OT visit. Ms. Proch is functioning relatively well given the pain in her right hip and low back. She gets around with a SPC that she has used ever since her last right hip surgery. She manages all of her ADLs and IADLs but is having increased difficulty with showering, dressing, going up and down the basement steps (where laundry is), and getting out of her car. Client View Of His/Her Situation:: Patient realizes some tasks are more diffcult for her than others and is wanting to try and make some of her basic and IADLs easier and safer for herself. She is open to trying several AE/DME/and home modifications to A her in these tasks and to allow her to live in her home as for as long as possible ("The rest of my life")

## 2019-02-21 NOTE — Patient Instructions (Signed)
Goals Addressed            This Visit's Progress   . Patient Stated       She will more safe and independent with getting in and out of shower with appropriate DME (shower seat with back, grab bars, and hand held shower head)    . Patient Stated       She will be able to get bathed and dressed with more ease and independence using AE (ie: reacher, sock aid, long shoe horn, and long handled sponge)    . Patient Stated       She will more safe and independent in going up and down her stairs to basement for laundry with an additional hand rail.    . Patient Stated       She will be more independent in getting her RLE out of the car with AE (ie: leg lifter)

## 2019-02-23 ENCOUNTER — Other Ambulatory Visit: Payer: Self-pay | Admitting: *Deleted

## 2019-02-23 NOTE — Patient Outreach (Signed)
Aging Gracefully Program  02/23/2019  Bonnie Reed [facility] Sep 13, 1945 [id number]  Placed call to Rebbeca Paul to schedule initial Aging Gracefully RN home visit; HIPAA/ identity verified.     Visit scheduled with Kayleen on Thursday March 02, 2019 at 11:00 am; verified patient's address and explained that I would contact her by phone on morning of scheduled visit prior to arriving at her home, to complete coronavirus questionnaire screening tool; confirmed that patient has a mask available at (her) home for use during scheduled visit.   Provided my direct contact information to patient, should (she) wish to contact me prior to scheduled home visit.   Plan:   Scheduled RN visit #1 visit on Thursday March 02, 2019  Oneta Rack, RN, BSN, [place] Care Management  [phone]

## 2019-03-01 ENCOUNTER — Other Ambulatory Visit: Payer: Self-pay | Admitting: Student in an Organized Health Care Education/Training Program

## 2019-03-02 ENCOUNTER — Other Ambulatory Visit: Payer: Self-pay | Admitting: Occupational Therapy

## 2019-03-02 ENCOUNTER — Encounter: Payer: Medicare Other | Admitting: *Deleted

## 2019-03-02 ENCOUNTER — Encounter: Payer: Self-pay | Admitting: *Deleted

## 2019-03-02 ENCOUNTER — Other Ambulatory Visit: Payer: Self-pay | Admitting: *Deleted

## 2019-03-02 ENCOUNTER — Other Ambulatory Visit: Payer: Self-pay

## 2019-03-02 NOTE — Patient Outreach (Signed)
Aging Gracefully Program  RN Visit  03/02/2019  Bonnie Reed [facility] 11/14/1945 [id number]  Visit:   RN Visit # 1  Start Time: 11:25 am   End Time:   1:15 pm Total Minutes:   110 minutes  [id number] screening completed at patient's home prior to initiation of visit- no concerns identified; during in-home visit, patient in mask and RN with full PPE in place: mask, face shield, gown, gloves, shoe covers; patient temperature at RN arrival: 97.5; social distancing maintained > 6 feet throughout entirety of home visit  Readiness To Change Score:  Readiness to Change Score: 8  Universal RN Interventions: Calendar Distribution: Yes Exercise Review: Yes Medications: Yes Medication Changes: No(encouraged patient to consider discussing known side effects of metformin therapy with her PCP) Mood: Yes Pain: Yes PCP Advocacy/Support: Yes Fall Prevention: Yes Incontinence: Yes  Clinician View Of Client Situation:   Bonnie Reed is independent in all of her care and ADL/ IADL's and she continues to drive herself to appointments and errands.  Bonnie Reed occasionally feels sad around not being able to socialize with her family, walk her dog outside, or go to church due to coronavirus precautions, as she used to be very active socially.  Bonnie Reed maintains her interest in hobbies and household chores and she appears to be happy and laughs often during our visit.  Although she has chronic (R) hip pain, she reports that established medications/ interventions help.  Bonnie Reed  is steady in ambulation and has a purposeful gait, using cane.  Bonnie Reed is very happy to be a part of the Aging Gracefully program and is looking forward to having home modifications completed.  Her home environment is clean, neat, and uncluttered.  Client View Of His/Her Situation:   "I believe I am doing well, but I kow I can always make improvements to be better; I would like to be able to go out to church and see my family more regularly, but I make the  most of my days by staying busy, and I don't let me hip pain stop me, even though it sometimes slows me down.  I want to stay as independent as I can for as long as I can"  Healthcare Provider Communication: Did RN [place] Provider?: No  Medication Assessment: Do You Have Any Problems Paying For Medications?: No Where Does Client Store Medications?: Bathroom("medicine cabinent in my bathroom") Can Client Read Pill Bottles?: Yes Does Client Use A Pillbox?: No(Pill box provided to patient today) Does Anyone Assist Client In Taking Medications?: No Do You Take Vitamin D?: Yes Total Number Of Medications That The Client Takes: 9 Does Client Have Any Questions Or Concerns About Medictions?: No Is Client Complaining Of Any Symptoms That Could Be Side Effects To Medications?: No Any Possible Changes In Medication Regimen?: No  OT Update: Will share today's Aging Gracefully notes/ patient goals from RN Visit # 1 with Golden Circle  Session Summary: Pleasant visit; Bonnie Reed would like to work on managing her chronic pain.  [facility] health planning calendar for 2021 provided to patient; medication pill box was provided to patient.  Action planning for patient stated goals around pain management reviewed and discussed ; hard copy provided to patient.  Aging Gracefully exercises reviewed with patient and hard copy of exercises provided.  Will ask [facility] CSW team to contact patient to discuss process to see if patient qualifies for food stamps

## 2019-03-02 NOTE — Patient Instructions (Signed)
Marland Kitchen COMPLETED: Patient Stated   On track     She will more safe and independent with getting in and out of shower with appropriate DME (shower seat with back, grab bars, and hand held shower head)  ACTION PLANNING - BATHING Target Problem Area: Decreased safety in shower  Why Problem May Occur: Patient has to stand to shower Patient does not have grab bars Patient's hand held shower is broken       Target Goal: Patient independent.Mod I and safe in showerin   STRATEGIES Saving Your Energy: DO: DON'T:  Use a tub bench/seat Stand while bathing, it uses more energy  Use appropriate adaptive equipment:  long handled sponge, soap on a rope Rush  Keep all items you'll need within easy reach          Modifying your home environment and making it safe: DO: DON'T:  Install grab bars n the shower and next to the toilet   Place a rubber mat along the entire length of the tub Place loose rugs in the bathroom- they can trip you or your walker/cane can get caught on them  Make sure the bathroom is well lit    Install a hand held shower head    Simplifying the way you set up tasks or daily routines: DO: DON'T:  Plan to bathe/shower before you're overly tired Rush through [employer] all items before getting started             PRACTICE It is important to practice the strategies so we can determine if they will be effective in helping to reach your goal. Follow these specific recommendations: 1.Do as much of your shower seated on shower seat provided today and use your long handled sponge to wash you lower legs and feet so you don't have to bend over. 2.Continue to use the bathmat you have in shower 3.Do you showering when you are not in a rush  If a strategy does not work the first time, try it again and again (and maybe again). We may make some changes over the next few sessions, based on how they work.   Golden Circle, OTR/L Acute  Rehab Services Phone [phone] Email: [email or web address]             . COMPLETED: Patient Stated   On track     She will be able to get bathed and dressed with more ease and independence using AE (ie: reacher, sock aid, long shoe horn, and long handled sponge) ACTION PLANNING - DRESSING Target Problem Area: Difficulty dressing RLE and bathing lower legs and feet  Why Problem May Occur: Pt has had a previous hip surgery and hip "has never been right since". Painful and intermittently more so.       Target Goal: To be able to get bathed and dressed with less pain and independently/Modified independent (with equipment)    STRATEGIES Saving Your Energy: DO: DON'T:  Sit down to do tasks  Stand too long while dressing  Use appropriate adaptive equipment:  long handled shoe horn, sock aide, dressing stick, reacher Stand to use the adaptive equipment  Keep all items you'll need within easy reach Be in a rush         Modifying your home environment and making it safe: DO: DON'T:            Simplifying the way you set up tasks or daily routines: DO: DON'T:  Wear sturdy shoes that grip the floor  and have a back on them Wear shoes that you can easily step out of, may cause you to fall  Gather all items before getting started   Wear clothing that is easily manipulated              PRACTICE It is important to practice the strategies so we can determine if they will be effective in helping to reach your goal. Follow these specific recommendations: 1. Sit to bath and dress as much as possible 2.  Use the adaptive equipment provided today to help you with your bathing and dressing 3.  Don't be in a rush with these tasks  If a strategy does not work the first time, try it again and again (and maybe again). We may make some changes over the next few sessions, based on how they work.   Golden Circle,  OTR/L Acute Rehab Services Phone: [phone] Email: [email or web address]        . COMPLETED: Patient Stated   On track     She will be more independent in getting her RLE out of the car with AE (ie: leg lifter)  ACTION PLANNING - CUSTOM Target Problem Area: Getting out of car safely  Why Problem May Occur: Patient's right hip hurts her (has had previous surgery)      Target Goal: Be able to get out of car safely and with greater ease   STRATEGIES Saving Your Energy: DO: DON'T:  Take your time                Modifying your home environment and making it safe: DO: DON'T:  Use leg lifter Get left leg out and then try to stand up to get right leg out  Get both legs out of car before trying to stand up    \         Simplifying the way you set up tasks or daily routines: DO: DON'T:  Move slowly Rush during transfers          Practice It is important to practice the strategies so we can determine if they will be effective in helping to reach your goal. Follow these specific recommendations: 1.Take your time 2.Use leg lifter to get right leg out of car 3.Get both legs out before you try to stand up  If a strategy does not work the first time, try it again and again (and maybe again). We may make some changes over the next few sessions, based on how they work.   Golden Circle, OTR/L Acute Rehab Services Phone [phone] Email: [email or web address]

## 2019-03-02 NOTE — Patient Outreach (Signed)
Aging Gracefully Program  OT Follow-Up Visit  03/02/2019  Bonnie Reed [birth year] [id number]  Visit:  2- Second Visit  Start Time:  1000 End Time:  1100 Total Minutes:  13  Durable Medical Equipment: Durable Medical Equipment: Shower Chair With Back Durable Medical Equipment Distribution Date: 03/02/19 Adaptive Equipment: Leg Lifter, Long Handled Shoehorn, Long Handled Sponge, [place], Sock Aid Adaptive Equipment Distribution Date: 03/02/19  Patient Education:    Goals:  Goals Addressed            This Visit's Progress   . COMPLETED: Patient Stated   On track    She will more safe and independent with getting in and out of shower with appropriate DME (shower seat with back, grab bars, and hand held shower head)  ACTION PLANNING - BATHING Target Problem Area: Decreased safety in shower  Why Problem May Occur: Patient has to stand to shower Patient does not have grab bars Patient's hand held shower is broken       Target Goal: Patient independent.Mod I and safe in showerin   STRATEGIES Saving Your Energy: DO: DON'T:  Use a tub bench/seat Stand while bathing, it uses more energy  Use appropriate adaptive equipment:  long handled sponge, soap on a rope Rush  Keep all items you'll need within easy reach          Modifying your home environment and making it safe: DO: DON'T:  Install grab bars n the shower and next to the toilet   Place a rubber mat along the entire length of the tub Place loose rugs in the bathroom- they can trip you or your walker/cane can get caught on them  Make sure the bathroom is well lit    Install a hand held shower head    Simplifying the way you set up tasks or daily routines: DO: DON'T:  Plan to bathe/shower before you're overly tired Rush through [employer] all items before getting started             PRACTICE It is important to practice the strategies so we can determine if they will be effective  in helping to reach your goal. Follow these specific recommendations: 1.Do as much of your shower seated on shower seat provided today and use your long handled sponge to wash you lower legs and feet so you don't have to bend over. 2.Continue to use the bathmat you have in shower 3.Do you showering when you are not in a rush  If a strategy does not work the first time, try it again and again (and maybe again). We may make some changes over the next few sessions, based on how they work.   Golden Circle, OTR/L Acute Rehab Services Phone [phone] Email: [email or web address]             . COMPLETED: Patient Stated   On track    She will be able to get bathed and dressed with more ease and independence using AE (ie: reacher, sock aid, long shoe horn, and long handled sponge) ACTION PLANNING - DRESSING Target Problem Area: Difficulty dressing RLE and bathing lower legs and feet  Why Problem May Occur: Pt has had a previous hip surgery and hip "has never been right since". Painful and intermittently more so.       Target Goal: To be able to get bathed and dressed with less pain and independently/Modified independent (with equipment)    STRATEGIES Saving Your Energy: DO: DON'T:  Sit  down to do tasks  Stand too long while dressing  Use appropriate adaptive equipment:  long handled shoe horn, sock aide, dressing stick, reacher Stand to use the adaptive equipment  Keep all items you'll need within easy reach Be in a rush         Modifying your home environment and making it safe: DO: DON'T:            Simplifying the way you set up tasks or daily routines: DO: DON'T:  Wear sturdy shoes that grip the floor and have a back on them Wear shoes that you can easily step out of, may cause you to fall  Gather all items before getting started   Wear clothing that is easily manipulated              PRACTICE It is important to practice the strategies so we  can determine if they will be effective in helping to reach your goal. Follow these specific recommendations: 1. Sit to bath and dress as much as possible 2.  Use the adaptive equipment provided today to help you with your bathing and dressing 3.  Don't be in a rush with these tasks  If a strategy does not work the first time, try it again and again (and maybe again). We may make some changes over the next few sessions, based on how they work.   Golden Circle, OTR/L Acute Rehab Services Phone: [phone] Email: [email or web address]        . COMPLETED: Patient Stated   On track    She will be more independent in getting her RLE out of the car with AE (ie: leg lifter)  ACTION PLANNING - CUSTOM Target Problem Area: Getting out of car safely  Why Problem May Occur: Patient's right hip hurts her (has had previous surgery)      Target Goal: Be able to get out of car safely and with greater ease   STRATEGIES Saving Your Energy: DO: DON'T:  Take your time                Modifying your home environment and making it safe: DO: DON'T:  Use leg lifter Get left leg out and then try to stand up to get right leg out  Get both legs out of car before trying to stand up    \         Simplifying the way you set up tasks or daily routines: DO: DON'T:  Move slowly Rush during transfers          Practice It is important to practice the strategies so we can determine if they will be effective in helping to reach your goal. Follow these specific recommendations: 1.Take your time 2.Use leg lifter to get right leg out of car 3.Get both legs out before you try to stand up  If a strategy does not work the first time, try it again and again (and maybe again). We may make some changes over the next few sessions, based on how they work.   Golden Circle, OTR/L Acute Rehab Services Phone [phone] Email: [email or web address]            Post  Clinical Reasoning: Client Action (Goal) One Interventions: Bonnie Reed will be safer getting in and out and while in shower with use of shower seat, grab bars, and hand held shower head Did Client Try?: Yes Targeted Problem Area Status: A Little Better(delivered shower chair; waiting on grab bar installation and hand held  shower head and hand held shower head to be fixed) Client Action (Goal) Two Interventions: Bonnie Reed will be able to get bathed and dressed safer and more independently with reacher, sock aid, long handled shoe horn, and long handled sponge Did Client Try?: Yes Targeted Problem Area Status: A Lot Better(Bonnie Reed used reacher to doff socks, sock aid to donn socks, and log handled shoe horn to donn slip on shoes. She simulated using long handled sponge) Client Action (Goal) Three Interventions: Bonnie Reed will fell safer with going up and down steps to do laundry in basement (ie: options a second rail, stair lift, or moving washer/dryer up stairs) Did Client Try?: (Not addressed this session due to home modifications not in place as of yet) Client Action (Goal) Four Interventions: Bonnie Reed will be more independent with getting her right leg in out of the car using a leg lifter. Did Client Try?: Yes(Pt used the leg lifter to lift up her RLE as if she was lifting it to get out of the car) Targeted Problem Area Status: A Lot Better Clinician View Of Client Situation:: Bonnie Reed did very well in learning to use adaptive equipment--she aced it!!!!. She is now able to doff/donn her socks and shoes with increased ease, reach her feet and back while showering, and lift her right leg with increased ease to get it out of the car. In addtion Bonnie Reed was able to get into and out of her walk in shower to shower seat with back with little effort (the grab bars and hand held shower fix to be completed will make this even more safer and independent for Bonnie Reed) Ambulance person Of His/Her  Situation:: Bonnie Reed was very happy with all of her equipment. "Oh this is much easier"--in reference to donning her socks/shoes. "I'll stay in forever now"--in reference to being able to sit to shower. Not ready to practice getting up from a fall today--due to pain in her side. Next Visit Plan:: Next vist is planned at 4 weeks out in hopes that at least some of the home modifications are completed and see if Bonnie Reed is ready to address getting up off floor from fall.

## 2019-03-02 NOTE — Patient Instructions (Addendum)
Kaislee, It was so nice to meet you today  Try to begin the Aging Gracefully exercises we reviewed- go slow and gradually increase each week; please review the action plan we developed for pain management below, as we dicussed today; if you find additional strategies that work- please write them down so we can discuss at our next visit  I have asked a Education officer, museum from [place] [employer] to contact you to discuss the process for determining if you are eligible for food stamps  I will call you soon to scheduled our second visit together    CLIENT/RN ACTION PLAN - PAIN  Registered Nurse:  Reginia Naas, RN Date: March 02, 2019  Client Name: Bonnie Reed Client ID:    Target Area:  PAIN   Why Problem May Occur:  Chronic (R) hip pain/ (R) lower extremity arthritis    Target Goal: "Manage my (R) hip pain so I can stay as independent as possible and do the things I need and want to do"    STRATEGIES Coping Strategies Ideas:  Heat . Use heating pad or warm towel on painful area no more than 20 minutes at a time. . Don't sleep with a heating pad on - it could burn your skin . Warm showers  Ice . Use ice pack or frozen bag of vegetables on painful area.   . Leave cold pack on for less than 20 minutes. [name] can burn your skin.  Don't leave ice on longer than 20 minutes. .   Activity and Exercise . Joints get stiff when not in use . Aging Gracefully Exercises . Walking (inside or outside) . Dancing  . Gardening . Housework:  cooking, Education administrator, Medical sales representative . Swimming . Walking dogs when weather is nice . Consider online exercise programs as we discussed  Listen to Music . Listening to music can decrease pain. . Turn off the TV and turn on the radio. .   Prayer/Meditation . Prayer and meditation can decrease pain .   Other .   Acetaminophen/Tylenol (same medication) . DO NOT take more Acetaminophen than below because it can be bad for your liver. o 500 mg. tablets:  2 tablets  every 8 hours, as needed for pain.  Do not take more than 6, (500 mg) tablets every day. o 300 mg. tablets:  2 tablets every 6 hours, as needed for pain.  Do not take more than 8 (325 mg) tablets every day. . Look for Acetaminophen/Tylenol in other medicines you buy over the counter. .   Still in Pain . There ae a lot of different kinds of pain medicines:  creams, patches and supplements. . Ask your Healthcare Provider about other pain medications. .   Stop Smoking . Smoking can make arthritis worse. Marland Kitchen   Stop Pain . Before it gets bad. . Once in pain It is harder to get rid of. . Begin pain relief while you have mild pain. .   Other .   Other .    ;  PRACTICE It is important to practice the strategies so we can determine if they will be effective in helping to reach the goal.    Follow these specific recommendations:  -- try to start doing the Aging Gracefully exercises we discussed; start slowly and progress gradually; do not do any exercises that cause you pain -- consider doing online exercise programs for seniors, as we discussed -- consider starting to walk your dogs again when weather is nice,  if you feel comfortable walking outside while following standard precautions for corona virus -- consider making an appointment with an orthopedic specialist to evaluate your pain -- consider re-positioning your computer desk top monitor to make sure your body is in good alignment, without twisting; try to keep your arms at your side in natural position for typing, as we discussed during our visit -- call your doctor if there is a change in your usual pain level/ if pain increases -- notify your doctor if you experience any new falls, even if you are not seriously hurt -- try not to rush at any time; take your time and move slowly and deliberately     If strategy does not work the first time, try it again.  We may make some changes over the next few sessions.    We may make some changes  over the next few sessions, based on how they work.    Oneta Rack, RN, BSN, [employer] [facility] Care Management  [phone]

## 2019-03-03 ENCOUNTER — Other Ambulatory Visit: Payer: Self-pay

## 2019-03-03 NOTE — Patient Outreach (Signed)
[facility]) Care Management  03/03/2019  Bonnie Reed Sep 29, 1945 [id number]   Social work referral received from Reginia Naas to outreach patient regarding process for applying for [employer] and food stamps. Successful outreach to patient today.  Discussed application process and informed patient that this is processed through Department of Social Services.  Informed patient that applications can be placed in drop box at DSS or mailed.   Patient declined Medicaid application being sent to her but did ask that Application for Food and Nutrition Services be mailed. Will follow up within the next two weeks to ensure receipt.  Ronn Melena, BSW Social Worker [phone]

## 2019-03-06 ENCOUNTER — Encounter: Payer: Medicare Other | Admitting: Occupational Therapy

## 2019-03-10 ENCOUNTER — Other Ambulatory Visit: Payer: Self-pay | Admitting: Student in an Organized Health Care Education/Training Program

## 2019-03-14 ENCOUNTER — Other Ambulatory Visit: Payer: Self-pay

## 2019-03-14 NOTE — Patient Outreach (Signed)
[place] [facility]) Care Management  03/14/2019  Bonnie Reed [facility] Mar 17, 1946 [id number]   Successful follow up to patient today.  She did receive Application for Food and Nutrition Services and denied need for further assistance at this time. Social work case being closed but encouraged her to call if additional needs arise.  Ronn Melena, BSW Social Worker [phone]

## 2019-03-23 ENCOUNTER — Other Ambulatory Visit: Payer: Self-pay | Admitting: *Deleted

## 2019-03-23 ENCOUNTER — Other Ambulatory Visit: Payer: Self-pay | Admitting: Student in an Organized Health Care Education/Training Program

## 2019-03-23 DIAGNOSIS — Z794 Long term (current) use of insulin: Secondary | ICD-10-CM

## 2019-03-23 DIAGNOSIS — E118 Type 2 diabetes mellitus with unspecified complications: Secondary | ICD-10-CM

## 2019-03-23 NOTE — Patient Outreach (Addendum)
Aging Gracefully Program  03/23/2019  Milada Desforges 1946/01/23 [id number]  Unsuccessful outreach placed to patient to schedule Aging Gracefully RN Visit # 2; left patient HIPAA compliant voice message requesting call back to schedule visit  2:40 pm:  Patient returned my call from earlier today; RN Visit # 2 scheduled with patient for afternoon of April 05, 2018; reminded patient that I would contact her by phone prior to arriving at her home to perform corona virus screening questions; patient is agreeable to this  Plan:  Aging Gracefully RN visit # 2 scheduled for April 05, 2018  Oneta Rack, RN, BSN, [employer] [facility] Care Management  [phone]

## 2019-03-30 ENCOUNTER — Encounter: Payer: Medicare Other | Admitting: Occupational Therapy

## 2019-04-06 ENCOUNTER — Other Ambulatory Visit: Payer: Self-pay | Admitting: *Deleted

## 2019-04-06 NOTE — Patient Outreach (Signed)
Aging Gracefully Program  04/06/2019  Arlesia Zaccaria 02-Mar-1946 [id number]  Was notified by Aging Gracefully team OT that patient had cancelled previously scheduled OT visit # 2 last week due to feeling sick, and that patient reported to OT that she was going to be tested for presence of corona virus.  11:00 am: Placed call to patient to perform covid screening tool, vs. possibly complete by telephone previously scheduled RN visit # 2 at patient's home this afternoon.  HIPAA compliant voice mail message left for patient, requesting return call back.  Unfortunately, I did not receive call-back from patient by end of business hours today, and therefore will cancel previously scheduled appointment for RN Visit # 2  Plan:  Will re-attempt Aging Gracefully RN call to patient again next week, hopefully to schedule today's missed appointment for RN Visit # [place], RN, BSN, [place] Coordinator [facility] Care Management  [phone]

## 2019-04-17 ENCOUNTER — Other Ambulatory Visit: Payer: Self-pay | Admitting: *Deleted

## 2019-04-17 NOTE — Patient Outreach (Signed)
Aging Gracefully Program  04/17/2019  Bonnie Reed 02-Jun-1945 [id number]  Second unsuccessful attempt to contact patient to either schedule Aging Gracefully RN visit # 2 in person, or by phone;  HIPAA compliant voice mail message left for patient, requesting return call back  Plan:  Will re-attempt Aging Gracefully RN call to patient again within one week, hopefully to schedule or complete by phone RN Visit # [place], RN, BSN, [place] Coordinator [facility] Care Management  [phone]

## 2019-04-18 ENCOUNTER — Other Ambulatory Visit: Payer: Self-pay | Admitting: *Deleted

## 2019-04-18 NOTE — Patient Outreach (Signed)
Aging Gracefully Program  04/18/2019  Bonnie Reed 12-19-1945 [id number]  Received incoming call/ voice message from patient after today's Aging Gracefully OT visit; returned call to patient within 35 minutes of her call to me, but was again unsuccessful in connecting with patient  Third unsuccessful attempt to contact patient to either schedule Aging Gracefully RN visit # 2 in person, or by phone;  HIPAA compliant voice mail message left for patient, requesting return call back; explained to patient that I would re-attempt another outreach call to her tomorrow in hopes of successfully connecting with her, to complete RN Visit # 2 by phone   Plan:  Will re-attemptAging Gracefully RN call to patient again tomorrow afternoon, hopefully to schedule or complete by phone RN Visit # [place], RN, BSN, [place] Coordinator [facility] Care Management  [phone]

## 2019-04-19 ENCOUNTER — Other Ambulatory Visit: Payer: Self-pay | Admitting: *Deleted

## 2019-04-19 NOTE — Patient Instructions (Signed)
Bonnie Reed, it was very nice to talk to you today!  I am very glad that you have noticed an improvement in your pain and believe that the strategies we discussed at our first visit are helping!  Please see the updated strategies that we discussed today (in bold red text), and keep up the great work!  I look forward to seeing you on Thursday May 11, 2019 around 1:00 pm at your home for our third visit together-- don't forget that I will call you first to complete the corona-virus screening questionnaire  CLIENT/RN ACTION PLAN - PAIN  Registered Nurse:  Reginia Naas RN Date:April 19, 2019  Client Name: Bonnie Reed Client ID:    Target Area:  PAIN   Why Problem May Occur:  Chronic (R) hip pain/ (R) lower extremity arthritis    Target Goal: "Manage my (R) hip pain so I can stay as independent as possible and do the things I need and want to do"    STRATEGIES Coping Strategies Ideas:  Heat . Use heating pad or warm towel on painful area no more than 20 minutes at a time. . Don't sleep with a heating pad on - it could burn your skin . Warm showers  Ice . Use ice pack or frozen bag of vegetables on painful area.   . Leave cold pack on for less than 20 minutes. Bonnie Reed can burn your skin.  Don't leave ice on longer than 20 minutes. .   Activity and Exercise . Joints get stiff when not in use . Aging Gracefully Exercises . Walking (inside or outside) . Dancing  . Gardening . Housework:  cooking, Education administrator, Medical sales representative . Swimming . Walking dogs when weather is nice . Consider online exercise programs as we discussed  Listen to Music . Listening to music can decrease pain. . Turn off the TV and turn on the radio. .   Prayer/Meditation . Prayer and meditation can decrease pain .   Other .   Acetaminophen/Tylenol (same medication) . DO NOT take more Acetaminophen than below because it can be bad for your liver. o 500 mg. tablets:  2 tablets every 8 hours, as needed for pain.  Do not take  more than 6, (500 mg) tablets every day. o 300 mg. tablets:  2 tablets every 6 hours, as needed for pain.  Do not take more than 8 (325 mg) tablets every day. . Look for Acetaminophen/Tylenol in other medicines you buy over the counter. .   Still in Pain . There ae a lot of different kinds of pain medicines:  creams, patches and supplements. . Ask your Healthcare Provider about other pain medications. .   Stop Smoking . Smoking can make arthritis worse. Marland Kitchen   Stop Pain . Before it gets bad. . Once in pain It is harder to get rid of. . Begin pain relief while you have mild pain. .   Other .   Other .    ;  PRACTICE It is important to practice the strategies so we can determine if they will be effective in helping to reach the goal.    Follow these specific recommendations:  -- now that you have start doing the Aging Gracefully exercises we discussed during our first visit, try to gradually increase the time and amount you do each week; always start conservatively and gradually build up; do not do any exercises that cause you pain, and immediately stop if you experience pain while exercising -- consider doing  online exercise programs for seniors, as we discussed -- consider starting to walk your dogs again when weather is nice, if you feel comfortable walking outside while following standard precautions for corona virus -- consider making an appointment with an orthopedic specialist to evaluate your pain -- consider re-positioning your computer desk top monitor to make sure your body is in good alignment, without twisting; try to keep your arms at your side in natural position for typing, as we discussed during our first visit: I am so glad to hear that this strategy was effective!  :) -- call your doctor if there is a change in your usual pain level/ if pain increases -- notify your doctor if you experience any new falls, even if you are not seriously hurt; continue using your cane as needed  and proactively take your cane with you, even if think you may need to use it-- so you will have it, in case you do -- try not to rush at any time; take your time and move slowly and deliberately     If strategy does not work the first time, try it again.  We may make some changes over the next few sessions.    We may make some changes over the next few sessions, based on how they work.   Bonnie Rack, RN, BSN, [employer] [facility] Care Management  [phone]

## 2019-04-19 NOTE — Patient Outreach (Signed)
Aging Psychiatrist Visit # 2- by phone  04/19/2019  [place] 1945-09-21 [id number]  Visit:   RN Visit # 2; completed by phone  Start Time:   15:20 pm End Time:   15:50 pm Total Minutes:   30 minutes   Universal RN Interventions: Calendar Distribution: (Reviewed use of calendar with client) Exercise Review: (Reviewed exercises with client) Medications: (Reviewed use of pill box with client) Medication Changes: No(Confirmed no recent medication changes) Pain: (Reviewed previously recommended pain strategies with client) Fall Prevention: (Reviewed fall prevention measures with client)  Healthcare Provider Communication: Did Higher education careers adviser With [employer] Provider?: No  Clinician View of Client Situation: Clinician View Of Client Situation: Bonnie Reed remains independent in self-care and continues driving self to all appointments/ errands; she reports doing well and recovering from recent sinus infection; denies problems, concerns, and new/ recent falls.  Today, we reviewed together the previous strategies we had discussed for pain management and [name] reports that she has seen a noticeable improvement improvement in her pain since she began initiating the strategies that were implemented during RN Visit # 1 along with OT interventions and home modifications that have thus far been completed.  [name] is definitely on track to meeting her previously stated Aging Gracefully goals   Client's View of His/Her Situation: Client View Of His/Her Situation: "[name] and have seen a big improvement since I started doing the exercises and using the shower chair; re-positioning my computer screen has been a big help, my neck is much less sore than it was before.  I am still hoping to make an appointment with the orthopedic doctor, but this is less urgent, because my overall pain is better.  I have not had any falls and use my cane only when I need to.  I am using the  pill box and the calendar, and both make everything so much easier and more manageable."  OT Update: Will update Aging Gracefully team on today's successful completion of RN visit # 2  Session Summary: RN Visit # 2 completed by phone with client today; client is on track to meeting goals and believes that she is making good progress toward managing her pain more effectively.  We reviewed all specific strategies developed during RN Visit # 1 and brainstormed during our phone call to modify a few of the established strategies.  RN Visit # 3 scheduled today to be completed in person on May 11, 2019  Oneta Rack, RN, BSN, [place] Care Management  [phone]

## 2019-04-30 ENCOUNTER — Other Ambulatory Visit: Payer: Self-pay | Admitting: Family Medicine

## 2019-04-30 DIAGNOSIS — Z794 Long term (current) use of insulin: Secondary | ICD-10-CM

## 2019-04-30 DIAGNOSIS — E118 Type 2 diabetes mellitus with unspecified complications: Secondary | ICD-10-CM

## 2019-05-09 ENCOUNTER — Other Ambulatory Visit: Payer: Self-pay | Admitting: *Deleted

## 2019-05-09 NOTE — Patient Outreach (Signed)
Aging Gracefully Program  05/09/2019  Bonnie Reed Oct 18, 1945 [id number]  Successful outreach call placed to client Bonnie Reed in response to message she left for me stating that she had to cancel upcoming scheduled Aging Gracefully RN Visit # 3 this week; client reports that she is scheduled to receive her first corona virus vaccine shot on Thursday 05/11/19 and she requests re-schedule of Aging Gracefully RN visit; visit successfully re-scheduled around client preference  Plan:  Aging Gracefully RN Visit # 3 re-scheduled for Friday May 19, 2019  Oneta Rack, RN, BSN, [place] Care Management  [phone]

## 2019-05-11 ENCOUNTER — Encounter: Payer: Medicare Other | Admitting: *Deleted

## 2019-05-16 ENCOUNTER — Other Ambulatory Visit: Payer: Self-pay

## 2019-05-16 ENCOUNTER — Other Ambulatory Visit: Payer: Self-pay | Admitting: Occupational Therapy

## 2019-05-16 NOTE — Patient Instructions (Signed)
Marland Kitchen COMPLETED: Patient Stated         She will more safe and independent with getting in and out of shower with appropriate DME (shower seat with back, grab bars, and hand held shower head)  ACTION PLANNING - BATHING Target Problem Area: Decreased safety in shower  Why Problem May Occur: Patient has to stand to shower Patient does not have grab bars Patient's hand held shower is broken       Target Goal: Patient independent.Mod I and safe in showerin   STRATEGIES Saving Your Energy: DO: DON'T:  Use a tub bench/seat Stand while bathing, it uses more energy  Use appropriate adaptive equipment:  long handled sponge, soap on a rope Rush  Keep all items you'll need within easy reach          Modifying your home environment and making it safe: DO: DON'T:  Install grab bars n the shower and next to the toilet   Place a rubber mat along the entire length of the tub Place loose rugs in the bathroom- they can trip you or your walker/cane can get caught on them  Make sure the bathroom is well lit    Install a hand held shower head    Simplifying the way you set up tasks or daily routines: DO: DON'T:  Plan to bathe/shower before you're overly tired Rush through [employer] all items before getting started             PRACTICE It is important to practice the strategies so we can determine if they will be effective in helping to reach your goal. Follow these specific recommendations: 1.Do as much of your shower seated on shower seat provided today and use your long handled sponge to wash you lower legs and feet so you don't have to bend over. 2.Continue to use the bathmat you have in shower 3.Do you showering when you are not in a rush   If a strategy does not work the first time, try it again and again (and maybe again). We may make some changes over the next few sessions, based on how they work.   [place] Occupational  Therapist [phone]    ACTION [place] (05/16/2019) Target Problem Area: Safety in shower  Why Problem May Occur: No grab bars, no hand held shower. Has these now.       Target Goal: Safety and independence in shower   STRATEGIES Saving Your Energy: DO: DON'T:  Use a tub bench/seat Stand while bathing, it uses more energy  Use appropriate adaptive equipment:  long handled sponge [place]  Keep all items you'll need within easy reach          Modifying your home environment and making it safe: DO: DON'T:  Use grab bars recently installed while in shower and when getting in and out Hold onto unsteady items in bathroom while getting in and out of shower  Place a rubber mat along the entire length of the tub Place loose rugs in the bathroom- they can trip you or your walker/cane can get caught on them  Make sure the bathroom is well lit    Install a hand held shower head    Simplifying the way you set up tasks or daily routines: DO: DON'T:  Plan to bathe/shower before you're overly tired Rush through [employer] all items before getting started             PRACTICE It  is important to practice the strategies so we can determine if they will be effective in helping to reach your goal. Follow these specific recommendations: 1. Always hold onto grab bars getting in and out of shower. 2.   Sit to do most of your shower. 3.   When standing hold onto grab bar with one hand while you wash with the other, same goes for hand held shower (hold onto grab bar with hand while using hand held shower if you are standing,  If a strategy does not work the first time, try it again and again (and maybe again). We may make some changes over the next few sessions, based on how they work.  [facility] Occupational Therapist [phone]             . Patient Stated   On track     She will more safe and  independent in going up and down her stairs to basement for laundry with an additional hand rail.    ACTION PLANNING - CUSTOM  Target Problem Area: Safety with laundry  Why Problem May Occur: Laundry is down stairs, orginally only one hand rail to go up /down stairs. Now there are two.        Target Goal: Safety and independence up and down steps to laundry room   STRATEGIES Saving Your Energy: DO: DON'T:  Plan to do laundry on a day that nothing else is scheduled Be in a rush to do laundry  Sit fold laundry             Modifying your home environment and making it safe: DO: DON'T:  Have an extra hand rail installed for up.down steps to laundry room Only use one hand rail up and down steps  Use strap on your cane to place can on your wrist so that you can hold onto both handrails equally going up and down the steps Try and hold cane in your hand while going up and down the steps  Consider using a wheeled cart to take your laundry to basement via ramp and sidewalk to outdoor entrance on nice days so don;t need to use steps all the time.. Use wheeled cart up and down the steps.         Practice It is important to practice the strategies so we can determine if they will be effective in helping to reach your goal. Follow these specific recommendations: 1.Use both hand rails going up and down steps (not hand rail and cane) 2. Place cane strap on your wrist when you go up and down steps   If a strategy does not work the first time, try it again and again (and maybe again). We may make some changes over the next few sessions, based on how they work.   [facility] Occupational Therapist

## 2019-05-16 NOTE — Patient Outreach (Signed)
Aging Gracefully Program  OT FINAL Visit  05/16/2019  [place] 09-13-1945 [id number]  Visit: Final Visit  Start Time:  1200 End Time:  [id number] Total Minutes:  74         Readiness to Change:  Readiness to Change Score: 8.67   Patient Education: Education Provided: Yes Education Details: "Tips for aging at home" book Person(s) Educated: Patient Comprehension: Verbalized Understanding  Goals: Goals Addressed            This Visit's Progress   . COMPLETED: Patient Stated       She will more safe and independent with getting in and out of shower with appropriate DME (shower seat with back, grab bars, and hand held shower head)  ACTION PLANNING - BATHING Target Problem Area: Decreased safety in shower  Why Problem May Occur: Patient has to stand to shower Patient does not have grab bars Patient's hand held shower is broken       Target Goal: Patient independent.Mod I and safe in showerin   STRATEGIES Saving Your Energy: DO: DON'T:  Use a tub bench/seat Stand while bathing, it uses more energy  Use appropriate adaptive equipment:  long handled sponge, soap on a rope [place]  Keep all items you'll need within easy reach          Modifying your home environment and making it safe: DO: DON'T:  Install grab bars n the shower and next to the toilet   Place a rubber mat along the entire length of the tub Place loose rugs in the bathroom- they can trip you or your walker/cane can get caught on them  Make sure the bathroom is well lit    Install a hand held shower head    Simplifying the way you set up tasks or daily routines: DO: DON'T:  Plan to bathe/shower before you're overly tired Rush through [employer] all items before getting started             PRACTICE It is important to practice the strategies so we can determine if they will be effective in helping to reach your goal. Follow these specific recommendations: 1.Do as much  of your shower seated on shower seat provided today and use your long handled sponge to wash you lower legs and feet so you don't have to bend over. 2.Continue to use the bathmat you have in shower 3.Do you showering when you are not in a rush   If a strategy does not work the first time, try it again and again (and maybe again). We may make some changes over the next few sessions, based on how they work.   [facility] Occupational Therapist [phone]    ACTION [place] (05/16/2019) Target Problem Area: Safety in shower  Why Problem May Occur: No grab bars, no hand held shower. Has these now.       Target Goal: Safety and independence in shower   STRATEGIES Saving Your Energy: DO: DON'T:  Use a tub bench/seat Stand while bathing, it uses more energy  Use appropriate adaptive equipment:  long handled sponge [place]  Keep all items you'll need within easy reach          Modifying your home environment and making it safe: DO: DON'T:  Use grab bars recently installed while in shower and when getting in and out Hold onto unsteady items in bathroom while getting in and out of shower  Place a rubber mat along the  entire length of the tub Place loose rugs in the bathroom- they can trip you or your walker/cane can get caught on them  Make sure the bathroom is well lit    Install a hand held shower head    Simplifying the way you set up tasks or daily routines: DO: DON'T:  Plan to bathe/shower before you're overly tired Rush through [employer] all items before getting started             PRACTICE It is important to practice the strategies so we can determine if they will be effective in helping to reach your goal. Follow these specific recommendations: 1. Always hold onto grab bars getting in and out of shower. 2.   Sit to do most of your shower. 3.   When standing hold onto grab bar with one hand while you wash with the other, same goes for  hand held shower (hold onto grab bar with hand while using hand held shower if you are standing,  If a strategy does not work the first time, try it again and again (and maybe again). We may make some changes over the next few sessions, based on how they work.  [facility] Occupational Therapist [phone]             . Patient Stated   On track    She will more safe and independent in going up and down her stairs to basement for laundry with an additional hand rail.    ACTION PLANNING - CUSTOM  Target Problem Area: Safety with laundry  Why Problem May Occur: Laundry is down stairs, orginally only one hand rail to go up /down stairs. Now there are two.        Target Goal: Safety and independence up and down steps to laundry room   STRATEGIES Saving Your Energy: DO: DON'T:  Plan to do laundry on a day that nothing else is scheduled Be in a rush to do laundry  Sit fold laundry             Modifying your home environment and making it safe: DO: DON'T:  Have an extra hand rail installed for up.down steps to laundry room Only use one hand rail up and down steps  Use strap on your cane to place can on your wrist so that you can hold onto both handrails equally going up and down the steps Try and hold cane in your hand while going up and down the steps  Consider using a wheeled cart to take your laundry to basement via ramp and sidewalk to outdoor entrance on nice days so don;t need to use steps all the time.. Use wheeled cart up and down the steps.         Practice It is important to practice the strategies so we can determine if they will be effective in helping to reach your goal. Follow these specific recommendations: 1.Use both hand rails going up and down steps (not hand rail and cane) 2. Place cane strap on your wrist when you go up and down steps   If a strategy does not work the first time, try it again and again (and maybe again). We may make some  changes over the next few sessions, based on how they work.   [facility] Occupational Therapist [phone]              Post Clinical Reasoning: Client Action (Goal) One Interventions: Ms Gillem will be  safer getting in and out and while in shower with use of shower seat, grab bars, and hand held shower Targeted Problem Area Status: A Lot Better Did Client Try?: (She tried last session with only shower seat doing well and she now reports that her grab bars make her feel even more safe when she is in the shower.) Client Action (Goal) Three Interventions: Ms Crump will feel safer with going up and down the steps to do her laundry in the basement Did Client Try?: Yes(She felt so much more confident having both hand rails to hold onto) Targeted Problem Area Status: A Lot Better Clinician View Of Client Situation:: Ms. Sandberg continues to use her AE for lower body bathing and dressing and her shower seat for sitting to take a shower. She now has grab bars and a hand held shower head installed in her shower and is so happy with them as well as the grab bar in front of her toliet--"this helps alot". The extra hand rail down the flight of steps to her basement to where her laundry room is makes this task so much safer for her. Client View Of His/Her Situation:: Ms Biscardi reports that her adaptive equipment given to her last session has really helped her by making her getting bathed and dressed much easier as well as the shower seat has worked well for her. She reports the grab bars that have been installed make her feel even more safe in the shower. The new handrail to the basement is wonderful, " it so much easier up and down the steps now". Ms. Casali reports she is happy with the equipment and home modifications. Next Visit Plan:: This the final visit

## 2019-05-19 ENCOUNTER — Other Ambulatory Visit: Payer: Self-pay

## 2019-05-19 ENCOUNTER — Encounter: Payer: Self-pay | Admitting: *Deleted

## 2019-05-19 ENCOUNTER — Other Ambulatory Visit: Payer: Self-pay | Admitting: *Deleted

## 2019-05-19 NOTE — Patient Outreach (Addendum)
Aging Psychiatrist Visit # 3- final visit: patient has successfully met her Aging Gracefully goals  05/19/2019  [facility] [birth year] [id number]  Visit:   RN Visit # 3- final visit  Aging Gracefully RN Visit # 3: [id number] screening completed by telephone prior to visit at patient's home; during in-home visit, patient in mask and RN with full PPE in place: mask, face shield, gown, gloves, shoe covers; patient temperature at RN arrival: 98.3 social distancing maintained > 6 feet throughout entirety of home visit  Start Time:   12:40 pm End Time:   13:00 pm Total Minutes:   20 minutes  Readiness To Change Score:  Readiness to Change Score: 9.33  Universal RN Interventions: Calendar Distribution: Yes(Reviewed with client; client reports using calendar regularly) Exercise Review: Yes(client reports regularly doing Aging Gracefully exercises) Pain: Yes(Client reports improvement in overall pain levels) Fall Prevention: Yes(reviewed with client fall prevention measures; client denies new/ recent falls)  Healthcare Provider Communication: Did Higher education careers adviser With [employer] Provider?: No  Public affairs consultant of Client Situation: Public affairs consultant Of Client Situation: Bonnie Reed has made great progress in successfully accomplishing her previously established Aging Gracefully goals;  she reports a decreased overall level of pain and confirms that she has been using strategies we discussed during previous visits, which have helped her make progress.  reports home modificatins are almost completed and along with Aging Gracefully OT interventions, have already made a big difference in her ability to maintain safety in her home and tolerate chronic pain.  Bonnie Reed verbalizes increased confidence in meeting her Aging Gracefully goals now that she has had her first vaccine for corona virus, and is hopeful that she will be able to resume her outside walking once the weather warms up; this was  encouraged.  Because of her improved pain levels, Bonnie Reed no longer believes that she needs to schedule a visit with her orthopedic doctor.   Client's View of His/Her Situation: Client View Of His/Her Situation: "I am feeling so much better about everything now that we have all received the vaccine for the corona virus.  I am so hopeful that the weather will get better soon, so I can start being outside again- I feel much more safe and secure with the home modifications and the exercises I am doing and equipment I am using.  We are so grateful for the Aging Gracefully program"  Medication Assessment: reports no changes; continues using pill box provided during previous Aging Designer, fashion/clothing Visit   OT Update: Will update aging Gracefully team on successful RN/ final visit and patient's successful accomplishment of meeting her previously established Aging Gracefully goals  Session Summary: Reviewed with client all previous initial and brainstorming strategies around previously identified target areas; [name] appears to have made excellent progress in initiating the strategies we discussed during previous visits, and these were summarized with client today.  Discussed with client that since she has now accomplished her previously established Aging Gracefully goals, that this will be the final Aging Gracefully RN visit, and she is in agreement.  Congratulations and positive reinforcement provided to client.  Oneta Rack, RN, BSN, [employer] [facility] Care Management  [phone]

## 2019-05-19 NOTE — Patient Instructions (Signed)
It has been great meeting you and working with you, Bonnie Reed!!  You have made excellent progress in accomplishing your Aging Gracefully goals-- keep up the great work and continue to review the strategies we discussed during our visits, as needed, for future reference  CLIENT/RN ACTION PLAN - PAIN  Registered Nurse:  Reginia Naas RN Date:May 19, 2019  Client Name: Bonnie Reed Client ID:    Target Area:  PAIN   Why Problem May Occur:  Chronic (R) hip pain/ (R) lower extremity arthritis    Target Goal: "Manage my (R) hip pain so I can stay as independent as possible and do the things I need and want to do"    STRATEGIES Coping Strategies Ideas:  Heat . Use heating pad or warm towel on painful area no more than 20 minutes at a time. . Don't sleep with a heating pad on - it could burn your skin . Warm showers  Ice . Use ice pack or frozen bag of vegetables on painful area.   . Leave cold pack on for less than 20 minutes. Bonnie Reed can burn your skin.  Don't leave ice on longer than 20 minutes. .   Activity and Exercise . Joints get stiff when not in use . Aging Gracefully Exercises . Walking (inside or outside) . Dancing  . Gardening . Housework:  cooking, Education administrator, Medical sales representative . Swimming . Walking dogs when weather is nice . Consider online exercise programs as we discussed  Listen to Music . Listening to music can decrease pain. . Turn off the TV and turn on the radio. .   Prayer/Meditation . Prayer and meditation can decrease pain .   Other .   Acetaminophen/Tylenol (same medication) . DO NOT take more Acetaminophen than below because it can be bad for your liver. o 500 mg. tablets:  2 tablets every 8 hours, as needed for pain.  Do not take more than 6, (500 mg) tablets every day. o 300 mg. tablets:  2 tablets every 6 hours, as needed for pain.  Do not take more than 8 (325 mg) tablets every day. . Look for Acetaminophen/Tylenol in other medicines you buy over the counter. .    Still in Pain . There ae a lot of different kinds of pain medicines:  creams, patches and supplements. . Ask your Healthcare Provider about other pain medications. .   Stop Smoking . Smoking can make arthritis worse. Marland Kitchen   Stop Pain . Before it gets bad. . Once in pain It is harder to get rid of. . Begin pain relief while you have mild pain. .   Other .   Other .    ;  PRACTICE It is important to practice the strategies so we can determine if they will be effective in helping to reach the goal.    Follow these specific recommendations:  -- now that you have start doing the Aging Gracefully exercises we discussed during our first visit, try to gradually increase the time and amount you do each week; always start conservatively and gradually build up; do not do any exercises that cause you pain, and immediately stop if you experience pain while exercising -- consider doing online exercise programs for seniors, as we discussed -- consider starting to walk your dogs again when weather is nice, if you feel comfortable walking outside while following standard precautions for corona virus -- consider making an appointment with an orthopedic specialist to evaluate your pain -- consider re-positioning your  computer desk top monitor to make sure your body is in good alignment, without twisting; try to keep your arms at your side in natural position for typing, as we discussed during our first visit: I am so glad to hear that this strategy was effective!  :) -- call your doctor if there is a change in your usual pain level/ if pain increases -- notify your doctor if you experience any new falls, even if you are not seriously hurt; continue using your cane as needed and proactively take your cane with you, even if think you may need to use it-- so you will have it, in case you do -- try not to rush at any time; take your time and move slowly and deliberately          Bonnie Rack, RN, BSN,  [place] Care Management  [phone]

## 2019-05-23 ENCOUNTER — Other Ambulatory Visit: Payer: Self-pay

## 2019-05-23 NOTE — Patient Outreach (Signed)
[facility] Evaluation Interviewer attempted to call patient on today regarding completing 2nd evaluation.  CMA left confidential voicemail for return call. Will attempt to call back within 1 week.  [place] Management Assistant [phone]

## 2019-06-19 ENCOUNTER — Other Ambulatory Visit: Payer: Self-pay | Admitting: Family Medicine

## 2019-06-19 DIAGNOSIS — Z794 Long term (current) use of insulin: Secondary | ICD-10-CM

## 2019-06-19 DIAGNOSIS — E118 Type 2 diabetes mellitus with unspecified complications: Secondary | ICD-10-CM

## 2019-06-20 ENCOUNTER — Other Ambulatory Visit: Payer: Self-pay | Admitting: Hematology

## 2019-06-20 ENCOUNTER — Other Ambulatory Visit: Payer: Self-pay | Admitting: Family Medicine

## 2019-06-21 ENCOUNTER — Other Ambulatory Visit: Payer: Self-pay

## 2019-06-21 NOTE — Patient Outreach (Signed)
[facility] Evaluation Interviewer made contact with patient and completed second evaluation.   CMA will follow up with patient with third evaluation in 4 months.  Interviewer will send [facility] confirmation email of patients interview.  [place] Management Assistant [phone]

## 2019-07-06 ENCOUNTER — Other Ambulatory Visit: Payer: Self-pay | Admitting: Family Medicine

## 2019-07-06 DIAGNOSIS — Z794 Long term (current) use of insulin: Secondary | ICD-10-CM

## 2019-07-06 DIAGNOSIS — E118 Type 2 diabetes mellitus with unspecified complications: Secondary | ICD-10-CM

## 2019-11-01 ENCOUNTER — Other Ambulatory Visit: Payer: Self-pay

## 2019-11-01 NOTE — Patient Outreach (Signed)
Aging Gracefully Program  11/01/2019  Bonnie Reed [facility] 06/19/1945 [id number]  [facility] Evaluation Interviewer attempted to call patient on today regarding final evaluation. CMA left message for returned call.  Will attempt to call back within 1 week.  [place] Management Assistant [phone]

## 2019-11-07 ENCOUNTER — Other Ambulatory Visit: Payer: Self-pay

## 2019-11-07 NOTE — Patient Outreach (Signed)
Aging Gracefully Program  11/07/2019  Rosaly Labarbera [facility] 11-Jul-1945 [id number]  [facility] Evaluation Interviewer attempted to call patient on today regarding Aging Gracefully referral. No answer from patient after multiple rings.  Will attempt to call back later in the week.   [place] Management Assistant [phone]

## 2019-11-13 ENCOUNTER — Other Ambulatory Visit: Payer: Self-pay

## 2019-11-13 NOTE — Patient Outreach (Signed)
Aging Gracefully Program  11/13/2019  Bonnie Reed [facility] [birth year] [id number]  [facility] Evaluation Interviewer attempted to call patient today regarding final evaluation. CMA left message for returned call.  Will attempt to call back within 1 week.  [facility] Management Assistant [phone]

## 2019-11-20 ENCOUNTER — Other Ambulatory Visit: Payer: Self-pay

## 2019-11-20 NOTE — Patient Outreach (Signed)
Aging Gracefully Program  11/20/2019  Bonnie Reed 08-05-1945 [id number]   [facility] Evaluation Interviewer attempted to call patient on today regarding Aging Gracefully final evaluation phone appointment. Left message to call back. After the call noticed taht patient was actually scheduled for AG phone call at 11:30 am today. I did not have this in my notes, this was not completed at that time.  Will attempt to call back within 1 week.  [facility] Management Assistant

## 2019-11-22 ENCOUNTER — Other Ambulatory Visit: Payer: Self-pay

## 2019-11-22 NOTE — Patient Outreach (Signed)
Aging Gracefully Program  11/22/2019  Audrianna Driskill [facility] 07-19-1945 [id number]   [facility] Evaluation Interviewer made contact with patient. Completed final evaluation #3 (9 months follow up) with patient today.   [facility] Management Assistant

## 2020-04-11 DIAGNOSIS — I10 Essential (primary) hypertension: Secondary | ICD-10-CM | POA: Diagnosis not present

## 2020-04-11 DIAGNOSIS — I2510 Atherosclerotic heart disease of native coronary artery without angina pectoris: Secondary | ICD-10-CM | POA: Diagnosis not present

## 2020-04-11 DIAGNOSIS — E1165 Type 2 diabetes mellitus with hyperglycemia: Secondary | ICD-10-CM | POA: Diagnosis not present

## 2020-04-11 DIAGNOSIS — Z20822 Contact with and (suspected) exposure to covid-19: Secondary | ICD-10-CM | POA: Diagnosis not present

## 2020-04-11 DIAGNOSIS — E782 Mixed hyperlipidemia: Secondary | ICD-10-CM | POA: Diagnosis not present

## 2020-04-11 DIAGNOSIS — R059 Cough, unspecified: Secondary | ICD-10-CM | POA: Diagnosis not present

## 2020-05-06 ENCOUNTER — Other Ambulatory Visit: Payer: Self-pay | Admitting: Family Medicine

## 2020-05-06 DIAGNOSIS — Z794 Long term (current) use of insulin: Secondary | ICD-10-CM

## 2020-05-16 DIAGNOSIS — R350 Frequency of micturition: Secondary | ICD-10-CM | POA: Diagnosis not present

## 2020-05-16 DIAGNOSIS — K5792 Diverticulitis of intestine, part unspecified, without perforation or abscess without bleeding: Secondary | ICD-10-CM | POA: Diagnosis not present

## 2020-05-16 DIAGNOSIS — R110 Nausea: Secondary | ICD-10-CM | POA: Diagnosis not present

## 2020-05-16 DIAGNOSIS — I10 Essential (primary) hypertension: Secondary | ICD-10-CM | POA: Diagnosis not present

## 2020-05-30 DIAGNOSIS — R718 Other abnormality of red blood cells: Secondary | ICD-10-CM | POA: Diagnosis not present

## 2020-05-30 DIAGNOSIS — E1165 Type 2 diabetes mellitus with hyperglycemia: Secondary | ICD-10-CM | POA: Diagnosis not present

## 2020-05-30 DIAGNOSIS — I2510 Atherosclerotic heart disease of native coronary artery without angina pectoris: Secondary | ICD-10-CM | POA: Diagnosis not present

## 2020-05-30 DIAGNOSIS — Z0001 Encounter for general adult medical examination with abnormal findings: Secondary | ICD-10-CM | POA: Diagnosis not present

## 2020-05-30 DIAGNOSIS — E049 Nontoxic goiter, unspecified: Secondary | ICD-10-CM | POA: Diagnosis not present

## 2020-05-30 DIAGNOSIS — I10 Essential (primary) hypertension: Secondary | ICD-10-CM | POA: Diagnosis not present

## 2020-05-30 DIAGNOSIS — E782 Mixed hyperlipidemia: Secondary | ICD-10-CM | POA: Diagnosis not present

## 2020-06-04 DIAGNOSIS — Z823 Family history of stroke: Secondary | ICD-10-CM | POA: Diagnosis not present

## 2020-06-04 DIAGNOSIS — Z8249 Family history of ischemic heart disease and other diseases of the circulatory system: Secondary | ICD-10-CM | POA: Diagnosis not present

## 2020-06-04 DIAGNOSIS — I10 Essential (primary) hypertension: Secondary | ICD-10-CM | POA: Diagnosis not present

## 2020-06-04 DIAGNOSIS — Z6833 Body mass index (BMI) 33.0-33.9, adult: Secondary | ICD-10-CM | POA: Diagnosis not present

## 2020-06-04 DIAGNOSIS — Z7984 Long term (current) use of oral hypoglycemic drugs: Secondary | ICD-10-CM | POA: Diagnosis not present

## 2020-06-04 DIAGNOSIS — Z833 Family history of diabetes mellitus: Secondary | ICD-10-CM | POA: Diagnosis not present

## 2020-06-04 DIAGNOSIS — Z794 Long term (current) use of insulin: Secondary | ICD-10-CM | POA: Diagnosis not present

## 2020-06-04 DIAGNOSIS — E119 Type 2 diabetes mellitus without complications: Secondary | ICD-10-CM | POA: Diagnosis not present

## 2020-06-04 DIAGNOSIS — E669 Obesity, unspecified: Secondary | ICD-10-CM | POA: Diagnosis not present

## 2020-06-04 DIAGNOSIS — Z7982 Long term (current) use of aspirin: Secondary | ICD-10-CM | POA: Diagnosis not present

## 2020-06-30 DIAGNOSIS — J019 Acute sinusitis, unspecified: Secondary | ICD-10-CM | POA: Diagnosis not present

## 2020-07-01 DIAGNOSIS — H5713 Ocular pain, bilateral: Secondary | ICD-10-CM | POA: Diagnosis not present

## 2020-07-01 DIAGNOSIS — H524 Presbyopia: Secondary | ICD-10-CM | POA: Diagnosis not present

## 2020-07-19 DIAGNOSIS — I10 Essential (primary) hypertension: Secondary | ICD-10-CM | POA: Diagnosis not present

## 2020-07-19 DIAGNOSIS — Z0001 Encounter for general adult medical examination with abnormal findings: Secondary | ICD-10-CM | POA: Diagnosis not present

## 2020-07-19 DIAGNOSIS — E1165 Type 2 diabetes mellitus with hyperglycemia: Secondary | ICD-10-CM | POA: Diagnosis not present

## 2020-07-19 DIAGNOSIS — R718 Other abnormality of red blood cells: Secondary | ICD-10-CM | POA: Diagnosis not present

## 2020-07-19 DIAGNOSIS — E782 Mixed hyperlipidemia: Secondary | ICD-10-CM | POA: Diagnosis not present

## 2020-07-19 DIAGNOSIS — I2510 Atherosclerotic heart disease of native coronary artery without angina pectoris: Secondary | ICD-10-CM | POA: Diagnosis not present

## 2020-07-19 DIAGNOSIS — E049 Nontoxic goiter, unspecified: Secondary | ICD-10-CM | POA: Diagnosis not present

## 2020-07-23 ENCOUNTER — Other Ambulatory Visit: Payer: Self-pay | Admitting: Family Medicine

## 2020-07-23 DIAGNOSIS — Z794 Long term (current) use of insulin: Secondary | ICD-10-CM

## 2020-07-23 DIAGNOSIS — E118 Type 2 diabetes mellitus with unspecified complications: Secondary | ICD-10-CM

## 2020-07-29 DIAGNOSIS — H10413 Chronic giant papillary conjunctivitis, bilateral: Secondary | ICD-10-CM | POA: Diagnosis not present

## 2020-07-29 DIAGNOSIS — H04123 Dry eye syndrome of bilateral lacrimal glands: Secondary | ICD-10-CM | POA: Diagnosis not present

## 2020-08-26 DIAGNOSIS — I2510 Atherosclerotic heart disease of native coronary artery without angina pectoris: Secondary | ICD-10-CM | POA: Diagnosis not present

## 2020-08-26 DIAGNOSIS — I10 Essential (primary) hypertension: Secondary | ICD-10-CM | POA: Diagnosis not present

## 2020-08-26 DIAGNOSIS — E049 Nontoxic goiter, unspecified: Secondary | ICD-10-CM | POA: Diagnosis not present

## 2020-08-26 DIAGNOSIS — R718 Other abnormality of red blood cells: Secondary | ICD-10-CM | POA: Diagnosis not present

## 2020-08-26 DIAGNOSIS — E1165 Type 2 diabetes mellitus with hyperglycemia: Secondary | ICD-10-CM | POA: Diagnosis not present

## 2020-08-26 DIAGNOSIS — E782 Mixed hyperlipidemia: Secondary | ICD-10-CM | POA: Diagnosis not present

## 2020-08-26 DIAGNOSIS — Q829 Congenital malformation of skin, unspecified: Secondary | ICD-10-CM | POA: Diagnosis not present

## 2020-09-03 DIAGNOSIS — N644 Mastodynia: Secondary | ICD-10-CM | POA: Diagnosis not present

## 2020-09-03 DIAGNOSIS — Z853 Personal history of malignant neoplasm of breast: Secondary | ICD-10-CM | POA: Diagnosis not present

## 2020-09-03 DIAGNOSIS — N6081 Other benign mammary dysplasias of right breast: Secondary | ICD-10-CM | POA: Diagnosis not present

## 2020-09-11 DIAGNOSIS — R718 Other abnormality of red blood cells: Secondary | ICD-10-CM | POA: Diagnosis not present

## 2020-09-11 DIAGNOSIS — E049 Nontoxic goiter, unspecified: Secondary | ICD-10-CM | POA: Diagnosis not present

## 2020-09-11 DIAGNOSIS — M5137 Other intervertebral disc degeneration, lumbosacral region: Secondary | ICD-10-CM | POA: Diagnosis not present

## 2020-09-11 DIAGNOSIS — B373 Candidiasis of vulva and vagina: Secondary | ICD-10-CM | POA: Diagnosis not present

## 2020-09-11 DIAGNOSIS — R1013 Epigastric pain: Secondary | ICD-10-CM | POA: Diagnosis not present

## 2020-09-11 DIAGNOSIS — I2510 Atherosclerotic heart disease of native coronary artery without angina pectoris: Secondary | ICD-10-CM | POA: Diagnosis not present

## 2020-09-11 DIAGNOSIS — E1165 Type 2 diabetes mellitus with hyperglycemia: Secondary | ICD-10-CM | POA: Diagnosis not present

## 2020-09-11 DIAGNOSIS — E782 Mixed hyperlipidemia: Secondary | ICD-10-CM | POA: Diagnosis not present

## 2020-09-11 DIAGNOSIS — I10 Essential (primary) hypertension: Secondary | ICD-10-CM | POA: Diagnosis not present

## 2020-11-13 DIAGNOSIS — M5137 Other intervertebral disc degeneration, lumbosacral region: Secondary | ICD-10-CM | POA: Diagnosis not present

## 2020-11-13 DIAGNOSIS — E049 Nontoxic goiter, unspecified: Secondary | ICD-10-CM | POA: Diagnosis not present

## 2020-11-13 DIAGNOSIS — E782 Mixed hyperlipidemia: Secondary | ICD-10-CM | POA: Diagnosis not present

## 2020-11-13 DIAGNOSIS — K5904 Chronic idiopathic constipation: Secondary | ICD-10-CM | POA: Diagnosis not present

## 2020-11-13 DIAGNOSIS — I10 Essential (primary) hypertension: Secondary | ICD-10-CM | POA: Diagnosis not present

## 2020-11-13 DIAGNOSIS — E1165 Type 2 diabetes mellitus with hyperglycemia: Secondary | ICD-10-CM | POA: Diagnosis not present

## 2020-11-13 DIAGNOSIS — I2510 Atherosclerotic heart disease of native coronary artery without angina pectoris: Secondary | ICD-10-CM | POA: Diagnosis not present

## 2020-11-30 DIAGNOSIS — J019 Acute sinusitis, unspecified: Secondary | ICD-10-CM | POA: Diagnosis not present

## 2020-11-30 DIAGNOSIS — Z20828 Contact with and (suspected) exposure to other viral communicable diseases: Secondary | ICD-10-CM | POA: Diagnosis not present

## 2020-12-05 ENCOUNTER — Other Ambulatory Visit: Payer: Self-pay | Admitting: Family Medicine

## 2020-12-05 DIAGNOSIS — E118 Type 2 diabetes mellitus with unspecified complications: Secondary | ICD-10-CM

## 2020-12-05 DIAGNOSIS — Z794 Long term (current) use of insulin: Secondary | ICD-10-CM

## 2020-12-18 DIAGNOSIS — I2510 Atherosclerotic heart disease of native coronary artery without angina pectoris: Secondary | ICD-10-CM | POA: Diagnosis not present

## 2020-12-18 DIAGNOSIS — E1165 Type 2 diabetes mellitus with hyperglycemia: Secondary | ICD-10-CM | POA: Diagnosis not present

## 2020-12-18 DIAGNOSIS — I10 Essential (primary) hypertension: Secondary | ICD-10-CM | POA: Diagnosis not present

## 2020-12-18 DIAGNOSIS — E049 Nontoxic goiter, unspecified: Secondary | ICD-10-CM | POA: Diagnosis not present

## 2020-12-18 DIAGNOSIS — E782 Mixed hyperlipidemia: Secondary | ICD-10-CM | POA: Diagnosis not present

## 2020-12-18 DIAGNOSIS — K5904 Chronic idiopathic constipation: Secondary | ICD-10-CM | POA: Diagnosis not present

## 2020-12-18 DIAGNOSIS — M5137 Other intervertebral disc degeneration, lumbosacral region: Secondary | ICD-10-CM | POA: Diagnosis not present

## 2020-12-26 ENCOUNTER — Encounter: Payer: Self-pay | Admitting: Gastroenterology

## 2021-01-09 ENCOUNTER — Other Ambulatory Visit (INDEPENDENT_AMBULATORY_CARE_PROVIDER_SITE_OTHER): Payer: Medicare HMO

## 2021-01-09 ENCOUNTER — Ambulatory Visit (INDEPENDENT_AMBULATORY_CARE_PROVIDER_SITE_OTHER): Payer: Medicare HMO | Admitting: Gastroenterology

## 2021-01-09 ENCOUNTER — Other Ambulatory Visit: Payer: Self-pay

## 2021-01-09 VITALS — BP 130/82 | HR 80 | Ht 61.0 in | Wt 176.4 lb

## 2021-01-09 DIAGNOSIS — K5730 Diverticulosis of large intestine without perforation or abscess without bleeding: Secondary | ICD-10-CM | POA: Diagnosis not present

## 2021-01-09 DIAGNOSIS — R1032 Left lower quadrant pain: Secondary | ICD-10-CM

## 2021-01-09 DIAGNOSIS — Z860100 Personal history of colon polyps, unspecified: Secondary | ICD-10-CM

## 2021-01-09 DIAGNOSIS — K5900 Constipation, unspecified: Secondary | ICD-10-CM | POA: Diagnosis not present

## 2021-01-09 LAB — CBC
HCT: 40.9 % (ref 36.0–46.0)
Hemoglobin: 12.6 g/dL (ref 12.0–15.0)
MCHC: 30.7 g/dL (ref 30.0–36.0)
MCV: 72.3 fl — ABNORMAL LOW (ref 78.0–100.0)
Platelets: 265 10*3/uL (ref 150.0–400.0)
RBC: 5.66 Mil/uL — ABNORMAL HIGH (ref 3.87–5.11)
RDW: 15.9 % — ABNORMAL HIGH (ref 11.5–15.5)
WBC: 7.5 10*3/uL (ref 4.0–10.5)

## 2021-01-09 LAB — BASIC METABOLIC PANEL
BUN: 15 mg/dL (ref 6–23)
CO2: 23 mEq/L (ref 19–32)
Calcium: 9.7 mg/dL (ref 8.4–10.5)
Chloride: 106 mEq/L (ref 96–112)
Creatinine, Ser: 0.93 mg/dL (ref 0.40–1.20)
GFR: 60.52 mL/min (ref >60.00)
Glucose, Bld: 202 mg/dL — ABNORMAL HIGH (ref 70–99)
Potassium: 4.2 mEq/L (ref 3.5–5.1)
Sodium: 139 mEq/L (ref 135–145)

## 2021-01-09 MED ORDER — CLENPIQ 10-3.5-12 MG-GM -GM/160ML PO SOLN: 1.0000 | ORAL | 0 refills | Status: DC

## 2021-01-09 NOTE — Progress Notes (Signed)
Chief Complaint: Colon cancer screening, abdominal pain   Referring Provider:     Gerlene Fee, DO   HPI:     Bonnie Reed is a 75 y.o. female with a history of HTN, CAD (ASA 81 mg),  diabetes, EF 50% in 2013, hyperlipidemia, depression, history of breast cancer  s/p lumpectomy and radiation, GERD, diverticulosis, osteoarthritis, allergic rhinitis, DDD, cholecystectomy, referred to the Gastroenterology Clinic to discuss colonoscopy for ongoing colon cancer screening along with evaluation of LLQ pain.  She reports her last colonoscopy was 10+ years ago.  No results available for review, but she reports a history of colon polyps on each of her previous 3-4 colonoscopes (unsure of size, number, location, histology). All previous colonoscopies were done in [place], [place]. No issues with sedation or tolerating prep in past.   Separately, she has been having intermittent LLQ pain for the last 2 weeks or so.  This is in the setting of constipation. No associated hematochezia, melena.  No nausea/vomiting. Started taking Linzess about 1 week ago with resolution of constipation and improvement in pain.  Does not have chronic constipation.    Does have a hx of diverticulosis with episode of diverticulitis 12-13 years ago, treated with Abx. States this pain is different (less severe).   Was last seen by her PCM on 09/11/2020.  Did endorse epigastric pain at that time and was started on Protonix 40 mg/day and referred to the GI clinic today to discuss colonoscopy for ongoing CRC screening.  No recent labs or abdominal imaging for review.  No known family history of CRC, GI malignancy, liver disease, pancreatic disease, or IBD.    Past Medical History:  Diagnosis Date   Allergy    Blood transfusion without reported diagnosis    Breast cancer ([place]) 1997   Left Breast Cancer   Cancer ([place])    CHF (congestive heart failure) ([place])    Diabetes mellitus without complication  ([place])    Hypertension    Personal history of radiation therapy 1997   Left Breast Cancer     Past Surgical History:  Procedure Laterality Date   APPENDECTOMY     BREAST LUMPECTOMY Left 1997   CESAREAN SECTION     CHOLECYSTECTOMY     REDUCTION MAMMAPLASTY Right 1998   TOTAL HIP ARTHROPLASTY Right    TUBAL LIGATION     Family History  Problem Relation Age of Onset   Stroke Mother    Kidney disease Mother    Cancer Father    Colon cancer Neg Hx    Rectal cancer Neg Hx    Social History   Tobacco Use   Smoking status: Never   Smokeless tobacco: Never  Vaping Use   Vaping Use: Never used  Substance Use Topics   Alcohol use: No   Drug use: No   Current Outpatient Medications  Medication Sig Dispense Refill   ACCU-CHEK SOFTCLIX LANCETS lancets Use to check blood sugar three times daily or as directed. 100 each 12   acetaminophen (TYLENOL) 500 MG tablet Take 1,000 mg by mouth daily as needed for mild pain.     aspirin EC 81 MG tablet Take 81 mg by mouth daily.     atorvastatin (LIPITOR) 40 MG tablet Take 1 tablet (40 mg total) by mouth daily. 90 tablet 3   diphenhydrAMINE (BENADRYL) 25 mg capsule Take 25 mg by mouth daily as needed for allergies.  glipiZIDE (GLUCOTROL) 10 MG tablet Take 10 mg by mouth 2 (two) times daily.     glucose blood (ONETOUCH VERIO) test strip Use to test 3 times a day Dx:E11.9 100 each 12   hydrochlorothiazide (HYDRODIURIL) 25 MG tablet Take 25 mg by mouth daily.     JARDIANCE 10 MG TABS tablet Take 1 tablet by mouth once daily 90 tablet 0   Lancet Devices (ONE TOUCH DELICA LANCING DEV) MISC Use to test 3 times a day Dx:E11.9 1 each 0   LEVEMIR FLEXTOUCH 100 UNIT/ML FlexTouch Pen Inject into the skin. Take 60 units at bed time     NOVOLOG FLEXPEN 100 UNIT/ML FlexPen Inject into the skin. Take 15 units three times daily     sodium chloride (OCEAN) 0.65 % nasal spray Place 1 spray into the nose daily as needed for congestion.     valsartan (DIOVAN)  160 MG tablet Take 1 tablet by mouth once daily 90 tablet 0   fexofenadine (ALLEGRA) 180 MG tablet Take 180 mg by mouth.     No current facility-administered medications for this visit.   Allergies  Allergen Reactions   Chlorpheniramine Itching   Other     Hay Fever and Seasonal allergies    Phenylpropanol     Unknown/cannot remember     Review of Systems: All systems reviewed and negative except where noted in HPI.     Physical Exam:    Wt Readings from Last 3 Encounters:  01/09/21 176 lb 6 oz (80 kg)  02/08/19 175 lb (79.4 kg)  05/03/18 168 lb (76.2 kg)    BP 130/82   Pulse 80   Ht 5\' 1"  (1.549 m)   Wt 176 lb 6 oz (80 kg)   LMP 10/17/1991 (Exact Date)   SpO2 98%   BMI 33.33 kg/m  Constitutional:  Pleasant, in no acute distress. Psychiatric: Normal mood and affect. Behavior is normal. EENT: Pupils normal.  Conjunctivae are normal. No scleral icterus. Neck supple. No cervical LAD. Cardiovascular: Normal rate, regular rhythm. No edema Pulmonary/chest: Effort normal and breath sounds normal. No wheezing, rales or rhonchi. Abdominal: Soft, nondistended, nontender. Bowel sounds active throughout. There are no masses palpable. No hepatomegaly. Neurological: Alert and oriented to person place and time. Skin: Skin is warm and dry. No rashes noted.   ASSESSMENT AND PLAN;   1) History of colon polyps - Schedule colonoscopy for ongoing polyp surveillance - Obtain previous GI records  2) LLQ pain 3) Constipation - Recent onset constipation and LLQ pain.  Constipation resolved with Linzess and subsequent improvement in pain - Evaluate for mucosal/luminal pathology at time of colonoscopy as above - Maintain adequate hydration - Can hopefully taper back off Linzess in near term as she does not have chronic constipation - Check CBC, BMP  4) History of diverticulosis with reported history of diverticulitis - Reports episode of diverticulitis 12-13 years ago.  No  recurrence since then - Evaluate for burden of diverticulosis along with active or chronic inflammatory changes at time of colonoscopy as above  The indications, risks, and benefits of colonoscopy were explained to the patient in detail. Risks include but are not limited to bleeding, perforation, adverse reaction to medications, and cardiopulmonary compromise. Sequelae include but are not limited to the possibility of surgery, hospitalization, and mortality. The patient verbalized understanding and wished to proceed. All questions answered, referred to the scheduler and bowel prep ordered. Further recommendations pending results of the exam.    Lavena Bullion,  DO, FACG  01/09/2021, 8:28 AM   Autry-Lott, Naaman Plummer, DO

## 2021-01-09 NOTE — Patient Instructions (Signed)
If you are age 74 or older, your body mass index should be between 23-30. Your Body mass index is 33.33 kg/m. If this is out of the aforementioned range listed, please consider follow up with your Primary Care Provider.  If you are age 77 or younger, your body mass index should be between 19-25. Your Body mass index is 33.33 kg/m. If this is out of the aformentioned range listed, please consider follow up with your Primary Care Provider.   __________________________________________________________  The [place] providers would like to encourage you to use [facility] to communicate with providers for non-urgent requests or questions.  Due to long hold times on the telephone, sending your provider a message by [facility] may be a faster and more efficient way to get a response.  Please allow 48 business hours for a response.  Please remember that this is for non-urgent requests.   Please go to the lab on the 2nd floor [address] before you leave the office today.   Due to recent changes in healthcare laws, you may see the results of your imaging and laboratory studies on MyChart before your provider has had a chance to review them.  We understand that in some cases there may be results that are confusing or concerning to you. Not all laboratory results come back in the same time frame and the provider may be waiting for multiple results in order to interpret others.  Please give [place] 48 hours in order for your provider to thoroughly review all the results before contacting the office for clarification of your results.

## 2021-01-15 DIAGNOSIS — Z2821 Immunization not carried out because of patient refusal: Secondary | ICD-10-CM | POA: Diagnosis not present

## 2021-01-15 DIAGNOSIS — E782 Mixed hyperlipidemia: Secondary | ICD-10-CM | POA: Diagnosis not present

## 2021-01-15 DIAGNOSIS — I10 Essential (primary) hypertension: Secondary | ICD-10-CM | POA: Diagnosis not present

## 2021-01-15 DIAGNOSIS — K5904 Chronic idiopathic constipation: Secondary | ICD-10-CM | POA: Diagnosis not present

## 2021-01-15 DIAGNOSIS — I2510 Atherosclerotic heart disease of native coronary artery without angina pectoris: Secondary | ICD-10-CM | POA: Diagnosis not present

## 2021-01-15 DIAGNOSIS — E1165 Type 2 diabetes mellitus with hyperglycemia: Secondary | ICD-10-CM | POA: Diagnosis not present

## 2021-01-15 DIAGNOSIS — M5137 Other intervertebral disc degeneration, lumbosacral region: Secondary | ICD-10-CM | POA: Diagnosis not present

## 2021-01-15 DIAGNOSIS — E049 Nontoxic goiter, unspecified: Secondary | ICD-10-CM | POA: Diagnosis not present

## 2021-01-28 ENCOUNTER — Telehealth: Payer: Self-pay | Admitting: General Surgery

## 2021-01-28 DIAGNOSIS — K5730 Diverticulosis of large intestine without perforation or abscess without bleeding: Secondary | ICD-10-CM

## 2021-01-28 DIAGNOSIS — K625 Hemorrhage of anus and rectum: Secondary | ICD-10-CM

## 2021-01-28 NOTE — Telephone Encounter (Signed)
Left a voicemail for the patient to call back regarding her labs.

## 2021-01-28 NOTE — Telephone Encounter (Signed)
-----   Message from [place], DO sent at 01/17/2021  1:51 PM EDT ----- Blood glucose elevated 202.  Unclear if this was a fasting lab.  Otherwise normal BMP.  Normal WBC, platelets.  Normal hemoglobin hematocrit, but reduced MCV (72) and very slightly elevated RDW at 15.9.  This is overall stable (if not somewhat improved) from 3 and 4 years ago comparison labs.  While this may be chronic due to thalassemia trait or other benign condition, it is worth checking iron panel to ensure no iron deficiency.

## 2021-01-30 ENCOUNTER — Telehealth: Payer: Self-pay | Admitting: General Surgery

## 2021-01-30 NOTE — Telephone Encounter (Signed)
-----   Message from [place], DO sent at 01/17/2021  1:51 PM EDT ----- Blood glucose elevated 202.  Unclear if this was a fasting lab.  Otherwise normal BMP.  Normal WBC, platelets.  Normal hemoglobin hematocrit, but reduced MCV (72) and very slightly elevated RDW at 15.9.  This is overall stable (if not somewhat improved) from 3 and 4 years ago comparison labs.  While this may be chronic due to thalassemia trait or other benign condition, it is worth checking iron panel to ensure no iron deficiency.

## 2021-01-30 NOTE — Telephone Encounter (Signed)
The patient contacted the office and I went over her labs with her. Patient will come to [facility] primary care this week to have an iron panel drawn. The patient verbalized understanding

## 2021-01-30 NOTE — Telephone Encounter (Signed)
Left voicemail that this is my second message and I need to go over tests with her.

## 2021-01-30 NOTE — Telephone Encounter (Signed)
-----   Message from [place], DO sent at 01/17/2021  1:51 PM EDT ----- Blood glucose elevated 202.  Unclear if this was a fasting lab.  Otherwise normal BMP.  Normal WBC, platelets.  Normal hemoglobin hematocrit, but reduced MCV (72) and very slightly elevated RDW at 15.9.  This is overall stable (if not somewhat improved) from 3 and 4 years ago comparison labs.  While this may be chronic due to thalassemia trait or other benign condition, it is worth checking iron panel to ensure no iron deficiency.

## 2021-01-31 ENCOUNTER — Other Ambulatory Visit: Payer: Self-pay

## 2021-01-31 ENCOUNTER — Other Ambulatory Visit (INDEPENDENT_AMBULATORY_CARE_PROVIDER_SITE_OTHER): Payer: Medicare HMO

## 2021-01-31 DIAGNOSIS — K625 Hemorrhage of anus and rectum: Secondary | ICD-10-CM

## 2021-01-31 DIAGNOSIS — K5730 Diverticulosis of large intestine without perforation or abscess without bleeding: Secondary | ICD-10-CM

## 2021-01-31 LAB — FERRITIN: Ferritin: 142 ng/mL (ref 10.0–291.0)

## 2021-02-01 LAB — IRON, TOTAL/TOTAL IRON BINDING CAP
%SAT: 29 % (calc) (ref 16–45)
Iron: 115 ug/dL (ref 45–160)
TIBC: 396 mcg/dL (calc) (ref 250–450)

## 2021-02-03 ENCOUNTER — Telehealth: Payer: Self-pay | Admitting: General Surgery

## 2021-02-03 DIAGNOSIS — D563 Thalassemia minor: Secondary | ICD-10-CM

## 2021-02-03 NOTE — Telephone Encounter (Signed)
Left a detailed voicemail for the patient. All labs normal. Placed a referral to Hematology for possible thalassemia trait. Patient encouraged to call with any questions.

## 2021-02-03 NOTE — Telephone Encounter (Signed)
-----   Message from [place], DO sent at 02/01/2021  4:59 PM EST ----- Iron panel looks good.  Ferritin normal at 142 along with normal iron, TIBC, and iron saturation.  Can refer to Hematology for evaluation of possible thalassemia trait.

## 2021-02-12 ENCOUNTER — Ambulatory Visit (AMBULATORY_SURGERY_CENTER): Payer: Medicare HMO | Admitting: Gastroenterology

## 2021-02-12 ENCOUNTER — Other Ambulatory Visit: Payer: Self-pay | Admitting: Gastroenterology

## 2021-02-12 ENCOUNTER — Encounter: Payer: Self-pay | Admitting: Gastroenterology

## 2021-02-12 VITALS — BP 129/52 | HR 67 | Resp 15

## 2021-02-12 DIAGNOSIS — R1012 Left upper quadrant pain: Secondary | ICD-10-CM | POA: Diagnosis not present

## 2021-02-12 DIAGNOSIS — D125 Benign neoplasm of sigmoid colon: Secondary | ICD-10-CM

## 2021-02-12 DIAGNOSIS — K625 Hemorrhage of anus and rectum: Secondary | ICD-10-CM | POA: Diagnosis not present

## 2021-02-12 DIAGNOSIS — K5730 Diverticulosis of large intestine without perforation or abscess without bleeding: Secondary | ICD-10-CM | POA: Diagnosis not present

## 2021-02-12 DIAGNOSIS — R1013 Epigastric pain: Secondary | ICD-10-CM | POA: Diagnosis not present

## 2021-02-12 DIAGNOSIS — K5140 Inflammatory polyps of colon without complications: Secondary | ICD-10-CM

## 2021-02-12 DIAGNOSIS — K2970 Gastritis, unspecified, without bleeding: Secondary | ICD-10-CM

## 2021-02-12 DIAGNOSIS — K5900 Constipation, unspecified: Secondary | ICD-10-CM

## 2021-02-12 DIAGNOSIS — K640 First degree hemorrhoids: Secondary | ICD-10-CM | POA: Diagnosis not present

## 2021-02-12 DIAGNOSIS — D120 Benign neoplasm of cecum: Secondary | ICD-10-CM

## 2021-02-12 DIAGNOSIS — D123 Benign neoplasm of transverse colon: Secondary | ICD-10-CM

## 2021-02-12 DIAGNOSIS — D124 Benign neoplasm of descending colon: Secondary | ICD-10-CM

## 2021-02-12 DIAGNOSIS — D128 Benign neoplasm of rectum: Secondary | ICD-10-CM | POA: Diagnosis not present

## 2021-02-12 DIAGNOSIS — D127 Benign neoplasm of rectosigmoid junction: Secondary | ICD-10-CM | POA: Diagnosis not present

## 2021-02-12 DIAGNOSIS — R1032 Left lower quadrant pain: Secondary | ICD-10-CM | POA: Diagnosis not present

## 2021-02-12 DIAGNOSIS — Z86010 Personal history of colonic polyps: Secondary | ICD-10-CM

## 2021-02-12 DIAGNOSIS — D129 Benign neoplasm of anus and anal canal: Secondary | ICD-10-CM

## 2021-02-12 MED ORDER — SODIUM CHLORIDE 0.9 % IV SOLN: 500.0000 mL | INTRAVENOUS | Status: DC

## 2021-02-12 NOTE — Progress Notes (Signed)
Called to room to assist during endoscopic procedure.  Patient ID and intended procedure confirmed with present staff. Received instructions for my participation in the procedure from the performing physician.  

## 2021-02-12 NOTE — Progress Notes (Signed)
Pt's states no medical or surgical changes since previsit or office visit. 

## 2021-02-12 NOTE — Patient Instructions (Signed)
Handouts Provided:  Polyps and Diverticulosis  YOU HAD AN ENDOSCOPIC PROCEDURE TODAY AT [facility]:   Refer to the procedure report that was given to you for any specific questions about what was found during the examination.  If the procedure report does not answer your questions, please call your gastroenterologist to clarify.  If you requested that your care partner not be given the details of your procedure findings, then the procedure report has been included in a sealed envelope for you to review at your convenience later.  YOU SHOULD EXPECT: Some feelings of bloating in the abdomen. Passage of more gas than usual.  Walking can help get rid of the air that was put into your GI tract during the procedure and reduce the bloating. If you had a lower endoscopy (such as a colonoscopy or flexible sigmoidoscopy) you may notice spotting of blood in your stool or on the toilet paper. If you underwent a bowel prep for your procedure, you may not have a normal bowel movement for a few days.  Please Note:  You might notice some irritation and congestion in your nose or some drainage.  This is from the oxygen used during your procedure.  There is no need for concern and it should clear up in a day or so.  SYMPTOMS TO REPORT IMMEDIATELY:  Following lower endoscopy (colonoscopy or flexible sigmoidoscopy):  Excessive amounts of blood in the stool  Significant tenderness or worsening of abdominal pains  Swelling of the abdomen that is new, acute  Fever of 100F or higher  Following upper endoscopy (EGD)  Vomiting of blood or coffee ground material  New chest pain or pain under the shoulder blades  Painful or persistently difficult swallowing  New shortness of breath  Fever of 100F or higher  Black, tarry-looking stools  For urgent or emergent issues, a gastroenterologist can be reached at any hour by calling [phone]. Do not use MyChart messaging for urgent concerns.     DIET:  We do recommend a small meal at first, but then you may proceed to your regular diet.  Drink plenty of fluids but you should avoid alcoholic beverages for 24 hours.  ACTIVITY:  You should plan to take it easy for the rest of today and you should NOT DRIVE or use heavy machinery until tomorrow (because of the sedation medicines used during the test).    FOLLOW UP: Our staff will call the number listed on your records 48-72 hours following your procedure to check on you and address any questions or concerns that you may have regarding the information given to you following your procedure. If we do not reach you, we will leave a message.  We will attempt to reach you two times.  During this call, we will ask if you have developed any symptoms of COVID 19. If you develop any symptoms (ie: fever, flu-like symptoms, shortness of breath, cough etc.) before then, please call [phone].  If you test positive for Covid 19 in the 2 weeks post procedure, please call and report this information to [place].    If any biopsies were taken you will be contacted by phone or by letter within the next 1-3 weeks.  Please call us at [phone] if you have not heard about the biopsies in 3 weeks.    SIGNATURES/CONFIDENTIALITY: You and/or your care partner have signed paperwork which will be entered into your electronic medical record.  These signatures attest to the fact that  that the information above on your After Visit Summary has been reviewed and is understood.  Full responsibility of the confidentiality of this discharge information lies with you and/or your care-partner.

## 2021-02-12 NOTE — Progress Notes (Signed)
GASTROENTEROLOGY PROCEDURE H&P NOTE   Primary Care Physician: Gerlene Fee, DO    Reason for Procedure:  Colon cancer screening/polyp surveillance, LLQ pain, history of diverticulosis with diverticulitis diverticulosis with history of diverticulitis, GERD, epigastric pain  Plan:    Colonoscopy, EGD  Patient is appropriate for endoscopic procedure(s) in the ambulatory ([place] [facility]) setting.  The nature of the procedure, as well as the risks, benefits, and alternatives were carefully and thoroughly reviewed with the patient. Ample time for discussion and questions allowed. The patient understood, was satisfied, and agreed to proceed.     HPI: Bonnie Reed is a 75 y.o. female who presents for colonoscopy for ongoing polyp surveillance along with evaluation of LLQ pain in the setting of known diverticulosis with history of diverticulitis 12-13 years ago.  Additionally, history of GERD and presents for EGD for BE screening along with evaluation of epigastric pain.  She reports her last colonoscopy was 10+ years ago.  No results available for review, but she reports a history of colon polyps on each of her previous 3-4 colonoscopes (unsure of size, number, location, histology). All previous colonoscopies were done in [place], [place].  Past Medical History:  Diagnosis Date   Allergy    Blood transfusion without reported diagnosis    Breast cancer ([facility]) 1997   Left Breast Cancer   Cancer ([facility])    CHF (congestive heart failure) ([facility])    Diabetes mellitus without complication ([facility])    Hypertension    Personal history of radiation therapy 1997   Left Breast Cancer    Past Surgical History:  Procedure Laterality Date   APPENDECTOMY     BREAST LUMPECTOMY Left 1997   CESAREAN SECTION     CHOLECYSTECTOMY     REDUCTION MAMMAPLASTY Right 1998   TOTAL HIP ARTHROPLASTY Right    TUBAL LIGATION      Prior to Admission medications   Medication Sig Start Date End Date Taking?  Authorizing Provider  ACCU-CHEK SOFTCLIX LANCETS lancets Use to check blood sugar three times daily or as directed. 05/26/18   Martyn Malay, MD  acetaminophen (TYLENOL) 500 MG tablet Take 1,000 mg by mouth daily as needed for mild pain.    [provider]  aspirin EC 81 MG tablet Take 81 mg by mouth daily.    [provider]  atorvastatin (LIPITOR) 40 MG tablet Take 1 tablet (40 mg total) by mouth daily. 04/21/17   Everrett Coombe, MD  diphenhydrAMINE (BENADRYL) 25 mg capsule Take 25 mg by mouth daily as needed for allergies.    [provider]  fexofenadine (ALLEGRA) 180 MG tablet Take 180 mg by mouth. 09/08/16 02/08/19  [provider]  glipiZIDE (GLUCOTROL) 10 MG tablet Take 10 mg by mouth 2 (two) times daily. 01/01/21   [provider]  glucose blood (ONETOUCH VERIO) test strip Use to test 3 times a day Dx:E11.9 02/08/19   Dickie La, MD  hydrochlorothiazide (HYDRODIURIL) 25 MG tablet Take 25 mg by mouth daily. 10/15/20   [provider]  JARDIANCE 10 MG TABS tablet Take 1 tablet by mouth once daily 12/05/20   Autry-Lott, Naaman Plummer, DO  Lancet Devices (ONE TOUCH DELICA LANCING DEV) MISC Use to test 3 times a day Dx:E11.9 03/29/18   Dickie La, MD  LEVEMIR FLEXTOUCH 100 UNIT/ML FlexTouch Pen Inject into the skin. Take 60 units at bed time 09/12/20   [provider]  NOVOLOG FLEXPEN 100 UNIT/ML FlexPen Inject into the skin. Take  15 units three times daily 01/07/21   [provider]  Sod Picosulfate-Mag Ox-Cit Acd (CLENPIQ) 10-3.5-12 MG-GM -GM/160ML SOLN Take 1 kit by mouth as directed. 01/09/21   Shevonne Wolf V, DO  sodium chloride (OCEAN) 0.65 % nasal spray Place 1 spray into the nose daily as needed for congestion.    [provider]  valsartan (DIOVAN) 160 MG tablet Take 1 tablet by mouth once daily 01/19/19   Autry-Lott, Naaman Plummer, DO    Current Outpatient Medications  Medication Sig Dispense Refill   ACCU-CHEK  SOFTCLIX LANCETS lancets Use to check blood sugar three times daily or as directed. 100 each 12   acetaminophen (TYLENOL) 500 MG tablet Take 1,000 mg by mouth daily as needed for mild pain.     aspirin EC 81 MG tablet Take 81 mg by mouth daily.     atorvastatin (LIPITOR) 40 MG tablet Take 1 tablet (40 mg total) by mouth daily. 90 tablet 3   diphenhydrAMINE (BENADRYL) 25 mg capsule Take 25 mg by mouth daily as needed for allergies.     fexofenadine (ALLEGRA) 180 MG tablet Take 180 mg by mouth.     glipiZIDE (GLUCOTROL) 10 MG tablet Take 10 mg by mouth 2 (two) times daily.     glucose blood (ONETOUCH VERIO) test strip Use to test 3 times a day Dx:E11.9 100 each 12   hydrochlorothiazide (HYDRODIURIL) 25 MG tablet Take 25 mg by mouth daily.     JARDIANCE 10 MG TABS tablet Take 1 tablet by mouth once daily 90 tablet 0   Lancet Devices (ONE TOUCH DELICA LANCING DEV) MISC Use to test 3 times a day Dx:E11.9 1 each 0   LEVEMIR FLEXTOUCH 100 UNIT/ML FlexTouch Pen Inject into the skin. Take 60 units at bed time     NOVOLOG FLEXPEN 100 UNIT/ML FlexPen Inject into the skin. Take 15 units three times daily     Sod Picosulfate-Mag Ox-Cit Acd (CLENPIQ) 10-3.5-12 MG-GM -GM/160ML SOLN Take 1 kit by mouth as directed. 320 mL 0   sodium chloride (OCEAN) 0.65 % nasal spray Place 1 spray into the nose daily as needed for congestion.     valsartan (DIOVAN) 160 MG tablet Take 1 tablet by mouth once daily 90 tablet 0   Current Facility-Administered Medications  Medication Dose Route Frequency Provider Last Rate Last Admin   0.9 %  sodium chloride infusion  500 mL Intravenous Continuous Johnson Arizola V, DO        Allergies as of 02/12/2021 - Review Complete 02/12/2021  Allergen Reaction Noted   Chlorpheniramine Itching 01/13/2012   Other  10/16/2016   Phenylpropanol  12/31/2016    Family History  Problem Relation Age of Onset   Stroke Mother    Kidney disease Mother    Cancer Father    Colon cancer Neg Hx     Rectal cancer Neg Hx     Social History   Socioeconomic History   Marital status: Divorced    Spouse name: Not on file   Number of children: 4   Years of education: Not on file   Highest education level: Not on file  Occupational History   Occupation: Retired  Tobacco Use   Smoking status: Never   Smokeless tobacco: Never  Vaping Use   Vaping Use: Never used  Substance and Sexual Activity   Alcohol use: No   Drug use: No   Sexual activity: Not Currently  Other Topics Concern   Not on file  Social  History Narrative   Not on file   Social Determinants of Health   Financial Resource Strain: Not on file  Food Insecurity: Not on file  Transportation Needs: Not on file  Physical Activity: Not on file  Stress: Not on file  Social Connections: Not on file  Intimate Partner Violence: Not on file    Physical Exam: Vital signs in last 24 hours: _0  10/17/1991 (Exact Date)  GEN: NAD EYE: Sclerae anicteric ENT: MMM CV: Non-tachycardic Pulm: CTA b/l GI: Soft, NT/ND NEURO:  Alert & Oriented x [place], DO [facility]   02/12/2021 10:15 AM

## 2021-02-12 NOTE — Op Note (Signed)
[facility] Patient Name: Bonnie Reed Procedure Date: 02/12/2021 10:24 AM MRN: [id number] Endoscopist: Gerrit Heck , MD Age: 75 Referring MD:  Date of Birth: 1945/04/06 Gender: Female Account #: [id number] Procedure:                Upper GI endoscopy Indications:              Epigastric abdominal pain, Suspected esophageal                            reflux Medicines:                Monitored Anesthesia Care Procedure:                Pre-Anesthesia Assessment:                           - Prior to the procedure, a History and Physical                            was performed, and patient medications and                            allergies were reviewed. The patient's tolerance of                            previous anesthesia was also reviewed. The risks                            and benefits of the procedure and the sedation                            options and risks were discussed with the patient.                            All questions were answered, and informed consent                            was obtained. Prior Anticoagulants: The patient has                            taken no previous anticoagulant or antiplatelet                            agents. ASA Grade Assessment: II - A patient with                            mild systemic disease. After reviewing the risks                            and benefits, the patient was deemed in                            satisfactory condition to undergo the procedure.  After obtaining informed consent, the endoscope was                            passed under direct vision. Throughout the                            procedure, the patient's blood pressure, pulse, and                            oxygen saturations were monitored continuously. The                            GIF [id number] #[id number] was introduced through the                            mouth, and advanced to the second part of duodenum.                             The upper GI endoscopy was accomplished without                            difficulty. The patient tolerated the procedure                            well. Scope In: Scope Out: Findings:                 The examined esophagus was normal.                           The Z-line was regular and was found 40 cm from the                            incisors.                           Scattered minimal inflammation characterized by                            erythema was found in the gastric body. Biopsies                            were taken with a cold forceps for Helicobacter                            pylori testing. Estimated blood loss was minimal.                           The gastric fundus, incisura, gastric antrum and                            pylorus were normal.                           The examined duodenum was normal. Complications:  No immediate complications. Estimated Blood Loss:     Estimated blood loss was minimal. Impression:               - Normal esophagus.                           - Z-line regular, 40 cm from the incisors.                           - Gastritis. Biopsied.                           - Normal gastric fundus, incisura, antrum and                            pylorus.                           - Normal examined duodenum. Recommendation:           - Patient has a contact number available for                            emergencies. The signs and symptoms of potential                            delayed complications were discussed with the                            patient. Return to normal activities tomorrow.                            Written discharge instructions were provided to the                            patient.                           - Resume previous diet.                           - Continue present medications.                           - Await pathology results.                           - Return to GI  clinic PRN. Gerrit Heck, MD 02/12/2021 11:12:35 AM

## 2021-02-12 NOTE — Op Note (Signed)
[facility] Patient Name: Bonnie Reed Procedure Date: 02/12/2021 10:11 AM MRN: [id number] Endoscopist: Gerrit Heck , MD Age: 75 Referring MD:  Date of Birth: 30-Nov-1945 Gender: Female Account #: [id number] Procedure:                Colonoscopy Indications:              High risk colon cancer surveillance: Personal                            history of colonic polyps, Incidental - Abdominal                            pain in the left lower quadrant, Incidental -                            Constipation Medicines:                Monitored Anesthesia Care Procedure:                Pre-Anesthesia Assessment:                           - Prior to the procedure, a History and Physical                            was performed, and patient medications and                            allergies were reviewed. The patient's tolerance of                            previous anesthesia was also reviewed. The risks                            and benefits of the procedure and the sedation                            options and risks were discussed with the patient.                            All questions were answered, and informed consent                            was obtained. Prior Anticoagulants: The patient has                            taken no previous anticoagulant or antiplatelet                            agents. ASA Grade Assessment: II - A patient with                            mild systemic disease. After reviewing the risks  and benefits, the patient was deemed in                            satisfactory condition to undergo the procedure.                           After obtaining informed consent, the colonoscope                            was passed under direct vision. Throughout the                            procedure, the patient's blood pressure, pulse, and                            oxygen saturations were monitored continuously. The                             CF [id number] #[id number] was introduced through the anus                            and advanced to the the cecum, identified by                            appendiceal orifice and ileocecal valve. The                            colonoscopy was performed without difficulty. The                            patient tolerated the procedure well. The quality                            of the bowel preparation was good. The ileocecal                            valve, appendiceal orifice, and rectum were                            photographed. Scope In: 10:37:29 AM Scope Out: 11:07:00 AM Scope Withdrawal Time: 0 hours 24 minutes 48 seconds  Total Procedure Duration: 0 hours 29 minutes 31 seconds  Findings:                 The perianal and digital rectal examinations were                            normal.                           A 3 mm polyp was found in the appendiceal orifice.                            The polyp was sessile. The polyp was removed with a  cold biopsy forceps. Resection and retrieval were                            complete. Estimated blood loss was minimal.                           Three sessile polyps were found in the descending                            colon and transverse colon. The polyps were 4 to 6                            mm in size. These polyps were removed with a cold                            snare. Resection and retrieval were complete.                            Estimated blood loss was minimal.                           Many sessile polyps were found in the rectum and                            distal sigmoid colon. The polyps were 1 to 3 mm in                            size. 13 of these polyps were removed with a cold                            snare. Resection and retrieval were complete.                            Estimated blood loss was minimal.                           Multiple small and large-mouthed  diverticula were                            found in the sigmoid colon, descending colon,                            transverse colon and ascending colon.                           Non-bleeding internal hemorrhoids and hypertrophied                            anal papillae were found during retroflexion. The                            hemorrhoids were small. Complications:            No immediate complications. Estimated Blood Loss:  Estimated blood loss was minimal. Impression:               - One 3 mm polyp at the appendiceal orifice,                            removed with a cold biopsy forceps. Resected and                            retrieved.                           - Three 4 to 6 mm polyps in the descending colon                            and in the transverse colon, removed with a cold                            snare. Resected and retrieved.                           - Many 1 to 3 mm polyps in the rectum and in the                            distal sigmoid colon, removed with a cold snare.                            Resected and retrieved.                           - Diverticulosis in the sigmoid colon, in the                            descending colon, in the transverse colon and in                            the ascending colon.                           - Non-bleeding internal hemorrhoids and                            hypertrophied anal papillae were found during                            retroflexion. Recommendation:           - Patient has a contact number available for                            emergencies. The signs and symptoms of potential                            delayed complications were discussed with the  patient. Return to normal activities tomorrow.                            Written discharge instructions were provided to the                            patient.                           - Resume previous diet.                            - Continue present medications.                           - Await pathology results.                           - Repeat colonoscopy for surveillance based on                            pathology results.                           - Return to GI clinic PRN. Gerrit Heck, MD 02/12/2021 11:18:14 AM

## 2021-02-12 NOTE — Progress Notes (Signed)
To PACU, VSS. Report to [email or web address] 

## 2021-02-18 ENCOUNTER — Telehealth: Payer: Self-pay

## 2021-02-18 ENCOUNTER — Inpatient Hospital Stay: Payer: Medicare HMO

## 2021-02-18 ENCOUNTER — Encounter: Payer: Medicare HMO | Admitting: Family

## 2021-02-18 NOTE — Telephone Encounter (Signed)
  Follow up Call-  Call back number 02/12/2021  Post procedure Call Back phone  # [phone]  Permission to leave phone message Yes  Some recent data might be hidden     Patient questions:  Do you have a fever, pain , or abdominal swelling? No. Pain Score  0 *  Have you tolerated food without any problems? Yes.    Have you been able to return to your normal activities? Yes.    Do you have any questions about your discharge instructions: Diet   No. Medications  No. Follow up visit  No.  Do you have questions or concerns about your Care? No.  Actions: * If pain score is 4 or above: No action needed, pain <4. Have you developed a fever since your procedure? no  2.   Have you had an respiratory symptoms (SOB or cough) since your procedure? no  3.   Have you tested positive for COVID 19 since your procedure no  4.   Have you had any family members/close contacts diagnosed with the COVID 19 since your procedure?  no   If yes to any of these questions please route to Joylene John, RN and Joella Prince, RN

## 2021-02-18 NOTE — Telephone Encounter (Signed)
Attempted f/u call. No answer, VM not setup. 

## 2021-02-25 ENCOUNTER — Other Ambulatory Visit: Payer: Self-pay | Admitting: Family

## 2021-02-25 ENCOUNTER — Inpatient Hospital Stay (HOSPITAL_BASED_OUTPATIENT_CLINIC_OR_DEPARTMENT_OTHER): Payer: Medicare HMO | Admitting: Family

## 2021-02-25 ENCOUNTER — Other Ambulatory Visit: Payer: Self-pay

## 2021-02-25 ENCOUNTER — Encounter: Payer: Self-pay | Admitting: Family

## 2021-02-25 ENCOUNTER — Inpatient Hospital Stay: Payer: Medicare HMO | Attending: Gastroenterology

## 2021-02-25 VITALS — BP 131/55 | HR 70 | Temp 98.2°F | Resp 18 | Ht 61.0 in | Wt 174.0 lb

## 2021-02-25 DIAGNOSIS — I110 Hypertensive heart disease with heart failure: Secondary | ICD-10-CM | POA: Insufficient documentation

## 2021-02-25 DIAGNOSIS — E1165 Type 2 diabetes mellitus with hyperglycemia: Secondary | ICD-10-CM | POA: Diagnosis not present

## 2021-02-25 DIAGNOSIS — Z853 Personal history of malignant neoplasm of breast: Secondary | ICD-10-CM

## 2021-02-25 DIAGNOSIS — R718 Other abnormality of red blood cells: Secondary | ICD-10-CM | POA: Diagnosis not present

## 2021-02-25 DIAGNOSIS — D569 Thalassemia, unspecified: Secondary | ICD-10-CM

## 2021-02-25 DIAGNOSIS — I509 Heart failure, unspecified: Secondary | ICD-10-CM | POA: Insufficient documentation

## 2021-02-25 DIAGNOSIS — D649 Anemia, unspecified: Secondary | ICD-10-CM

## 2021-02-25 LAB — CMP (CANCER CENTER ONLY)
ALT: 19 U/L (ref 0–44)
AST: 17 U/L (ref 15–41)
Albumin: 4.2 g/dL (ref 3.5–5.0)
Alkaline Phosphatase: 208 U/L — ABNORMAL HIGH (ref 38–126)
Anion gap: 12 (ref 5–15)
BUN: 24 mg/dL — ABNORMAL HIGH (ref 8–23)
CO2: 28 mmol/L (ref 22–32)
Calcium: 10 mg/dL (ref 8.9–10.3)
Chloride: 102 mmol/L (ref 98–111)
Creatinine: 0.93 mg/dL (ref 0.44–1.00)
GFR, Estimated: >60 mL/min (ref >60)
Glucose, Bld: 158 mg/dL — ABNORMAL HIGH (ref 70–99)
Potassium: 3.4 mmol/L — ABNORMAL LOW (ref 3.5–5.1)
Sodium: 142 mmol/L (ref 135–145)
Total Bilirubin: 0.4 mg/dL (ref 0.3–1.2)
Total Protein: 7.4 g/dL (ref 6.5–8.1)

## 2021-02-25 LAB — CBC WITH DIFFERENTIAL (CANCER CENTER ONLY)
Abs Immature Granulocytes: 0.08 10*3/uL — ABNORMAL HIGH (ref 0.00–0.07)
Basophils Absolute: 0.1 10*3/uL (ref 0.0–0.1)
Basophils Relative: 1 %
Eosinophils Absolute: 0.4 10*3/uL (ref 0.0–0.5)
Eosinophils Relative: 4 %
HCT: 42.2 % (ref 36.0–46.0)
Hemoglobin: 13 g/dL (ref 12.0–15.0)
Immature Granulocytes: 1 %
Lymphocytes Relative: 34 %
Lymphs Abs: 2.9 10*3/uL (ref 0.7–4.0)
MCH: 22.2 pg — ABNORMAL LOW (ref 26.0–34.0)
MCHC: 30.8 g/dL (ref 30.0–36.0)
MCV: 72 fL — ABNORMAL LOW (ref 80.0–100.0)
Monocytes Absolute: 1 10*3/uL (ref 0.1–1.0)
Monocytes Relative: 11 %
Neutro Abs: 4.2 10*3/uL (ref 1.7–7.7)
Neutrophils Relative %: 49 %
Platelet Count: 273 10*3/uL (ref 150–400)
RBC: 5.86 MIL/uL — ABNORMAL HIGH (ref 3.87–5.11)
RDW: 15.7 % — ABNORMAL HIGH (ref 11.5–15.5)
WBC Count: 8.6 10*3/uL (ref 4.0–10.5)
nRBC: 0 % (ref 0.0–0.2)

## 2021-02-25 LAB — IRON AND TIBC
Iron: 90 ug/dL (ref 41–142)
Saturation Ratios: 23 % (ref 21–57)
TIBC: 388 ug/dL (ref 236–444)
UIBC: 297 ug/dL (ref 120–384)

## 2021-02-25 LAB — RETICULOCYTES
Immature Retic Fract: 8.5 % (ref 2.3–15.9)
RBC.: 5.91 MIL/uL — ABNORMAL HIGH (ref 3.87–5.11)
Retic Count, Absolute: 75.6 10*3/uL (ref 19.0–186.0)
Retic Ct Pct: 1.3 % (ref 0.4–3.1)

## 2021-02-25 LAB — LACTATE DEHYDROGENASE: LDH: 172 U/L (ref 98–192)

## 2021-02-25 LAB — SAVE SMEAR(SSMR), FOR PROVIDER SLIDE REVIEW

## 2021-02-25 LAB — FERRITIN: Ferritin: 158 ng/mL (ref 11–307)

## 2021-02-25 NOTE — Progress Notes (Signed)
Hematology/Oncology Consultation   Name: Bonnie Reed      MRN: [id number]    Location: Room/bed info not found  Date: 02/25/2021 Time:9:42 AM   REFERRING PHYSICIAN:  Vito Cirigliano  REASON FOR CONSULT:  Possible thalassemia trait   DIAGNOSIS: Thalassemia/anemia work up pending   HISTORY OF PRESENT ILLNESS:  Bonnie Reed is a very pleasant 75 yo African American female with an over 4 year history of microcytosis without anemia.  She notes fatigue, itchy dry skin and craves ice.  She states that she does not sleep well at night. She states that she does snore but has not yet been tested for sleep apnea.  She has not noted any blood loss. No abnormal bruising, no petechiae.  She states that she has poorly controlled diabetes.  She also states that she is under a lot of stress. She lives with her sister who has dementia and was also caring for her brother in law who had dementia as well before he passed away.  She states that her mother had history of anemia.   She has history of left breast cancer diagnosed in 1997 followed by lumpectomy and 37 radiation treatments. No chemo treatment.  She had her mammogram in May which was negative.  Her father had history of lung cancer (smoker).  She has history of thyroid goiter.  She has 4 children and no history of miscarriage.  No fever, chills, n/v, cough, rash, dizziness, SOB, palpitations or changes in bowel or bladder habits.  She states that she has occasional episodes of mid sternal chest pain at night and most recently while cooking dinner. These episodes are brief and have resolved on their own. If it continues she will notify her PCP or call EMS if needed.  She has history of CHF and states that her HTN is currently well controlled.  She notes itchy dry skin.  She has occasional episodes of abdominal pain due to diverticulosis. She is careful about what she eats to avoid flares.  She has maintained a good appetite and is doing her best  to stay well hydrated. Her weight is described as stable at 174 lbs.  No smoking, ETOH or recreational drug use.  She enjoys walking for exercise.  She states that she worked for many years for an [employer]. She is now retired and enjoying spending time with her sweet family.   ROS: All other 10 point review of systems is negative.   PAST MEDICAL HISTORY:   Past Medical History:  Diagnosis Date   Allergy    Blood transfusion without reported diagnosis    Breast cancer ([place]) 1997   Left Breast Cancer   Cancer ([place])    CHF (congestive heart failure) ([place])    Diabetes mellitus without complication ([place])    Hypertension    Personal history of radiation therapy 1997   Left Breast Cancer    ALLERGIES: Allergies  Allergen Reactions   Chlorpheniramine Itching   Other     Hay Fever and Seasonal allergies    Phenylpropanol     Unknown/cannot remember      MEDICATIONS:  Current Outpatient Medications on File Prior to Visit  Medication Sig Dispense Refill   ACCU-CHEK SOFTCLIX LANCETS lancets Use to check blood sugar three times daily or as directed. 100 each 12   acetaminophen (TYLENOL) 500 MG tablet Take 1,000 mg by mouth daily as needed for mild pain.     aspirin EC 81 MG tablet Take  81 mg by mouth daily.     atorvastatin (LIPITOR) 40 MG tablet Take 1 tablet (40 mg total) by mouth daily. 90 tablet 3   Blood Glucose Monitoring Suppl (GLUCOCOM BLOOD GLUCOSE MONITOR) DEVI Use to test 3 times a day Dx:E11.9     diphenhydrAMINE (BENADRYL) 25 mg capsule Take 25 mg by mouth daily as needed for allergies.     fexofenadine (ALLEGRA) 180 MG tablet Take 180 mg by mouth.     glipiZIDE (GLUCOTROL) 10 MG tablet Take 10 mg by mouth 2 (two) times daily.     glucose blood (ONETOUCH VERIO) test strip Use to test 3 times a day Dx:E11.9 100 each 12   hydrochlorothiazide (HYDRODIURIL) 25 MG tablet Take 25 mg by mouth daily.     JARDIANCE 10 MG TABS tablet Take 1 tablet by  mouth once daily 90 tablet 0   Lancet Devices (ONE TOUCH DELICA LANCING DEV) MISC Use to test 3 times a day Dx:E11.9 1 each 0   LEVEMIR FLEXTOUCH 100 UNIT/ML FlexTouch Pen Inject into the skin. Take 60 units at bed time     NOVOLOG FLEXPEN 100 UNIT/ML FlexPen Inject into the skin. Take 15 units three times daily     Sod Picosulfate-Mag Ox-Cit Acd (CLENPIQ) 10-3.5-12 MG-GM -GM/160ML SOLN Take 1 kit by mouth as directed. 320 mL 0   sodium chloride (OCEAN) 0.65 % nasal spray Place 1 spray into the nose daily as needed for congestion.     valsartan (DIOVAN) 160 MG tablet Take 1 tablet by mouth once daily 90 tablet 0   No current facility-administered medications on file prior to visit.     PAST SURGICAL HISTORY Past Surgical History:  Procedure Laterality Date   APPENDECTOMY     BREAST LUMPECTOMY Left 1997   CESAREAN SECTION     CHOLECYSTECTOMY     REDUCTION MAMMAPLASTY Right 1998   TOTAL HIP ARTHROPLASTY Right    TUBAL LIGATION      FAMILY HISTORY: Family History  Problem Relation Age of Onset   Stroke Mother    Kidney disease Mother    Cancer Father    Colon cancer Neg Hx    Rectal cancer Neg Hx     SOCIAL HISTORY:  reports that she has never smoked. She has never used smokeless tobacco. She reports that she does not drink alcohol and does not use drugs.  PERFORMANCE STATUS: The patient's performance status is 1 - Symptomatic but completely ambulatory  PHYSICAL EXAM: Most Recent Vital Signs: Last menstrual period 10/17/1991. BP (!) 131/55 (BP Location: Right Arm, Patient Position: Sitting) Comment: BP ONLY on right arm  Pulse 70   Temp 98.2 F (36.8 C) (Oral)   Resp 18   Ht _0  (1.549 m)   Wt 174 lb (78.9 kg)   LMP 10/17/1991 (Exact Date)   SpO2 100%   BMI 32.88 kg/m   General Appearance:    Alert, cooperative, no distress, appears stated age  Head:    Normocephalic, without obvious abnormality, atraumatic  Eyes:    PERRL, conjunctiva/corneas clear, EOM's  intact, fundi    benign, both eyes  Ears:    Normal TM's and external ear canals, both ears  Nose:   Nares normal, septum midline, mucosa normal, no drainage    or sinus tenderness  Throat:   Lips, mucosa, and tongue normal; teeth and gums normal  Neck:   Supple, symmetrical, trachea midline, no adenopathy;    thyroid:  no enlargement/tenderness/nodules; no carotid  bruit or JVD  Back:     Symmetric, no curvature, ROM normal, no CVA tenderness  Lungs:     Clear to auscultation bilaterally, respirations unlabored  Chest Wall:    No tenderness or deformity   Heart:    Regular rate and rhythm, S1 and S2 normal, no murmur, rub   or gallop  Breast Exam:    No tenderness, masses, or nipple abnormality  Abdomen:     Soft, non-tender, bowel sounds active all four quadrants,    no masses, no organomegaly  Genitalia:    Normal female without lesion, discharge or tenderness  Rectal:    Normal tone, normal prostate, no masses or tenderness;   guaiac negative stool  Extremities:   Extremities normal, atraumatic, no cyanosis or edema  Pulses:   2+ and symmetric all extremities  Skin:   Skin color, texture, turgor normal, no rashes or lesions  Lymph nodes:   Cervical, supraclavicular, and axillary nodes normal  Neurologic:   CNII-XII intact, normal strength, sensation and reflexes    throughout    LABORATORY DATA:  Results for orders placed or performed in visit on 02/25/21 (from the past 48 hour(s))  CBC with Differential (Cancer Center Only)     Status: Abnormal   Collection Time: 02/25/21  8:43 AM  Result Value Ref Range   WBC Count 8.6 4.0 - 10.5 K/uL   RBC 5.86 (H) 3.87 - 5.11 MIL/uL   Hemoglobin 13.0 12.0 - 15.0 g/dL   HCT 42.2 36.0 - 46.0 %   MCV 72.0 (L) 80.0 - 100.0 fL   MCH 22.2 (L) 26.0 - 34.0 pg   MCHC 30.8 30.0 - 36.0 g/dL   RDW 15.7 (H) 11.5 - 15.5 %   Platelet Count 273 150 - 400 K/uL   nRBC 0.0 0.0 - 0.2 %   Neutrophils Relative % 49 %   Neutro Abs 4.2 1.7 - 7.7 K/uL    Lymphocytes Relative 34 %   Lymphs Abs 2.9 0.7 - 4.0 K/uL   Monocytes Relative 11 %   Monocytes Absolute 1.0 0.1 - 1.0 K/uL   Eosinophils Relative 4 %   Eosinophils Absolute 0.4 0.0 - 0.5 K/uL   Basophils Relative 1 %   Basophils Absolute 0.1 0.0 - 0.1 K/uL   Immature Granulocytes 1 %   Abs Immature Granulocytes 0.08 (H) 0.00 - 0.07 K/uL    Comment: Performed at [facility] Lab at [facility], [address], [place], [place] [address]  CMP ([place] only)     Status: Abnormal   Collection Time: 02/25/21  8:43 AM  Result Value Ref Range   Sodium 142 135 - 145 mmol/L   Potassium 3.4 (L) 3.5 - 5.1 mmol/L   Chloride 102 98 - 111 mmol/L   CO2 28 22 - 32 mmol/L   Glucose, Bld 158 (H) 70 - 99 mg/dL    Comment: Glucose reference range applies only to samples taken after fasting for at least 8 hours.   BUN 24 (H) 8 - 23 mg/dL   Creatinine 0.93 0.44 - 1.00 mg/dL   Calcium 10.0 8.9 - 10.3 mg/dL   Total Protein 7.4 6.5 - 8.1 g/dL   Albumin 4.2 3.5 - 5.0 g/dL   AST 17 15 - 41 U/L   ALT 19 0 - 44 U/L   Alkaline Phosphatase 208 (H) 38 - 126 U/L   Total Bilirubin 0.4 0.3 - 1.2 mg/dL   GFR, Estimated >60 >  60 mL/min    Comment: (NOTE) Calculated using the CKD-EPI Creatinine Equation (2021)    Anion gap 12 5 - 15    Comment: Performed at [facility] Lab at [facility], [address] [place], [place] [address]  Save Smear [facility])     Status: None   Collection Time: 02/25/21  8:43 AM  Result Value Ref Range   Smear Review SMEAR STAINED AND AVAILABLE FOR REVIEW     Comment: Performed at [facility] Lab at [facility], [address] [place], [place] [address]  Reticulocytes     Status: Abnormal   Collection Time: 02/25/21  8:44 AM  Result Value Ref Range   Retic Ct Pct 1.3 0.4 - 3.1 %   RBC. 5.91 (H) 3.87 - 5.11 MIL/uL   Retic Count, Absolute 75.6 19.0 - 186.0 K/uL   Immature Retic Fract 8.5 2.3 - 15.9 %     Comment: Performed at [facility] Lab at [facility], [address] [place], [place] [address]      RADIOGRAPHY: No results found.     PATHOLOGY: None  ASSESSMENT/PLAN: Ms. Barman is a very pleasant 75 yo [place] American female with an over 4 year history of microcytosis without anemia.  CBC, CMP and blood smear reviewd with Dr. Marin Olp.  Pateint had a few target cells noted.  Anemia and thalassemia work up pending. We will replace as indicated. She may benefit from folic acid daily.  Follow-up in 6 months.   All questions were answered. The patient knows to call the clinic with any problems, questions or concerns. We can certainly see the patient much sooner if necessary.  The patient was discussed with Dr. Marin Olp and he is in agreement with the aforementioned.   Lottie Dawson, NP

## 2021-02-26 ENCOUNTER — Telehealth: Payer: Self-pay | Admitting: *Deleted

## 2021-02-26 ENCOUNTER — Encounter: Payer: Self-pay | Admitting: Gastroenterology

## 2021-02-26 NOTE — Telephone Encounter (Signed)
Per 02/26/21 los - called and gave upcoming appointments - confirmed

## 2021-02-27 LAB — HGB FRACTIONATION CASCADE
Hgb A2: 2 % (ref 1.8–3.2)
Hgb A: 98 % (ref 96.4–98.8)
Hgb F: 0 % (ref 0.0–2.0)
Hgb S: 0 %

## 2021-02-27 LAB — ERYTHROPOIETIN: Erythropoietin: 6.6 m[IU]/mL (ref 2.6–18.5)

## 2021-03-06 ENCOUNTER — Telehealth: Payer: Self-pay | Admitting: Gastroenterology

## 2021-03-06 LAB — ALPHA-THALASSEMIA GENOTYPR

## 2021-03-06 NOTE — Telephone Encounter (Signed)
Called and spoke with patient in regards to pathology results and colonoscopy recall. Pt is aware that she should be receiving a letter in the mail with this information. Pt verbalized understanding of all information and had no concerns at the end of the call.   I have mailed a copy of result letter to patient.

## 2021-03-06 NOTE — Telephone Encounter (Signed)
Patient called stating she has not received the results from her recent colonoscopy.  I informed her a letter was mailed to her, but she would like to speak to someone regarding the results.  Please call.  Thank you.

## 2021-03-10 ENCOUNTER — Other Ambulatory Visit: Payer: Self-pay | Admitting: Family

## 2021-03-10 ENCOUNTER — Encounter: Payer: Self-pay | Admitting: *Deleted

## 2021-03-10 DIAGNOSIS — D563 Thalassemia minor: Secondary | ICD-10-CM

## 2021-03-10 MED ORDER — FOLIC ACID 1 MG PO TABS: 1.0000 mg | ORAL_TABLET | Freq: Every day | ORAL | 11 refills | Status: DC

## 2021-03-25 ENCOUNTER — Other Ambulatory Visit: Payer: Self-pay | Admitting: Family Medicine

## 2021-03-25 DIAGNOSIS — Z794 Long term (current) use of insulin: Secondary | ICD-10-CM

## 2021-03-25 DIAGNOSIS — E118 Type 2 diabetes mellitus with unspecified complications: Secondary | ICD-10-CM

## 2021-04-03 DIAGNOSIS — Z6830 Body mass index (BMI) 30.0-30.9, adult: Secondary | ICD-10-CM | POA: Diagnosis not present

## 2021-04-03 DIAGNOSIS — Z7982 Long term (current) use of aspirin: Secondary | ICD-10-CM | POA: Diagnosis not present

## 2021-04-03 DIAGNOSIS — M1990 Unspecified osteoarthritis, unspecified site: Secondary | ICD-10-CM | POA: Diagnosis not present

## 2021-04-03 DIAGNOSIS — E1165 Type 2 diabetes mellitus with hyperglycemia: Secondary | ICD-10-CM | POA: Diagnosis not present

## 2021-04-03 DIAGNOSIS — K5900 Constipation, unspecified: Secondary | ICD-10-CM | POA: Diagnosis not present

## 2021-04-03 DIAGNOSIS — E669 Obesity, unspecified: Secondary | ICD-10-CM | POA: Diagnosis not present

## 2021-04-03 DIAGNOSIS — Z7984 Long term (current) use of oral hypoglycemic drugs: Secondary | ICD-10-CM | POA: Diagnosis not present

## 2021-04-03 DIAGNOSIS — I10 Essential (primary) hypertension: Secondary | ICD-10-CM | POA: Diagnosis not present

## 2021-04-03 DIAGNOSIS — Z794 Long term (current) use of insulin: Secondary | ICD-10-CM | POA: Diagnosis not present

## 2021-04-03 DIAGNOSIS — E538 Deficiency of other specified B group vitamins: Secondary | ICD-10-CM | POA: Diagnosis not present

## 2021-04-03 DIAGNOSIS — Z7722 Contact with and (suspected) exposure to environmental tobacco smoke (acute) (chronic): Secondary | ICD-10-CM | POA: Diagnosis not present

## 2021-04-03 DIAGNOSIS — Z818 Family history of other mental and behavioral disorders: Secondary | ICD-10-CM | POA: Diagnosis not present

## 2021-04-23 DIAGNOSIS — U071 COVID-19: Secondary | ICD-10-CM | POA: Insufficient documentation

## 2021-04-23 HISTORY — DX: COVID-19: U07.1

## 2021-05-09 DIAGNOSIS — C50912 Malignant neoplasm of unspecified site of left female breast: Secondary | ICD-10-CM | POA: Diagnosis not present

## 2021-05-12 DIAGNOSIS — I10 Essential (primary) hypertension: Secondary | ICD-10-CM | POA: Diagnosis not present

## 2021-05-12 DIAGNOSIS — R079 Chest pain, unspecified: Secondary | ICD-10-CM | POA: Diagnosis not present

## 2021-05-12 DIAGNOSIS — M5137 Other intervertebral disc degeneration, lumbosacral region: Secondary | ICD-10-CM | POA: Diagnosis not present

## 2021-05-12 DIAGNOSIS — N644 Mastodynia: Secondary | ICD-10-CM | POA: Diagnosis not present

## 2021-05-12 DIAGNOSIS — K5904 Chronic idiopathic constipation: Secondary | ICD-10-CM | POA: Diagnosis not present

## 2021-05-12 DIAGNOSIS — E1165 Type 2 diabetes mellitus with hyperglycemia: Secondary | ICD-10-CM | POA: Diagnosis not present

## 2021-05-12 DIAGNOSIS — I2510 Atherosclerotic heart disease of native coronary artery without angina pectoris: Secondary | ICD-10-CM | POA: Diagnosis not present

## 2021-05-12 DIAGNOSIS — E049 Nontoxic goiter, unspecified: Secondary | ICD-10-CM | POA: Diagnosis not present

## 2021-05-12 DIAGNOSIS — E782 Mixed hyperlipidemia: Secondary | ICD-10-CM | POA: Diagnosis not present

## 2021-05-20 ENCOUNTER — Other Ambulatory Visit: Payer: Self-pay | Admitting: Family

## 2021-05-20 DIAGNOSIS — D563 Thalassemia minor: Secondary | ICD-10-CM

## 2021-05-21 ENCOUNTER — Encounter: Payer: Self-pay | Admitting: Family

## 2021-05-21 ENCOUNTER — Inpatient Hospital Stay: Payer: Medicare HMO | Attending: Gastroenterology

## 2021-05-21 ENCOUNTER — Other Ambulatory Visit: Payer: Self-pay

## 2021-05-21 ENCOUNTER — Inpatient Hospital Stay: Payer: Medicare HMO | Admitting: Family

## 2021-05-21 VITALS — BP 135/71 | HR 68 | Temp 98.2°F | Resp 17 | Ht 61.0 in | Wt 177.0 lb

## 2021-05-21 DIAGNOSIS — D563 Thalassemia minor: Secondary | ICD-10-CM | POA: Insufficient documentation

## 2021-05-21 DIAGNOSIS — R0602 Shortness of breath: Secondary | ICD-10-CM

## 2021-05-21 DIAGNOSIS — N6325 Unspecified lump in the left breast, overlapping quadrants: Secondary | ICD-10-CM | POA: Diagnosis not present

## 2021-05-21 DIAGNOSIS — N6321 Unspecified lump in the left breast, upper outer quadrant: Secondary | ICD-10-CM | POA: Diagnosis not present

## 2021-05-21 DIAGNOSIS — Z853 Personal history of malignant neoplasm of breast: Secondary | ICD-10-CM | POA: Diagnosis not present

## 2021-05-21 LAB — CMP (CANCER CENTER ONLY)
ALT: 15 U/L (ref 0–44)
AST: 18 U/L (ref 15–41)
Albumin: 4 g/dL (ref 3.5–5.0)
Alkaline Phosphatase: 177 U/L — ABNORMAL HIGH (ref 38–126)
Anion gap: 8 (ref 5–15)
BUN: 17 mg/dL (ref 8–23)
CO2: 27 mmol/L (ref 22–32)
Calcium: 9.5 mg/dL (ref 8.9–10.3)
Chloride: 108 mmol/L (ref 98–111)
Creatinine: 0.97 mg/dL (ref 0.44–1.00)
GFR, Estimated: >60 mL/min (ref >60)
Glucose, Bld: 105 mg/dL — ABNORMAL HIGH (ref 70–99)
Potassium: 4.6 mmol/L (ref 3.5–5.1)
Sodium: 143 mmol/L (ref 135–145)
Total Bilirubin: 0.3 mg/dL (ref 0.3–1.2)
Total Protein: 7.2 g/dL (ref 6.5–8.1)

## 2021-05-21 LAB — CBC WITH DIFFERENTIAL (CANCER CENTER ONLY)
Abs Immature Granulocytes: 0.01 10*3/uL (ref 0.00–0.07)
Basophils Absolute: 0.1 10*3/uL (ref 0.0–0.1)
Basophils Relative: 1 %
Eosinophils Absolute: 0.5 10*3/uL (ref 0.0–0.5)
Eosinophils Relative: 5 %
HCT: 42.7 % (ref 36.0–46.0)
Hemoglobin: 12.9 g/dL (ref 12.0–15.0)
Immature Granulocytes: 0 %
Lymphocytes Relative: 36 %
Lymphs Abs: 3.3 10*3/uL (ref 0.7–4.0)
MCH: 22 pg — ABNORMAL LOW (ref 26.0–34.0)
MCHC: 30.2 g/dL (ref 30.0–36.0)
MCV: 72.9 fL — ABNORMAL LOW (ref 80.0–100.0)
Monocytes Absolute: 0.9 10*3/uL (ref 0.1–1.0)
Monocytes Relative: 10 %
Neutro Abs: 4.4 10*3/uL (ref 1.7–7.7)
Neutrophils Relative %: 48 %
Platelet Count: 280 10*3/uL (ref 150–400)
RBC: 5.86 MIL/uL — ABNORMAL HIGH (ref 3.87–5.11)
RDW: 17.6 % — ABNORMAL HIGH (ref 11.5–15.5)
WBC Count: 9.2 10*3/uL (ref 4.0–10.5)
nRBC: 0 % (ref 0.0–0.2)

## 2021-05-21 LAB — LACTATE DEHYDROGENASE: LDH: 150 U/L (ref 98–192)

## 2021-05-21 NOTE — Progress Notes (Signed)
?Hematology and Oncology Follow Up Visit ? ?Bonnie Reed ?[id number] ?August 30, 1945 76 y.o. ?05/21/2021 ? ? ?Principle Diagnosis:  ?Alpha thalassemia minor trait ? ?Current Therapy:   ?Folic acid 1 mg PO daily ?  ?Interim History:  Bonnie Reed is here today for follow-up. She is doing fairly well but recently noted a new palpable mass in the left breast at the 2-3 o'clock position next to the nipple. This is palpable on exam and is 1 inch in length, 1/2 inch in width. She also had a small pea sized palpable mass at the 1 o'clock position. Both are along her old lumpectomy scar.  ?She states that she previously had left breast cancer in 1997 and was treated at [facility]. She can not remember what stage she was.  ?She is symptomatic with fatigue, chills, SOB and Chest discomfort that comes and goes. She has had some abdominal bloating and lower back pain.  ?She is not sleeping well.  ?No fever, n/v, cough, rash, dizziness, palpitations or changes in bowel or bladder habits.  ?She has constipation and takes Linzess as needed.  ?No blood loss noted. No bruising or petechiae.  ?She has tingling in her hands and toes.  ?No falls or syncope.  ?Her appetite is stable and she is doing her best to stay well hydrated. Her weight is stable at 177 lbs.  ? ?ECOG Performance Status: 1 - Symptomatic but completely ambulatory ? ?Medications:  ?Allergies as of 05/21/2021   ? ?   Reactions  ? Chlorpheniramine Itching  ? Other Other (See Comments)  ? Hay Fever and Seasonal allergies  ? Phenylpropanol Other (See Comments)  ? Unknown/cannot remember  ? ?  ? ?  ?Medication List  ?  ? ?  ? Accurate as of May 21, 2021  2:22 PM. If you have any questions, ask your nurse or doctor.  ?  ?  ? ?  ? ?Accu-Chek Softclix Lancets lancets ?Use to check blood sugar three times daily or as directed. ?  ?acetaminophen 500 MG tablet ?Commonly known as: TYLENOL ?Take 1,000 mg by mouth daily as needed for mild pain. ?  ?aspirin EC 81 MG  tablet ?Take 81 mg by mouth daily. ?  ?atorvastatin 40 MG tablet ?Commonly known as: LIPITOR ?Take 1 tablet (40 mg total) by mouth daily. ?  ?diphenhydrAMINE 25 mg capsule ?Commonly known as: BENADRYL ?Take 25 mg by mouth daily as needed for allergies. ?  ?fexofenadine 180 MG tablet ?Commonly known as: ALLEGRA ?Take 180 mg by mouth. ?  ?folic acid 1 MG tablet ?Commonly known as: FOLVITE ?Take 1 tablet (1 mg total) by mouth daily. ?  ?glipiZIDE 10 MG tablet ?Commonly known as: GLUCOTROL ?Take 10 mg by mouth 2 (two) times daily. ?  ?GlucoCom Blood Glucose Monitor Devi ?Use to test 3 times a day Dx:E11.9 ?  ?hydrochlorothiazide 25 MG tablet ?Commonly known as: HYDRODIURIL ?Take 25 mg by mouth daily. ?  ?Jardiance 10 MG Tabs tablet ?Generic drug: empagliflozin ?Take 1 tablet by mouth once daily ?  ?Levemir FlexTouch 100 UNIT/ML FlexPen ?Generic drug: insulin detemir ?Inject into the skin. Take 60 units at bed time ?  ?NovoLOG FlexPen 100 UNIT/ML FlexPen ?Generic drug: insulin aspart ?Inject into the skin. Take 15 units three times daily ?  ?ONE TOUCH DELICA LANCING DEV Misc ?Use to test 3 times a day Dx:E11.9 ?  ?OneTouch Verio test strip ?Generic drug: glucose blood ?Use to test 3 times a day Dx:E11.9 ?  ?  sodium chloride 0.65 % nasal spray ?Commonly known as: OCEAN ?Place 1 spray into the nose daily as needed for congestion. ?  ?valsartan 160 MG tablet ?Commonly known as: DIOVAN ?Take 1 tablet by mouth once daily ?  ? ?  ? ? ?Allergies:  ?Allergies  ?Allergen Reactions  ? Chlorpheniramine Itching  ? Other Other (See Comments)  ?  Hay Fever and Seasonal allergies ?  ? Phenylpropanol Other (See Comments)  ?  Unknown/cannot remember  ? ? ?Past Medical History, Surgical history, Social history, and Family History were reviewed and updated. ? ?Review of Systems: ?All other 10 point review of systems is negative.  ? ?Physical Exam: ? vitals were not taken for this visit.  ? ?Wt Readings from Last 3 Encounters:  ?02/25/21 174  lb (78.9 kg)  ?01/09/21 176 lb 6 oz (80 kg)  ?02/08/19 175 lb (79.4 kg)  ? ? ?Ocular: Sclerae unicteric, pupils equal, round and reactive to light ?Ear-nose-throat: Oropharynx clear, dentition fair ?Lymphatic: No cervical or supraclavicular adenopathy ?Lungs no rales or rhonchi, good excursion bilaterally ?Heart regular rate and rhythm, no murmur appreciated ?Abd soft, nontender, positive bowel sounds ?MSK no focal spinal tenderness, no joint edema ?Neuro: non-focal, well-oriented, appropriate affect ?Breasts: Deferred  ? ?Lab Results  ?Component Value Date  ? WBC 9.2 05/21/2021  ? HGB 12.9 05/21/2021  ? HCT 42.7 05/21/2021  ? MCV 72.9 (L) 05/21/2021  ? PLT 280 05/21/2021  ? ?Lab Results  ?Component Value Date  ? FERRITIN 158 02/25/2021  ? IRON 90 02/25/2021  ? TIBC 388 02/25/2021  ? UIBC 297 02/25/2021  ? IRONPCTSAT 23 02/25/2021  ? ?Lab Results  ?Component Value Date  ? RETICCTPCT 1.3 02/25/2021  ? RBC 5.86 (H) 05/21/2021  ? ?No results found for: KPAFRELGTCHN, LAMBDASER, KAPLAMBRATIO ?No results found for: IGGSERUM, IGA, IGMSERUM ?No results found for: TOTALPROTELP, ALBUMINELP, A1GS, A2GS, BETS, BETA2SER, GAMS, MSPIKE, SPEI ?  Chemistry   ?   ?Component Value Date/Time  ? NA 142 02/25/2021 0843  ? NA 141 02/08/2019 1049  ? K 3.4 (L) 02/25/2021 0843  ? CL 102 02/25/2021 0843  ? CO2 28 02/25/2021 0843  ? BUN 24 (H) 02/25/2021 0843  ? BUN 15 02/08/2019 1049  ? CREATININE 0.93 02/25/2021 0843  ?    ?Component Value Date/Time  ? CALCIUM 10.0 02/25/2021 0843  ? ALKPHOS 208 (H) 02/25/2021 3149  ? AST 17 02/25/2021 0843  ? ALT 19 02/25/2021 0843  ? BILITOT 0.4 02/25/2021 0843  ?  ? ? ? ?Impression and Plan: Bonnie Reed is a very pleasant 76 yo [place] American female with alpha thalassemia minor trait.  ?She is doing well on folic acid PO daily and will continue her same regimen.  ?CT of the chest ordered. Since she is also having SOB and chest discomfort we will move ahead with [place] and CT scan. She states that her cancer  was not detected with mammogram previously.  ?Follow-up in 8 months.  ? ?Lottie Dawson, NP ?3/1/20232:22 PM ? ?

## 2021-05-24 ENCOUNTER — Other Ambulatory Visit: Payer: Self-pay | Admitting: Family Medicine

## 2021-05-24 DIAGNOSIS — E118 Type 2 diabetes mellitus with unspecified complications: Secondary | ICD-10-CM

## 2021-05-24 DIAGNOSIS — Z794 Long term (current) use of insulin: Secondary | ICD-10-CM

## 2021-05-27 ENCOUNTER — Telehealth: Payer: Self-pay | Admitting: *Deleted

## 2021-05-27 ENCOUNTER — Other Ambulatory Visit: Payer: Self-pay | Admitting: Family

## 2021-05-27 DIAGNOSIS — N6321 Unspecified lump in the left breast, upper outer quadrant: Secondary | ICD-10-CM

## 2021-05-27 NOTE — Telephone Encounter (Signed)
Per 05/21/21 los - called and lvm of upcoming appointments - requested call back to confirm ?

## 2021-05-28 DIAGNOSIS — I10 Essential (primary) hypertension: Secondary | ICD-10-CM | POA: Diagnosis not present

## 2021-05-28 DIAGNOSIS — E1165 Type 2 diabetes mellitus with hyperglycemia: Secondary | ICD-10-CM | POA: Diagnosis not present

## 2021-05-28 DIAGNOSIS — K5904 Chronic idiopathic constipation: Secondary | ICD-10-CM | POA: Diagnosis not present

## 2021-05-28 DIAGNOSIS — Z0000 Encounter for general adult medical examination without abnormal findings: Secondary | ICD-10-CM | POA: Diagnosis not present

## 2021-05-28 DIAGNOSIS — M5137 Other intervertebral disc degeneration, lumbosacral region: Secondary | ICD-10-CM | POA: Diagnosis not present

## 2021-05-28 DIAGNOSIS — E782 Mixed hyperlipidemia: Secondary | ICD-10-CM | POA: Diagnosis not present

## 2021-05-28 DIAGNOSIS — I2510 Atherosclerotic heart disease of native coronary artery without angina pectoris: Secondary | ICD-10-CM | POA: Diagnosis not present

## 2021-05-28 DIAGNOSIS — E049 Nontoxic goiter, unspecified: Secondary | ICD-10-CM | POA: Diagnosis not present

## 2021-06-04 ENCOUNTER — Other Ambulatory Visit: Payer: Self-pay

## 2021-06-04 ENCOUNTER — Encounter (HOSPITAL_BASED_OUTPATIENT_CLINIC_OR_DEPARTMENT_OTHER): Payer: Self-pay

## 2021-06-04 ENCOUNTER — Ambulatory Visit (HOSPITAL_BASED_OUTPATIENT_CLINIC_OR_DEPARTMENT_OTHER)
Admission: RE | Admit: 2021-06-04 | Discharge: 2021-06-04 | Disposition: A | Discharge status: Home / Self Care | Payer: Medicare HMO | Source: Ambulatory Visit | Attending: Family | Admitting: Family

## 2021-06-04 DIAGNOSIS — R0602 Shortness of breath: Secondary | ICD-10-CM | POA: Diagnosis not present

## 2021-06-04 DIAGNOSIS — N6321 Unspecified lump in the left breast, upper outer quadrant: Secondary | ICD-10-CM | POA: Insufficient documentation

## 2021-06-04 MED ORDER — IOHEXOL 300 MG/ML  SOLN
100.0000 mL | Freq: Once | INTRAMUSCULAR | Status: AC | PRN
  Administered 2021-06-04: 80 mL via INTRAVENOUS

## 2021-06-05 ENCOUNTER — Encounter: Payer: Self-pay | Admitting: Family

## 2021-06-05 DIAGNOSIS — N6324 Unspecified lump in the left breast, lower inner quadrant: Secondary | ICD-10-CM | POA: Diagnosis not present

## 2021-06-05 DIAGNOSIS — R928 Other abnormal and inconclusive findings on diagnostic imaging of breast: Secondary | ICD-10-CM | POA: Diagnosis not present

## 2021-06-20 ENCOUNTER — Other Ambulatory Visit: Payer: Self-pay | Admitting: Radiology

## 2021-06-20 DIAGNOSIS — Z170 Estrogen receptor positive status [ER+]: Secondary | ICD-10-CM | POA: Diagnosis not present

## 2021-06-20 DIAGNOSIS — C50312 Malignant neoplasm of lower-inner quadrant of left female breast: Secondary | ICD-10-CM | POA: Diagnosis not present

## 2021-06-30 ENCOUNTER — Encounter: Payer: Self-pay | Admitting: *Deleted

## 2021-06-30 ENCOUNTER — Other Ambulatory Visit: Payer: Self-pay | Admitting: Family

## 2021-06-30 NOTE — Progress Notes (Signed)
Established patient who underwent breast workup after palpable mass felt on exam at previous visit. Biopsy confirmed invasive mammary carcinoma. She has an appointment with Dr Marlou Starks on 07/07/21. ? ?Called and spoke to patient. She confirmed the appointment date and time with Dr Marlou Starks. Reviewed with her that Dr Marin Olp generally sees early stage breast cancer patients after surgery. Explained that if she wanted to, we could bring her in before that time. She wanted to wait until after her surgery to come in to see Dr Marin Olp.  ? ?Oncology Nurse Navigator Documentation ? ? ?  06/30/2021  ?  1:00 PM  ?Oncology Nurse Navigator Flowsheets  ?Abnormal Finding Date 06/04/2021  ?Confirmed Diagnosis Date 06/20/2021  ?Diagnosis Status Confirmed Diagnosis Complete  ?Navigator Follow Up Date: 07/07/2021  ?Navigator Follow Up Reason: Appointment Review  ?Navigator Location [facility]  ?Navigator Encounter Type Introductory Phone Call  ?Patient Visit Type MedOnc  ?Treatment Phase Pre-Tx/Tx Discussion  ?Barriers/Navigation Needs Education  ?Education Other  ?Interventions Education;Psycho-Social Support  ?Acuity Level 2-Minimal Needs (1-2 Barriers Identified)  ?Education Method Verbal  ?Time Spent with Patient 30  ?  ?

## 2021-07-07 DIAGNOSIS — C50312 Malignant neoplasm of lower-inner quadrant of left female breast: Secondary | ICD-10-CM

## 2021-07-07 DIAGNOSIS — E049 Nontoxic goiter, unspecified: Secondary | ICD-10-CM | POA: Diagnosis not present

## 2021-07-07 DIAGNOSIS — Z170 Estrogen receptor positive status [ER+]: Secondary | ICD-10-CM | POA: Diagnosis not present

## 2021-07-07 HISTORY — DX: Malignant neoplasm of lower-inner quadrant of left female breast: C50.312

## 2021-07-08 ENCOUNTER — Encounter: Payer: Self-pay | Admitting: *Deleted

## 2021-07-08 NOTE — Progress Notes (Signed)
Patient was seen by Dr Marlou Starks for consideration of surgery. Due to her history, he would like her to see medical and radiation oncology before a decision is made on how to proceed.  ? ?Called and spoke to patient. Scheduled for 07/10/2021.  ? ?Oncology Nurse Navigator Documentation ? ? ?  07/08/2021  ?  8:30 AM  ?Oncology Nurse Navigator Flowsheets  ?Navigator Follow Up Date: 07/10/2021  ?Navigator Follow Up Reason: New Patient Appointment  ?Navigator Location [facility]  ?Navigator Encounter Type Appt/Treatment Plan Review;Telephone  ?Telephone Appt Confirmation/Clarification;Education;Outgoing Call  ?Patient Visit Type MedOnc  ?Treatment Phase Pre-Tx/Tx Discussion  ?Barriers/Navigation Needs Coordination of Care;Education  ?Education Other  ?Interventions Coordination of Care;Education;Psycho-Social Support  ?Acuity Level 2-Minimal Needs (1-2 Barriers Identified)  ?Education Method Verbal  ?Time Spent with Patient 30  ?  ?

## 2021-07-09 DIAGNOSIS — J019 Acute sinusitis, unspecified: Secondary | ICD-10-CM | POA: Diagnosis not present

## 2021-07-09 DIAGNOSIS — Z0100 Encounter for examination of eyes and vision without abnormal findings: Secondary | ICD-10-CM | POA: Diagnosis not present

## 2021-07-09 DIAGNOSIS — R519 Headache, unspecified: Secondary | ICD-10-CM | POA: Diagnosis not present

## 2021-07-09 DIAGNOSIS — E1165 Type 2 diabetes mellitus with hyperglycemia: Secondary | ICD-10-CM | POA: Diagnosis not present

## 2021-07-09 DIAGNOSIS — Z0001 Encounter for general adult medical examination with abnormal findings: Secondary | ICD-10-CM | POA: Diagnosis not present

## 2021-07-09 DIAGNOSIS — I10 Essential (primary) hypertension: Secondary | ICD-10-CM | POA: Diagnosis not present

## 2021-07-10 ENCOUNTER — Other Ambulatory Visit: Payer: Self-pay

## 2021-07-10 ENCOUNTER — Encounter: Payer: Self-pay | Admitting: *Deleted

## 2021-07-10 ENCOUNTER — Inpatient Hospital Stay: Payer: Medicare HMO | Attending: Gastroenterology

## 2021-07-10 ENCOUNTER — Encounter: Payer: Self-pay | Admitting: Hematology & Oncology

## 2021-07-10 ENCOUNTER — Inpatient Hospital Stay: Payer: Medicare HMO | Admitting: Hematology & Oncology

## 2021-07-10 VITALS — BP 146/63 | HR 88 | Temp 98.9°F | Resp 19 | Ht 61.0 in | Wt 176.0 lb

## 2021-07-10 DIAGNOSIS — C50012 Malignant neoplasm of nipple and areola, left female breast: Secondary | ICD-10-CM

## 2021-07-10 DIAGNOSIS — Z170 Estrogen receptor positive status [ER+]: Secondary | ICD-10-CM | POA: Insufficient documentation

## 2021-07-10 DIAGNOSIS — Z79810 Long term (current) use of selective estrogen receptor modulators (SERMs): Secondary | ICD-10-CM | POA: Diagnosis not present

## 2021-07-10 DIAGNOSIS — C50912 Malignant neoplasm of unspecified site of left female breast: Secondary | ICD-10-CM | POA: Insufficient documentation

## 2021-07-10 DIAGNOSIS — D563 Thalassemia minor: Secondary | ICD-10-CM

## 2021-07-10 DIAGNOSIS — N6321 Unspecified lump in the left breast, upper outer quadrant: Secondary | ICD-10-CM

## 2021-07-10 DIAGNOSIS — R0602 Shortness of breath: Secondary | ICD-10-CM

## 2021-07-10 DIAGNOSIS — I5022 Chronic systolic (congestive) heart failure: Secondary | ICD-10-CM

## 2021-07-10 LAB — CBC WITH DIFFERENTIAL (CANCER CENTER ONLY)
Abs Immature Granulocytes: 0.01 10*3/uL (ref 0.00–0.07)
Basophils Absolute: 0.1 10*3/uL (ref 0.0–0.1)
Basophils Relative: 1 %
Eosinophils Absolute: 0.4 10*3/uL (ref 0.0–0.5)
Eosinophils Relative: 5 %
HCT: 41.9 % (ref 36.0–46.0)
Hemoglobin: 12.8 g/dL (ref 12.0–15.0)
Immature Granulocytes: 0 %
Lymphocytes Relative: 28 %
Lymphs Abs: 2.2 10*3/uL (ref 0.7–4.0)
MCH: 22.2 pg — ABNORMAL LOW (ref 26.0–34.0)
MCHC: 30.5 g/dL (ref 30.0–36.0)
MCV: 72.7 fL — ABNORMAL LOW (ref 80.0–100.0)
Monocytes Absolute: 0.7 10*3/uL (ref 0.1–1.0)
Monocytes Relative: 9 %
Neutro Abs: 4.6 10*3/uL (ref 1.7–7.7)
Neutrophils Relative %: 57 %
Platelet Count: 319 10*3/uL (ref 150–400)
RBC: 5.76 MIL/uL — ABNORMAL HIGH (ref 3.87–5.11)
RDW: 16.8 % — ABNORMAL HIGH (ref 11.5–15.5)
WBC Count: 8 10*3/uL (ref 4.0–10.5)
nRBC: 0 % (ref 0.0–0.2)

## 2021-07-10 LAB — CMP (CANCER CENTER ONLY)
ALT: 16 U/L (ref 0–44)
AST: 20 U/L (ref 15–41)
Albumin: 4.1 g/dL (ref 3.5–5.0)
Alkaline Phosphatase: 237 U/L — ABNORMAL HIGH (ref 38–126)
Anion gap: 9 (ref 5–15)
BUN: 14 mg/dL (ref 8–23)
CO2: 25 mmol/L (ref 22–32)
Calcium: 9.5 mg/dL (ref 8.9–10.3)
Chloride: 106 mmol/L (ref 98–111)
Creatinine: 1.18 mg/dL — ABNORMAL HIGH (ref 0.44–1.00)
GFR, Estimated: 48 mL/min — ABNORMAL LOW (ref >60)
Glucose, Bld: 303 mg/dL — ABNORMAL HIGH (ref 70–99)
Potassium: 3.9 mmol/L (ref 3.5–5.1)
Sodium: 140 mmol/L (ref 135–145)
Total Bilirubin: 0.3 mg/dL (ref 0.3–1.2)
Total Protein: 7.4 g/dL (ref 6.5–8.1)

## 2021-07-10 LAB — LACTATE DEHYDROGENASE: LDH: 140 U/L (ref 98–192)

## 2021-07-11 ENCOUNTER — Ambulatory Visit: Payer: Medicare HMO | Attending: General Surgery

## 2021-07-11 ENCOUNTER — Other Ambulatory Visit: Payer: Self-pay | Admitting: General Surgery

## 2021-07-11 ENCOUNTER — Encounter: Payer: Self-pay | Admitting: *Deleted

## 2021-07-11 DIAGNOSIS — I890 Lymphedema, not elsewhere classified: Secondary | ICD-10-CM | POA: Insufficient documentation

## 2021-07-11 DIAGNOSIS — R293 Abnormal posture: Secondary | ICD-10-CM | POA: Diagnosis not present

## 2021-07-11 DIAGNOSIS — C50312 Malignant neoplasm of lower-inner quadrant of left female breast: Secondary | ICD-10-CM | POA: Diagnosis not present

## 2021-07-11 DIAGNOSIS — E049 Nontoxic goiter, unspecified: Secondary | ICD-10-CM

## 2021-07-11 DIAGNOSIS — Z170 Estrogen receptor positive status [ER+]: Secondary | ICD-10-CM | POA: Insufficient documentation

## 2021-07-11 NOTE — Progress Notes (Signed)
Patient returned call to PT and was scheduled today for evaluation.  ? ?Patient is scheduled for cardiac evaluation on 07/23/2021. ? ?MRI and Thyroid US have been ordered. Not yet authorized. Will schedule once authorization obtained.  ? ?Oncology Nurse Navigator Documentation ? ? ?  07/11/2021  ?  1:00 PM  ?Oncology Nurse Navigator Flowsheets  ?Navigator Follow Up Date: 07/15/2021  ?Navigator Follow Up Reason: Appointment Review  ?Navigator Location [facility]  ?Navigator Encounter Type Appt/Treatment Plan Review  ?Patient Visit Type MedOnc  ?Treatment Phase Pre-Tx/Tx Discussion  ?Barriers/Navigation Needs Coordination of Care;Education  ?Interventions None Required  ?Acuity Level 2-Minimal Needs (1-2 Barriers Identified)  ?Support Groups/Services Friends and Family  ?Time Spent with Patient 15  ?  ?

## 2021-07-11 NOTE — Progress Notes (Signed)
Initial RN Navigator Patient Visit ? ?Name: Bonnie Reed ?Diagnosis: Invasive ductal carcinoma of the LEFT breast -- ER+/HER2- ? ?Met with patient prior to their visit with MD. Hanley Seamen patient "Your Patient Navigator" handout which explains my role, areas in which I am able to help, and all the contact information for myself and the office. Also gave patient MD and Navigator business card. Reviewed with patient the general overview of expected course after initial diagnosis and time frame for all steps to be completed. ? ?New patient packet given to patient which includes: orientation to office and staff; campus directory; education on My Chart and Advance Directives; and patient centered education on breast cancer.  ? ?Patient's daughter was on the phone line with plans to be 'present' for MD visit. ? ?Per the chart, PT had been trying to reach patient to schedule based off the new referral. Patient states her phone blocks unknown numbers. I was able to speak to PT and give patient their number. She will call them this afternoon to schedule.  ? ?Patient completed visit with Dr. Marin Olp. ? ?Patient will need referral to cardiology. Will follow up tomorrow once note dictated and orders placed to schedule any additional needs. ? ?Patient understands all follow up procedures and expectations. They have my number to reach out for any further clarification or additional needs.  ? ?Oncology Nurse Navigator Documentation ? ? ?  07/10/2021  ? 11:00 AM  ?Oncology Nurse Navigator Flowsheets  ?Navigator Follow Up Date: 07/11/2021  ?Navigator Follow Up Reason: Appointment Review  ?Navigator Location [facility]  ?Navigator Encounter Type Initial MedOnc  ?Patient Visit Type MedOnc  ?Treatment Phase Pre-Tx/Tx Discussion  ?Barriers/Navigation Needs Coordination of Care;Education  ?Education Newly Diagnosed Cancer Education;Pain/ Symptom Management;Other  ?Interventions Education;Psycho-Social Support  ?Acuity Level 2-Minimal  Needs (1-2 Barriers Identified)  ?Education Method Verbal;Written  ?Support Groups/Services Friends and Family  ?Time Spent with Patient 30  ?   ?

## 2021-07-11 NOTE — Therapy (Signed)
?OUTPATIENT PHYSICAL THERAPY BREAST CANCER BASELINE EVALUATION ? ? ?Patient Name: Bonnie Reed ?MRN: [id number] ?DOB:[birth year], 76 y.o., female ?Today's Date: 07/11/2021 ? ? PT End of Session - 07/11/21 1222   ? ? Visit Number 1   ? Number of Visits 2   ? Date for PT Re-Evaluation 09/05/21   ? PT Start Time 1100   ? PT Stop Time 6712   ? PT Time Calculation (min) 75 min   ? Activity Tolerance Patient tolerated treatment well   ? Behavior During Therapy [facility] for tasks assessed/performed   ? ?  ?  ? ?  ? ? ?Past Medical History:  ?Diagnosis Date  ? Allergy   ? Blood transfusion without reported diagnosis   ? Breast cancer ([place]) 1997  ? Left Breast Cancer  ? Cancer [facility])   ? CHF (congestive heart failure) ([place])   ? Diabetes mellitus without complication ([place])   ? Hypertension   ? Personal history of radiation therapy 1997  ? Left Breast Cancer  ? ?Past Surgical History:  ?Procedure Laterality Date  ? APPENDECTOMY    ? BREAST LUMPECTOMY Left 1997  ? CESAREAN SECTION    ? CHOLECYSTECTOMY    ? REDUCTION MAMMAPLASTY Right 1998  ? TOTAL HIP ARTHROPLASTY Right   ? TUBAL LIGATION    ? ?Patient Active Problem List  ? Diagnosis Date Noted  ? Right leg pain 02/08/2019  ? Primary osteoarthritis of right knee 02/01/2018  ? Healthcare maintenance 04/21/2017  ? Current moderate episode of major depressive disorder without prior episode ([place]) 11/04/2016  ? Breast cancer ([place]) 10/16/2016  ? Type 2 diabetes mellitus with complication, with long-term current use of insulin ([place]) 10/16/2016  ? Hypertension 10/16/2016  ? CHF (congestive heart failure) ([place]) 10/16/2016  ? ? ?PCP: Benito Mccreedy, MD ? ?REFERRING PROVIDER: Jovita Kussmaul, MD ? ?REFERRING DIAG: Left Breast Cancer ? ?THERAPY DIAG:  ?Malignant neoplasm of lower-inner quadrant of left breast in female, estrogen receptor positive ([place]) ? ?Abnormal posture ? ?ONSET DATE: 06/22/2021 ? ?SUBJECTIVE                                                                                                                                                                                           ? ?SUBJECTIVE STATEMENT: ?Patient reports she is here today to be seen by her medical team for her newly diagnosed left breast cancer.  ? ?PERTINENT HISTORY:  ?Patient was diagnosed on 06/23/2021 with left Invasive ductal carcinoma. It measures 9 cm and is located in the lower inner quadrant. It is ER/PR + with a Ki67 of 15%.  She has a prior  hx of left breast cancer and is s/p left lumpectomy, and radiation with a full ALND in 1997.  She also has DM, congestive heart failure, and Thalassemia ? ?PATIENT GOALS   reduce lymphedema risk and learn post op HEP.  ? ?PAIN:  ?Are you having pain? Yes: NPRS scale: 7/10 ?Pain location: lateral breast, medial upper arm ?Pain description: tight,achy ?Aggravating factors: laying down at night, reaching ?Relieving factors: tylenol ? ? ?PRECAUTIONS: Active CA Other: DM, CHF,Thalassemia ,Pt  had a left proximal humerus fx not identified 4 years ago so it just healed on its own.  It has been limited and painful since then. Left UE lymphedema present for many years and not treated. (Garments ordered today) ? ?HAND DOMINANCE: right ? ?WEIGHT BEARING RESTRICTIONS No ? ?FALLS:  ?Has patient fallen in last 6 months? No ? ?LIVING ENVIRONMENT: ?Patient lives with: sister ?Lives in: House/apartment, has 13 steps into basement ?Has following equipment at home: Single point cane ? ?OCCUPATION: retired  ? ?LEISURE: joined [employer] but hasn't been recently, yard work ? ?PRIOR LEVEL OF FUNCTION: Independent ? ? ?OBJECTIVE ? ?COGNITION: ? Overall cognitive status: Within functional limits for tasks assessed   ? ?POSTURE:  ?Forward head and rounded shoulders posture ? ?UPPER EXTREMITY AROM/PROM: ? ?A/PROM RIGHT  07/11/2021 ?  ?Shoulder extension 45  ?Shoulder flexion 140  ?Shoulder abduction 135  ?Shoulder internal rotation 35, P!  ?Shoulder external rotation 85  ?  (Blank rows = not tested) ? ?A/PROM  LEFT  07/11/2021  ?Shoulder extension 48, Pain in chest and behind arm  ?Shoulder flexion 113, Pain back of arm, tingly  ?Shoulder abduction 100, pain axilla, top of shoulder  ?Shoulder internal rotation NT  ?Shoulder external rotation NT  ?  (Blank rows = not tested) ? ? ?CERVICAL AROM: ?All limited 50% ? ? ?UPPER EXTREMITY STRENGTH: WFL ? ? ?LYMPHEDEMA ASSESSMENTS:  ? ?[place] RIGHT  07/11/2021  ?10 cm proximal to olecranon process 31.6  ?Olecranon process 27.5  ?10 cm proximal to ulnar styloid process 23.7  ?Just proximal to ulnar styloid process 16.3  ?Across hand at thumb web space 20.0  ?At base of 2nd digit 7.0  ?(Blank rows = not tested) ? ?[place] LEFT  07/11/2021  ?10 cm proximal to olecranon process 35.2  ?Olecranon process 30.2  ?10 cm proximal to ulnar styloid process 23.2  ?Just proximal to ulnar styloid process 16.7  ?Across hand at thumb web space 19.5  ?At base of 2nd digit 7.0  ?(Blank rows = not tested) ? ? ?L-DEX LYMPHEDEMA SCREENING: ? ?The patient was NOT assessed using the L-Dex machine today to produce a lymphedema index baseline score due to already having left UE lymphedema ? ?QUICK DASH SURVEY:84% ? ? ?PATIENT EDUCATION:  ?Education details: Lymphedema risk reduction and post op shoulder/posture HEP, ABC class ?Person educated: Patient ?Education method: Explanation, Demonstration, Handout ?Education comprehension: Patient verbalized understanding and returned demonstration ? ? ?HOME EXERCISE PROGRAM: ?Patient was instructed today in a home exercise program today for post op shoulder range of motion. These included active assist shoulder flexion in supine, scapular retraction, wall walking with shoulder abduction, and hands behind head external rotation in supine.  She was encouraged to do these twice a day, holding 3 seconds and repeating 5 times when permitted by her physician.Also advised she may want to perform prior to surgery secondary to significant tightness present now. ? ?Pt was  measured for a Bella Strong flat knit sleeve and glove at physician request  secondary to presence of lymphedema. Purchased through [employer] and pt approved demographics being sent to [employer]. Size 5 Bella strong Reg. Length and size 3 Glove in Black ? ? ?ASSESSMENT: ? ?CLINICAL IMPRESSION: ?Her multidisciplinary medical team met prior to her assessments to determine a recommended treatment plan. She is planning to have a left Mastectomy, but does not yet have a date established. She will benefit from a post op PT reassessment to determine needs. She does NOT require L-Dex screens every 3 months for 2 years due to presence of lymphedema since previous Left breast surgery with ALND. She was measured today for a compression sleeve and glove and info will be sent to [place]. ? ?Pt will benefit from skilled therapeutic intervention to improve on the following deficits: Decreased knowledge of precautions, impaired UE functional use, pain, decreased ROM, postural dysfunction, swelling  ? ?PT treatment/interventions: ADL/self-care home management, pt/family education, therapeutic exercise ? ?REHAB POTENTIAL: Good ? ?CLINICAL DECISION MAKING: Stable/uncomplicated ? ?EVALUATION COMPLEXITY: Low ? ? ?GOALS: ?Goals reviewed with patient? YES ? ?LONG TERM GOALS: (STG=LTG) ? ? Name Target Date Goal status  ?1 Pt will be able to verbalize understanding of pertinent lymphedema risk reduction practices relevant to her dx specifically related to skin care.  ?Baseline:  No knowledge 07/11/2021 Achieved at eval  ?2 Pt will be able to return demo and/or verbalize understanding of the post op HEP related to regaining shoulder ROM. ?Baseline:  No knowledge 07/11/2021 Achieved at eval  ?3 Pt will be able to verbalize understanding of the importance of attending the post op After Breast CA Class for further lymphedema risk reduction education and therapeutic exercise.  ?Baseline:  No knowledge 07/11/2021 Achieved at eval   ?4 Pt will demo she has regained full shoulder ROM and function post operatively compared to baselines.  ?Baseline: See objective measurements taken today. 09/05/2021 INITIAL  ? ? ? ?PLAN: ?PT [facility]

## 2021-07-11 NOTE — Progress Notes (Signed)
?Hematology and Oncology Follow Up Visit ? ?Bonnie Reed ?[id number] ?[birth year] 76 y.o. ?07/11/2021 ? ? ?Principle Diagnosis:  ?Invasive ductal carcinoma of the LEFT breast -- ER+/HER2- ?Alpha thalassemia minor trait ? ?Current Therapy:   ?Folic acid 1 mg PO daily ?  ?Interim History:  [name] is here today for follow-up.  Unfortunately, looks like she may have a new problem.  She was recently found to have a palpable mass in the left breast. ? ?Of note, she has had a history of left breast cancer.  This is a 525 years ago.  This was when she was in [place].  She underwent lumpectomy and radiation therapy.  She is on tamoxifen. ? ?She had a mammogram done.  Unfortunately,  this was done outside of the [facility] system so I do not have the results.  However, it looks that this is a 9 mm tumor.  It is in the left lower quadrant of the breast.  However, she did undergo a biopsy.  This was done on 06/20/2021.  The pathology report ([id number]) showed an invasive ductal carcinoma.  It was ER positive/PR positive and HER2 negative.  The proliferation marker was 15%. ? ?She has seen surgery.  He recommended mastectomy because of her past radiation. ? ?There is also a possibility of lumpectomy if mastectomy cannot be done because a cardiac issues.  She says that she does have a history of congestive heart failure.  She does not see a cardiologist.  She probably will need to get a cardiologist involved. ? ?She looks good.  She feels good.  There is no shortness of breath.  She has had no nausea or vomiting.  There is no change in bowel or bladder habits.  There is no bony pain.  She has had no headache.  There is been no leg swelling.  She may have little bit of lymphedema in the left arm which is chronic. ? ?Overall, I would say performance status is probably ECOG 1.   ?Medications:  ?Allergies as of 07/10/2021   ? ?   Reactions  ? Chlorpheniramine Itching  ? Other Other (See Comments)  ? Hay Fever and Seasonal allergies   ? Phenylpropanol Other (See Comments)  ? Unknown/cannot remember  ? ?  ? ?  ?Medication List  ?  ? ?  ? Accurate as of July 10, 2021 11:59 PM. If you have any questions, ask your nurse or doctor.  ?  ?  ? ?  ? ?Accu-Chek Softclix Lancets lancets ?Use to check blood sugar three times daily or as directed. ?  ?acetaminophen 500 MG tablet ?Commonly known as: TYLENOL ?Take 1,000 mg by mouth daily as needed for mild pain. ?  ?amoxicillin-clavulanate 500-125 MG tablet ?Commonly known as: AUGMENTIN ?Take by mouth. ?  ?aspirin EC 81 MG tablet ?Take 81 mg by mouth daily. ?  ?atorvastatin 40 MG tablet ?Commonly known as: LIPITOR ?Take 1 tablet (40 mg total) by mouth daily. ?  ?diphenhydrAMINE 25 mg capsule ?Commonly known as: BENADRYL ?Take 25 mg by mouth daily as needed for allergies. ?  ?etodolac 500 MG tablet ?Commonly known as: LODINE ?Take 500 mg by mouth 2 (two) times daily. ?  ?fexofenadine 180 MG tablet ?Commonly known as: ALLEGRA ?Take 180 mg by mouth. ?  ?fluticasone 50 MCG/ACT nasal spray ?Commonly known as: FLONASE ?Place 2 sprays into both nostrils daily. ?  ?folic acid 1 MG tablet ?Commonly known as: FOLVITE ?Take 1 tablet (1 mg total) by mouth  daily. ?  ?gabapentin 100 MG capsule ?Commonly known as: NEURONTIN ?Take 100 mg by mouth 2 (two) times daily. ?  ?glipiZIDE 10 MG tablet ?Commonly known as: GLUCOTROL ?Take 10 mg by mouth 2 (two) times daily. ?  ?GlucoCom Blood Glucose Monitor Devi ?Use to test 3 times a day Dx:E11.9 ?  ?hydrochlorothiazide 25 MG tablet ?Commonly known as: HYDRODIURIL ?Take 25 mg by mouth daily. ?  ?Jardiance 10 MG Tabs tablet ?Generic drug: empagliflozin ?Take 1 tablet by mouth once daily ?  ?Levemir FlexTouch 100 UNIT/ML FlexPen ?Generic drug: insulin detemir ?Inject into the skin. Take 60 units at bed time ?  ?Linzess 145 MCG Caps capsule ?Generic drug: linaclotide ?Take 145 mcg by mouth daily. ?  ?NovoLOG FlexPen 100 UNIT/ML FlexPen ?Generic drug: insulin aspart ?Inject into the  skin. Take 15 units three times daily ?  ?ONE TOUCH DELICA LANCING DEV Misc ?Use to test 3 times a day Dx:E11.9 ?  ?OneTouch Verio test strip ?Generic drug: glucose blood ?Use to test 3 times a day Dx:E11.9 ?  ?sodium chloride 0.65 % nasal spray ?Commonly known as: OCEAN ?Place 1 spray into the nose daily as needed for congestion. ?  ?valsartan 160 MG tablet ?Commonly known as: DIOVAN ?Take 1 tablet by mouth once daily ?  ? ?  ? ? ?Allergies:  ?Allergies  ?Allergen Reactions  ? Chlorpheniramine Itching  ? Other Other (See Comments)  ?  Hay Fever and Seasonal allergies ?  ? Phenylpropanol Other (See Comments)  ?  Unknown/cannot remember  ? ? ?Past Medical History, Surgical history, Social history, and Family History were reviewed and updated. ? ?Review of Systems: ?Review of Systems  ?Constitutional: Negative.   ?HENT: Negative.  Negative for hearing loss.   ?Eyes: Negative.   ?Respiratory: Negative.    ?Cardiovascular: Negative.   ?Gastrointestinal: Negative.   ?Genitourinary: Negative.   ?Musculoskeletal: Negative.   ?Skin: Negative.   ?Neurological: Negative.   ?Endo/Heme/Allergies: Negative.   ?Psychiatric/Behavioral: Negative.    ? ? ?Physical Exam: ? height is $RemoveB'5\' 1"'igihTsnp$  (1.549 m) and weight is 176 lb (79.8 kg). Her oral temperature is 98.9 ?F (37.2 ?C). Her blood pressure is 146/63 (abnormal) and her pulse is 88. Her respiration is 19 and oxygen saturation is 98%.  ? ?Wt Readings from Last 3 Encounters:  ?07/10/21 176 lb (79.8 kg)  ?05/21/21 177 lb (80.3 kg)  ?02/25/21 174 lb (78.9 kg)  ? ?Physical Exam ?Vitals reviewed.  ?Constitutional:   ?   Comments: Breast exam shows right breast with no masses, edema or erythema.  There is no right axillary adenopathy.  Left breast does show changes from past surgery and radiation therapy.  She has little bit of firmness in the right breast from past radiation.  She has a lumpectomy that is well-healed.  There is no left axillary adenopathy.  ?HENT:  ?   Head: Normocephalic  and atraumatic.  ?Eyes:  ?   Pupils: Pupils are equal, round, and reactive to light.  ?Cardiovascular:  ?   Rate and Rhythm: Normal rate and regular rhythm.  ?   Heart sounds: Normal heart sounds.  ?   Comments: Cardiac exam shows regular rate and rhythm with normal S1 and S2.  I do not hear any murmurs, rubs or bruits. ?Pulmonary:  ?   Effort: Pulmonary effort is normal.  ?   Breath sounds: Normal breath sounds.  ?Abdominal:  ?   General: Bowel sounds are normal.  ?   Palpations: Abdomen  is soft.  ?Musculoskeletal:     ?   General: No tenderness or deformity. Normal range of motion.  ?   Cervical back: Normal range of motion.  ?   Comments: Extremity shows some mild chronic swelling in the left arm.  There is no swelling in the legs.  ?Lymphadenopathy:  ?   Cervical: No cervical adenopathy.  ?Skin: ?   General: Skin is warm and dry.  ?   Findings: No erythema or rash.  ?Neurological:  ?   Mental Status: She is alert and oriented to person, place, and time.  ?Psychiatric:     ?   Behavior: Behavior normal.     ?   Thought Content: Thought content normal.     ?   Judgment: Judgment normal.  ? ? ? ?Lab Results  ?Component Value Date  ? WBC 8.0 07/10/2021  ? HGB 12.8 07/10/2021  ? HCT 41.9 07/10/2021  ? MCV 72.7 (L) 07/10/2021  ? PLT 319 07/10/2021  ? ?Lab Results  ?Component Value Date  ? FERRITIN 158 02/25/2021  ? IRON 90 02/25/2021  ? TIBC 388 02/25/2021  ? UIBC 297 02/25/2021  ? IRONPCTSAT 23 02/25/2021  ? ?Lab Results  ?Component Value Date  ? RETICCTPCT 1.3 02/25/2021  ? RBC 5.76 (H) 07/10/2021  ? ?No results found for: KPAFRELGTCHN, LAMBDASER, KAPLAMBRATIO ?No results found for: IGGSERUM, IGA, IGMSERUM ?No results found for: TOTALPROTELP, ALBUMINELP, A1GS, A2GS, BETS, BETA2SER, GAMS, MSPIKE, SPEI ?  Chemistry   ?   ?Component Value Date/Time  ? NA 140 07/10/2021 1036  ? NA 141 02/08/2019 1049  ? K 3.9 07/10/2021 1036  ? CL 106 07/10/2021 1036  ? CO2 25 07/10/2021 1036  ? BUN 14 07/10/2021 1036  ? BUN 15  02/08/2019 1049  ? CREATININE 1.18 (H) 07/10/2021 1036  ?    ?Component Value Date/Time  ? CALCIUM 9.5 07/10/2021 1036  ? ALKPHOS 237 (H) 07/10/2021 1036  ? AST 20 07/10/2021 1036  ? ALT 16 07/10/2021 1036  ? BILI

## 2021-07-14 ENCOUNTER — Ambulatory Visit (HOSPITAL_COMMUNITY)
Admission: RE | Admit: 2021-07-14 | Discharge: 2021-07-14 | Disposition: A | Discharge status: Home / Self Care | Payer: Medicare HMO | Source: Ambulatory Visit | Attending: Hematology & Oncology | Admitting: Hematology & Oncology

## 2021-07-14 ENCOUNTER — Ambulatory Visit
Admission: RE | Admit: 2021-07-14 | Discharge: 2021-07-14 | Disposition: A | Discharge status: Home / Self Care | Payer: Medicare HMO | Source: Ambulatory Visit | Attending: General Surgery | Admitting: General Surgery

## 2021-07-14 ENCOUNTER — Telehealth: Payer: Self-pay | Admitting: *Deleted

## 2021-07-14 DIAGNOSIS — Z01818 Encounter for other preprocedural examination: Secondary | ICD-10-CM | POA: Diagnosis not present

## 2021-07-14 DIAGNOSIS — I5022 Chronic systolic (congestive) heart failure: Secondary | ICD-10-CM

## 2021-07-14 DIAGNOSIS — E119 Type 2 diabetes mellitus without complications: Secondary | ICD-10-CM | POA: Insufficient documentation

## 2021-07-14 DIAGNOSIS — C50919 Malignant neoplasm of unspecified site of unspecified female breast: Secondary | ICD-10-CM | POA: Insufficient documentation

## 2021-07-14 DIAGNOSIS — I110 Hypertensive heart disease with heart failure: Secondary | ICD-10-CM | POA: Insufficient documentation

## 2021-07-14 DIAGNOSIS — I080 Rheumatic disorders of both mitral and aortic valves: Secondary | ICD-10-CM | POA: Diagnosis not present

## 2021-07-14 DIAGNOSIS — E049 Nontoxic goiter, unspecified: Secondary | ICD-10-CM

## 2021-07-14 LAB — ECHOCARDIOGRAM COMPLETE
AR max vel: 2.05 cm2
AV Peak grad: 8.9 mmHg
Ao pk vel: 1.49 m/s
Area-P 1/2: 3.07 cm2
Calc EF: 61.4 %
S' Lateral: 2.6 cm
Single Plane A2C EF: 61.4 %
Single Plane A4C EF: 61.7 %

## 2021-07-14 NOTE — Telephone Encounter (Signed)
Pt has NEW PT APPT for pre op clearance with Dr. Agustin Cree 07/23/21. I will forward notes to MD for upcoming appt. Will send FYI to requesting office the pt has a NE PT appt 07/23/21.  ?

## 2021-07-14 NOTE — Telephone Encounter (Signed)
? ?  Pre-operative Risk Assessment  ?  ?Patient Name: Bonnie Reed  ?DOB: 1946/02/11 ?MRN: [id number]  ? ?PT WILL REQUIRE NEW PT APPT FOR PRE OP CLEARANCE FOR BREAST CANCER. WILL SEND A MESSAGE TO OUR CHART PREP TEAM FOR NEW PT APPT.   ? ?Request for Surgical Clearance   ? ?Procedure:   MASTECTOMY; LEFT BREAST ? ?Date of Surgery:  Clearance TBD                              ?   ?Surgeon:  DR. PAUL TOTH ?Surgeon's Group or Practice Name:  [facility] ?Phone number:  [phone] ?Fax number:  [phone] ?  ?Type of Clearance Requested:   ?- Medical  ?- Pharmacy:  Hold Aspirin   ?  ?Type of Anesthesia:  General  ?  ?Additional requests/questions:   ? ?Signed, ?Julaine Hua   ?07/14/2021, 2:48 PM  ? ?

## 2021-07-15 ENCOUNTER — Encounter: Payer: Self-pay | Admitting: *Deleted

## 2021-07-15 ENCOUNTER — Other Ambulatory Visit: Payer: Medicare HMO

## 2021-07-15 NOTE — Progress Notes (Signed)
Patient had her thyroid US which came back negative. She has yet to schedule her MRI. Per the chart someone did call on Friday to schedule and left a message.  ? ?Called and spoke to patient. She thought she had scheduled her MRI for Monday, but nothing it visible on her appointment list in [facility]. She will call GI and see about confirming her MRI appointment.  ? ?She called and scheduled her MRI for 07/16/21. ? ?Oncology Nurse Navigator Documentation ? ? ?  07/15/2021  ?  1:45 PM  ?Oncology Nurse Navigator Flowsheets  ?Navigator Follow Up Date: 07/17/2021  ?Navigator Follow Up Reason: Scan Review  ?Navigator Location [facility]  ?Navigator Encounter Type Telephone;Appt/Treatment Plan Review  ?Telephone Outgoing Call  ?Patient Visit Type MedOnc  ?Treatment Phase Pre-Tx/Tx Discussion  ?Barriers/Navigation Needs Coordination of Care;Education  ?Education Other  ?Interventions Coordination of Care;Education  ?Acuity Level 2-Minimal Needs (1-2 Barriers Identified)  ?Coordination of Care Radiology  ?Education Method Verbal  ?Support Groups/Services Friends and Family  ?Time Spent with Patient 15  ?  ?

## 2021-07-16 ENCOUNTER — Ambulatory Visit
Admission: RE | Admit: 2021-07-16 | Discharge: 2021-07-16 | Disposition: A | Discharge status: Home / Self Care | Payer: Medicare HMO | Source: Ambulatory Visit | Attending: Hematology & Oncology | Admitting: Hematology & Oncology

## 2021-07-16 DIAGNOSIS — Z170 Estrogen receptor positive status [ER+]: Secondary | ICD-10-CM

## 2021-07-16 DIAGNOSIS — N6489 Other specified disorders of breast: Secondary | ICD-10-CM | POA: Diagnosis not present

## 2021-07-16 MED ORDER — GADOBUTROL 1 MMOL/ML IV SOLN
8.0000 mL | Freq: Once | INTRAVENOUS | Status: AC | PRN
  Administered 2021-07-16: 8 mL via INTRAVENOUS

## 2021-07-17 ENCOUNTER — Encounter: Payer: Self-pay | Admitting: *Deleted

## 2021-07-17 NOTE — Progress Notes (Signed)
Patient had MRI yesterday. Results are still pending. She is scheduled for her appointment with cardiac to discuss surgical clearance on 07/23/2021. ? ?Patient called asking about results. Explained that they were still pending, but that the office would get back with her once results were posted and reviewed by Dr Marin Olp.  ? ?Oncology Nurse Navigator Documentation ? ? ?  07/17/2021  ? 12:30 PM  ?Oncology Nurse Navigator Flowsheets  ?Navigator Follow Up Date: 07/23/2021  ?Navigator Follow Up Reason: Review Note;Scan Review  ?Navigator Location [facility]  ?Navigator Encounter Type Appt/Treatment Plan Review  ?Telephone Incoming Call  ?Patient Visit Type MedOnc  ?Treatment Phase Pre-Tx/Tx Discussion  ?Barriers/Navigation Needs Coordination of Care;Education  ?Interventions Psycho-Social Support  ?Acuity Level 2-Minimal Needs (1-2 Barriers Identified)  ?Education Method Verbal  ?Support Groups/Services Friends and Family  ?Time Spent with Patient 15  ?  ?

## 2021-07-21 ENCOUNTER — Ambulatory Visit: Payer: Medicare HMO | Admitting: Podiatrist

## 2021-07-21 ENCOUNTER — Encounter: Payer: Self-pay | Admitting: Podiatrist

## 2021-07-21 DIAGNOSIS — E119 Type 2 diabetes mellitus without complications: Secondary | ICD-10-CM | POA: Diagnosis not present

## 2021-07-21 DIAGNOSIS — Z794 Long term (current) use of insulin: Secondary | ICD-10-CM | POA: Diagnosis not present

## 2021-07-21 DIAGNOSIS — E118 Type 2 diabetes mellitus with unspecified complications: Secondary | ICD-10-CM | POA: Diagnosis not present

## 2021-07-21 NOTE — Progress Notes (Signed)
Subjective: ?Bonnie Reed is a 76 y.o. female patient with history of diabetes who presents to office today for a diabetic foot exam as well as for care of toenails.  She relates her nails have become long and difficult to trim.  She was recommended to not trim them herself or go to a nail salon. Bonnie Reed relates some tingling in her feet but not consistent or constant.  She was recently diagnosed with Breast Cancer and has to undergo a mastectomy.  She was diagnosed with diabetes about 30 years ago.  ?Patient states that her most recent HgA1c was 9.5. Patient denies any new changes in medication or new problems. ? ?Benito Mccreedy, MD is her primary care provider ? ?Patient Active Problem List  ? Diagnosis Date Noted  ? Right leg pain 02/08/2019  ? Primary osteoarthritis of right knee 02/01/2018  ? Healthcare maintenance 04/21/2017  ? Current moderate episode of major depressive disorder without prior episode ([place]) 11/04/2016  ? Breast cancer ([place]) 10/16/2016  ? Type 2 diabetes mellitus with complication, with long-term current use of insulin (Bonnie Reed) 10/16/2016  ? Hypertension 10/16/2016  ? CHF (congestive heart failure) ([place]) 10/16/2016  ? ?Current Outpatient Medications on File Prior to Visit  ?Medication Sig Dispense Refill  ? ACCU-CHEK SOFTCLIX LANCETS lancets Use to check blood sugar three times daily or as directed. 100 each 12  ? acetaminophen (TYLENOL) 500 MG tablet Take 1,000 mg by mouth daily as needed for mild pain.    ? amoxicillin-clavulanate (AUGMENTIN) 500-125 MG tablet Take by mouth.    ? aspirin EC 81 MG tablet Take 81 mg by mouth daily.    ? atorvastatin (LIPITOR) 40 MG tablet Take 1 tablet (40 mg total) by mouth daily. 90 tablet 3  ? Blood Glucose Monitoring Suppl (GLUCOCOM BLOOD GLUCOSE MONITOR) DEVI Use to test 3 times a day Dx:E11.9    ? diphenhydrAMINE (BENADRYL) 25 mg capsule Take 25 mg by mouth daily as needed for allergies.    ? etodolac (LODINE) 500 MG tablet Take 500 mg by mouth 2  (two) times daily.    ? fexofenadine (ALLEGRA) 180 MG tablet Take 180 mg by mouth.    ? fluticasone (FLONASE) 50 MCG/ACT nasal spray Place 2 sprays into both nostrils daily.    ? folic acid (FOLVITE) 1 MG tablet Take 1 tablet (1 mg total) by mouth daily. 30 tablet 11  ? gabapentin (NEURONTIN) 100 MG capsule Take 100 mg by mouth 2 (two) times daily.    ? glipiZIDE (GLUCOTROL) 10 MG tablet Take 10 mg by mouth 2 (two) times daily.    ? glucose blood (ONETOUCH VERIO) test strip Use to test 3 times a day Dx:E11.9 100 each 12  ? hydrochlorothiazide (HYDRODIURIL) 25 MG tablet Take 25 mg by mouth daily.    ? JARDIANCE 10 MG TABS tablet Take 1 tablet by mouth once daily 90 tablet 0  ? Lancet Devices (ONE TOUCH DELICA LANCING DEV) MISC Use to test 3 times a day Dx:E11.9 1 each 0  ? LEVEMIR FLEXTOUCH 100 UNIT/ML FlexTouch Pen Inject into the skin. Take 60 units at bed time    ? LINZESS 145 MCG CAPS capsule Take 145 mcg by mouth daily.    ? NOVOLOG FLEXPEN 100 UNIT/ML FlexPen Inject into the skin. Take 15 units three times daily    ? sodium chloride (OCEAN) 0.65 % nasal spray Place 1 spray into the nose daily as needed for congestion.    ? valsartan (DIOVAN) 160  MG tablet Take 1 tablet by mouth once daily 90 tablet 0  ? ?No current facility-administered medications on file prior to visit.  ? ?Allergies  ?Allergen Reactions  ? Chlorpheniramine Itching  ? Other Other (See Comments)  ?  Hay Fever and Seasonal allergies ?  ? Phenylpropanol Other (See Comments)  ?  Unknown/cannot remember  ? ? ? ? ?Objective: ?General: Patient is awake, alert, and oriented x 3 and in no acute distress. ? ?Integument: Skin is warm, dry and supple bilateral. Nails are tender, long, mildly thickened and  ?discolored 1-5 bilateral. No signs of infection. No open lesions or preulcerative lesions present bilateral. Remaining integument unremarkable. ? ?Vasculature:  Dorsalis Pedis pulse 2/4 bilateral. Posterior Tibial pulse  1/4 bilateral.  ?Capillary  fill time <3 sec 1-5 bilateral. Positive hair growth to the level of the digits. ?Temperature gradient within normal limits. No varicosities present bilateral. No edema present bilateral.  ? ?Neurology: The patient has intact sensation measured with a 5.07/10g Semmes Weinstein Monofilament at all pedal sites bilateral . Vibratory sensation intact bilateral with tuning fork. No Babinski sign present bilateral.  ? ?Musculoskeletal: No symptomatic pedal deformities noted bilateral. Muscular strength 5/5 in all lower extremity muscular groups bilateral without pain on range of motion . No tenderness with calf compression bilateral. ? ?Assessment and Plan: ?Problem List Items Addressed This Visit   ?None ? ?  ICD-10-CM   ?1. Encounter for diabetic foot exam ([place])  E11.9   ?  ?2. Type 2 diabetes mellitus with complication, with long-term current use of insulin ([facility])  E11.8   ? Z79.4   ?  ? ? ? ?-Examined patient. ?-Discussed and educated patient on diabetic foot care, especially with  ?regards to the vascular, neurological and musculoskeletal systems.  ?-Stressed the importance of good glycemic control and the detriment of not  ?controlling glucose levels in relation to the foot. ?-Mechanically debrided all nails 1-5 bilateral using sterile nail nipper and filed with dremel without incident  ?-Answered all patient questions ?-Patient to return  in 3 months or will call for at risk foot care ?-Patient advised to call the office if any problems or questions arise in the meantime. ? ?Bronson Ing, DPM ?

## 2021-07-21 NOTE — Patient Instructions (Signed)

## 2021-07-22 ENCOUNTER — Other Ambulatory Visit: Payer: Self-pay

## 2021-07-22 DIAGNOSIS — D649 Anemia, unspecified: Secondary | ICD-10-CM | POA: Insufficient documentation

## 2021-07-22 DIAGNOSIS — F32A Depression, unspecified: Secondary | ICD-10-CM

## 2021-07-22 HISTORY — DX: Depression, unspecified: F32.A

## 2021-07-23 ENCOUNTER — Ambulatory Visit: Payer: Medicare HMO | Admitting: Cardiology

## 2021-07-23 ENCOUNTER — Encounter: Payer: Self-pay | Admitting: Cardiology

## 2021-07-23 ENCOUNTER — Other Ambulatory Visit: Payer: Self-pay | Admitting: Hematology & Oncology

## 2021-07-23 ENCOUNTER — Encounter: Payer: Self-pay | Admitting: *Deleted

## 2021-07-23 VITALS — BP 162/74 | HR 68 | Ht 61.0 in | Wt 178.2 lb

## 2021-07-23 DIAGNOSIS — Z01818 Encounter for other preprocedural examination: Secondary | ICD-10-CM

## 2021-07-23 DIAGNOSIS — I10 Essential (primary) hypertension: Secondary | ICD-10-CM | POA: Diagnosis not present

## 2021-07-23 DIAGNOSIS — R9389 Abnormal findings on diagnostic imaging of other specified body structures: Secondary | ICD-10-CM

## 2021-07-23 DIAGNOSIS — E782 Mixed hyperlipidemia: Secondary | ICD-10-CM | POA: Diagnosis not present

## 2021-07-23 DIAGNOSIS — Z170 Estrogen receptor positive status [ER+]: Secondary | ICD-10-CM

## 2021-07-23 DIAGNOSIS — I509 Heart failure, unspecified: Secondary | ICD-10-CM | POA: Diagnosis not present

## 2021-07-23 DIAGNOSIS — Z794 Long term (current) use of insulin: Secondary | ICD-10-CM

## 2021-07-23 DIAGNOSIS — E118 Type 2 diabetes mellitus with unspecified complications: Secondary | ICD-10-CM

## 2021-07-23 DIAGNOSIS — N6311 Unspecified lump in the right breast, upper outer quadrant: Secondary | ICD-10-CM

## 2021-07-23 DIAGNOSIS — C50312 Malignant neoplasm of lower-inner quadrant of left female breast: Secondary | ICD-10-CM

## 2021-07-23 NOTE — Patient Instructions (Signed)
Medication Instructions:  ?Your physician recommends that you continue on your current medications as directed. Please refer to the Current Medication list given to you today.  ?*If you need a refill on your cardiac medications before your next appointment, please call your pharmacy* ? ? ?Lab Work: ?None Ordered ?If you have labs (blood work) drawn today and your tests are completely normal, you will receive your results only by: ?MyChart Message (if you have MyChart) OR ?A paper copy in the mail ?If you have any lab test that is abnormal or we need to change your treatment, we will call you to review the results. ? ? ?Testing/Procedures: ?Your physician has requested that you have a lexiscan myoview. For further information please visit [email or web address]. Please follow instruction sheet, as given. ? ?The test will take approximately 3 to 4 hours to complete; you may bring reading material.  If someone comes with you to your appointment, they will need to remain in the main lobby due to limited space in the testing area. **If you are pregnant or breastfeeding, please notify the nuclear lab prior to your appointment** ? ?How to prepare for your Myocardial Perfusion Test: ?Do not eat or drink 3 hours prior to your test, except you may have water. ?Do not consume products containing caffeine (regular or decaffeinated) 12 hours prior to your test. (ex: coffee, chocolate, sodas, tea). ?Do bring a list of your current medications with you.  If not listed below, you may take your medications as normal. ?Do wear comfortable clothes (no dresses or overalls) and walking shoes, tennis shoes preferred (No heels or open toe shoes are allowed). ?Do NOT wear cologne, perfume, aftershave, or lotions (deodorant is allowed). ?If these instructions are not followed, your test will have to be rescheduled.   ? ? ?Follow-Up: ?At [facility], you and your health needs are our priority.  As part of our continuing mission to provide  you with exceptional heart care, we have created designated Provider Care Teams.  These Care Teams include your primary Cardiologist (physician) and Advanced Practice Providers (APPs -  Physician Assistants and Nurse Practitioners) who all work together to provide you with the care you need, when you need it. ? ?We recommend signing up for the patient portal called "MyChart".  Sign up information is provided on this After Visit Summary.  MyChart is used to connect with patients for Virtual Visits (Telemedicine).  Patients are able to view lab/test results, encounter notes, upcoming appointments, etc.  Non-urgent messages can be sent to your provider as well.   ?To learn more about what you can do with MyChart, go to [email or web address].   ? ?Your next appointment:   ?3 month(s) ? ?The format for your next appointment:   ?In Person ? ?Provider:   ?Jenne Campus, MD  ? ? ?Other Instructions ?NA  ?

## 2021-07-23 NOTE — Progress Notes (Signed)
? ?Cardiology Consultation:   ? ?Date:  07/23/2021  ? ?ID:  Bonnie Reed, DOB 1945-05-15, MRN [id number] ? ?PCP:  Benito Mccreedy, MD  ?Cardiologist:  Jenne Campus, MD  ? ?Referring MD: Volanda Napoleon, MD  ? ?Chief Complaint  ?Patient presents with  ? Congestive Heart Failure  ?  Clearance TBD Masectomy possible bilateral ?Dr. Marlou Starks   ? ? ?History of Present Illness:   ? ?Bonnie Reed is a 76 y.o. female who is being seen today for the evaluation of cardiovascular preop evaluation at the request of Ennever, Rudell Cobb, MD. past medical history significant for congestive heart failure, she was told about his diagnosis many years ago.  Essential hypertension, diabetes which is poorly controlled, dyslipidemia not treated, recently she was recognized to have a breast cancer mastectomy is planned.  She was referred to [place] for evaluation before that surgery.  Cardiac wise she said now she is doing well but couple months ago she described a situation when she was walking she was getting chest tightness.  Stress test associated with shortness of breath but lately she does not have this sensation.  Denies have any palpitations there is no swelling of lower extremities overall seems to be doing well.  She does have history of cardiac catheterization many years ago but not sure what it showed.  She said she can walk climb stairs with no major difficulties she does get short of breath ?She does not smoke never did ?Does have dyslipidemia however she stopped taking cholesterol medication because she thinks her cholesterol is always good, last cholesterol I see is from 2018 with LDL of 111, another cholesterol I have from 2 years ago with LDL of 103. ?She does not have any family history of premature coronary artery disease ? ?Past Medical History:  ?Diagnosis Date  ? Allergy   ? Blood transfusion without reported diagnosis   ? Breast cancer ([place]) 1997  ? Left Breast Cancer  ? Cancer [facility])   ? CHF (congestive heart  failure) ([place])   ? Diabetes mellitus without complication ([place])   ? Hypertension   ? Personal history of radiation therapy 1997  ? Left Breast Cancer  ? ? ?Past Surgical History:  ?Procedure Laterality Date  ? APPENDECTOMY    ? BREAST LUMPECTOMY Left 1997  ? CESAREAN SECTION    ? CHOLECYSTECTOMY    ? REDUCTION MAMMAPLASTY Right 1998  ? TOTAL HIP ARTHROPLASTY Right   ? TUBAL LIGATION    ? ? ?Current Medications: ?Current Meds  ?Medication Sig  ? ACCU-CHEK SOFTCLIX LANCETS lancets Use to check blood sugar three times daily or as directed. (Patient taking differently: 1 each by Other route See admin instructions. Use to check blood sugar three times daily or as directed.)  ? acetaminophen (TYLENOL) 500 MG tablet Take 1,000 mg by mouth daily as needed for mild pain.  ? amoxicillin-clavulanate (AUGMENTIN) 500-125 MG tablet Take 500 mg by mouth 3 (three) times daily. Dental procedures  ? aspirin EC 81 MG tablet Take 81 mg by mouth daily.  ? Blood Glucose Monitoring Suppl (GLUCOCOM BLOOD GLUCOSE MONITOR) DEVI 1 each by Other route daily.  ? diphenhydrAMINE (BENADRYL) 25 mg capsule Take 25 mg by mouth daily as needed for allergies.  ? etodolac (LODINE) 500 MG tablet Take 500 mg by mouth 2 (two) times daily.  ? fexofenadine (ALLEGRA) 180 MG tablet Take 180 mg by mouth daily.  ? fluticasone (FLONASE) 50 MCG/ACT nasal spray Place 2 sprays into  both nostrils daily.  ? folic acid (FOLVITE) 1 MG tablet Take 1 tablet (1 mg total) by mouth daily.  ? gabapentin (NEURONTIN) 100 MG capsule Take 100 mg by mouth 2 (two) times daily.  ? glipiZIDE (GLUCOTROL) 10 MG tablet Take 10 mg by mouth 2 (two) times daily.  ? glucose blood (ONETOUCH VERIO) test strip Use to test 3 times a day Dx:E11.9 (Patient taking differently: 1 each by Other route See admin instructions. Use to test 3 times a day Dx:E11.9)  ? hydrochlorothiazide (HYDRODIURIL) 25 MG tablet Take 25 mg by mouth daily.  ? JARDIANCE 10 MG TABS tablet Take 1 tablet by mouth once daily  (Patient taking differently: Take 10 mg by mouth daily.)  ? Lancet Devices (ONE TOUCH DELICA LANCING DEV) MISC Use to test 3 times a day Dx:E11.9 (Patient taking differently: 1 each by Other route See admin instructions. Use to test 3 times a day Dx:E11.9)  ? LEVEMIR FLEXTOUCH 100 UNIT/ML FlexTouch Pen Inject 60 Units into the skin at bedtime. Take 60 units at bed time  ? LINZESS 145 MCG CAPS capsule Take 145 mcg by mouth daily.  ? NOVOLOG FLEXPEN 100 UNIT/ML FlexPen Inject 15 Units into the skin 3 (three) times daily with meals. Take 15 units three times daily  ? sodium chloride (OCEAN) 0.65 % nasal spray Place 1 spray into the nose daily as needed for congestion.  ? valsartan (DIOVAN) 160 MG tablet Take 1 tablet by mouth once daily (Patient taking differently: Take 160 mg by mouth daily.)  ?  ? ?Allergies:   Phenylpropanol  ? ?Social History  ? ?Socioeconomic History  ? Marital status: Divorced  ?  Spouse name: Not on file  ? Number of children: 4  ? Years of education: Not on file  ? Highest education level: Not on file  ?Occupational History  ? Occupation: Retired  ?Tobacco Use  ? Smoking status: Never  ? Smokeless tobacco: Never  ?Vaping Use  ? Vaping Use: Never used  ?Substance and Sexual Activity  ? Alcohol use: No  ? Drug use: No  ? Sexual activity: Not Currently  ?Other Topics Concern  ? Not on file  ?Social History Narrative  ? Not on file  ? ?Social Determinants of Health  ? ?Financial Resource Strain: Not on file  ?Food Insecurity: Not on file  ?Transportation Needs: Not on file  ?Physical Activity: Not on file  ?Stress: Not on file  ?Social Connections: Not on file  ?  ? ?Family History: ?The patient's family history includes Cancer in her father; Kidney disease in her mother; Stroke in her mother. There is no history of Colon cancer or Rectal cancer. ?ROS:   ?Please see the history of present illness.    ?All 14 point review of systems negative except as described per history of present  illness. ? ?EKGs/Labs/Other Studies Reviewed:   ? ?The following studies were reviewed today: ?Echocardiogram done in July 14, 2021 showed: ?1. Left ventricular ejection fraction, by estimation, is 60 to 65%. The  ?left ventricle has normal function. The left ventricle has no regional  ?wall motion abnormalities. There is moderate concentric left ventricular  ?hypertrophy. Left ventricular  ?diastolic parameters are consistent with Grade I diastolic dysfunction  ?(impaired relaxation).  ? 2. Right ventricular systolic function is normal. The right ventricular  ?size is normal. Tricuspid regurgitation signal is inadequate for assessing  ?PA pressure.  ? 3. The mitral valve is abnormal. Trivial mitral valve regurgitation.  ?Moderate  mitral annular calcification.  ? 4. The aortic valve is tricuspid. There is mild calcification of the  ?aortic valve. There is mild thickening of the aortic valve. Aortic valve  ?regurgitation is mild. Aortic valve sclerosis/calcification is present,  ?without any evidence of aortic  ?stenosis.  ? 5. The inferior vena cava is normal in size with greater than 50%  ?respiratory variability, suggesting right atrial pressure of 3 mmHg.  ? ? ?EKG:  EKG is  ordered today.  The ekg ordered today demonstrates normal sinus rhythm left axis deviation incomplete right bundle branch block, criteria for LVH ? ?Recent Labs: ?07/10/2021: ALT 16; BUN 14; Creatinine 1.18; Hemoglobin 12.8; Platelet Count 319; Potassium 3.9; Sodium 140  ?Recent Lipid Panel ?   ?Component Value Date/Time  ? CHOL 162 02/08/2019 1049  ? TRIG 100 02/08/2019 1049  ? HDL 41 02/08/2019 1049  ? CHOLHDL 4.0 02/08/2019 1049  ? LDLCALC 103 (H) 02/08/2019 1049  ? ? ?Physical Exam:   ? ?VS:  BP (!) 162/74 (BP Location: Left Arm, Patient Position: Sitting)   Pulse 68   Ht '5\' 1"'$  (1.549 m)   Wt 178 lb 3.2 oz (80.8 kg)   LMP 10/17/1991 (Exact Date)   SpO2 94%   BMI 33.67 kg/m?    ? ?Wt Readings from Last 3 Encounters:  ?07/23/21 178 lb  3.2 oz (80.8 kg)  ?07/10/21 176 lb (79.8 kg)  ?05/21/21 177 lb (80.3 kg)  ?  ? ?GEN:  Well nourished, well developed in no acute distress ?HEENT: Normal ?NECK: No JVD; No carotid bruits ?LYMPHATICS: No lymphadenopathy ?C

## 2021-07-23 NOTE — Progress Notes (Signed)
Patient calling with questions about her MRI. She had a known mass to her LEFT breast however MRI picked up new RIGHT breast mass. Their recommendation is for US biopsy. Reviewed with Dr Marin Olp and he placed the order. ? ?Patient was seen by cardiology today and is scheduled for a stress test tomorrow.  ? ?Patient is aware of new orders placed.  ? ?Called the navigator at [facility]. She will send all the necessary persons information on this patient to attempt to expedite scheduling for this patient.  ? ?Will follow to ensure scheduling.  ? ?Oncology Nurse Navigator Documentation ? ? ?  07/23/2021  ?  1:15 PM  ?Oncology Nurse Navigator Flowsheets  ?Navigator Follow Up Date: 07/25/2021  ?Navigator Follow Up Reason: Appointment Review  ?Navigator Location [facility]  ?Navigator Encounter Type Telephone;Appt/Treatment Plan Review;Diagnostic Results  ?Telephone Incoming Call  ?Patient Visit Type MedOnc  ?Treatment Phase Pre-Tx/Tx Discussion  ?Barriers/Navigation Needs Coordination of Care;Education  ?Education Other  ?Interventions Coordination of Care;Education;Psycho-Social Support  ?Acuity Level 2-Minimal Needs (1-2 Barriers Identified)  ?Coordination of Care Other  ?Education Method Verbal  ?Support Groups/Services Friends and Family  ?Time Spent with Patient 30  ?  ?

## 2021-07-24 ENCOUNTER — Ambulatory Visit: Payer: Medicare HMO

## 2021-07-24 ENCOUNTER — Ambulatory Visit (INDEPENDENT_AMBULATORY_CARE_PROVIDER_SITE_OTHER): Payer: Medicare HMO

## 2021-07-24 DIAGNOSIS — I509 Heart failure, unspecified: Secondary | ICD-10-CM | POA: Diagnosis not present

## 2021-07-24 DIAGNOSIS — I10 Essential (primary) hypertension: Secondary | ICD-10-CM | POA: Diagnosis not present

## 2021-07-24 DIAGNOSIS — Z01818 Encounter for other preprocedural examination: Secondary | ICD-10-CM

## 2021-07-24 DIAGNOSIS — Z01810 Encounter for preprocedural cardiovascular examination: Secondary | ICD-10-CM | POA: Diagnosis not present

## 2021-07-24 LAB — MYOCARDIAL PERFUSION IMAGING
LV dias vol: 84 mL (ref 46–106)
LV sys vol: 32 mL
Nuc Stress EF: 62 %
Peak HR: 85 {beats}/min
Rest HR: 68 {beats}/min
Rest Nuclear Isotope Dose: 10.3 mCi
SDS: 1
SRS: 2
SSS: 3
Stress Nuclear Isotope Dose: 32.5 mCi
TID: 0.96

## 2021-07-24 MED ORDER — REGADENOSON 0.4 MG/5ML IV SOLN
0.4000 mg | Freq: Once | INTRAVENOUS | Status: AC
  Administered 2021-07-24: 0.4 mg via INTRAVENOUS

## 2021-07-24 MED ORDER — TECHNETIUM TC 99M TETROFOSMIN IV KIT
10.3000 | PACK | Freq: Once | INTRAVENOUS | Status: AC | PRN
  Administered 2021-07-24: 10.3 via INTRAVENOUS

## 2021-07-24 MED ORDER — TECHNETIUM TC 99M TETROFOSMIN IV KIT
32.5000 | PACK | Freq: Once | INTRAVENOUS | Status: AC | PRN
  Administered 2021-07-24: 32.5 via INTRAVENOUS

## 2021-07-25 ENCOUNTER — Encounter: Payer: Self-pay | Admitting: *Deleted

## 2021-07-25 NOTE — Progress Notes (Signed)
Patient scheduled for US breast with possible biopsy on 07/28/21. ? ?Oncology Nurse Navigator Documentation ? ? ?  07/25/2021  ?  8:00 AM  ?Oncology Nurse Navigator Flowsheets  ?Navigator Follow Up Date: 07/28/2021  ?Navigator Follow Up Reason: Appointment Review  ?Navigator Location [facility]  ?Navigator Encounter Type Appt/Treatment Plan Review  ?Patient Visit Type MedOnc  ?Treatment Phase Pre-Tx/Tx Discussion  ?Barriers/Navigation Needs Coordination of Care;Education  ?Interventions None Required  ?Acuity Level 2-Minimal Needs (1-2 Barriers Identified)  ?Support Groups/Services Friends and Family  ?Time Spent with Patient 15  ?  ?

## 2021-07-28 ENCOUNTER — Other Ambulatory Visit: Payer: Self-pay | Admitting: Hematology & Oncology

## 2021-07-28 ENCOUNTER — Ambulatory Visit
Admission: RE | Admit: 2021-07-28 | Discharge: 2021-07-28 | Disposition: A | Discharge status: Home / Self Care | Payer: Medicare HMO | Source: Ambulatory Visit | Attending: Hematology & Oncology | Admitting: Hematology & Oncology

## 2021-07-28 DIAGNOSIS — N6489 Other specified disorders of breast: Secondary | ICD-10-CM | POA: Diagnosis not present

## 2021-07-28 DIAGNOSIS — N6311 Unspecified lump in the right breast, upper outer quadrant: Secondary | ICD-10-CM

## 2021-07-28 DIAGNOSIS — R9389 Abnormal findings on diagnostic imaging of other specified body structures: Secondary | ICD-10-CM

## 2021-07-30 ENCOUNTER — Other Ambulatory Visit: Payer: Self-pay | Admitting: Hematology & Oncology

## 2021-07-30 ENCOUNTER — Encounter: Payer: Self-pay | Admitting: *Deleted

## 2021-07-30 DIAGNOSIS — N6311 Unspecified lump in the right breast, upper outer quadrant: Secondary | ICD-10-CM

## 2021-07-30 DIAGNOSIS — N6310 Unspecified lump in the right breast, unspecified quadrant: Secondary | ICD-10-CM

## 2021-07-30 NOTE — Progress Notes (Signed)
Patient had her [place] on Monday, but the enhancing mass seen on MRI was not visualized. The radiologist recommends a MRI-guided biopsy. ? ?Spoke to Dr Marin Olp and he agrees with order. Order placed.  ? ?Patient called looking for follow up. I explained that we will place the order this morning and I will reach out to GI to help expedite the appointment. She was greatful for the information.  ? ?Email sent to [facility] requesting expedited appointment. They were able to schedule for 08/01/21. ? ?Oncology Nurse Navigator Documentation ? ? ?  07/30/2021  ?  9:15 AM  ?Oncology Nurse Navigator Flowsheets  ?Navigator Follow Up Date: 08/01/2021  ?Navigator Follow Up Reason: Other:  ?Financial planner  ?Navigator Encounter Type Appt/Treatment Plan Review;Telephone  ?Telephone Outgoing Call  ?Patient Visit Type MedOnc  ?Treatment Phase Pre-Tx/Tx Discussion  ?Barriers/Navigation Needs Coordination of Care;Education  ?Education Other  ?Interventions Coordination of Care;Education  ?Acuity Level 2-Minimal Needs (1-2 Barriers Identified)  ?Coordination of Care Radiology  ?Education Method Verbal  ?Support Groups/Services Friends and Family  ?Time Spent with Patient 30  ?  ?

## 2021-08-01 ENCOUNTER — Encounter: Payer: Self-pay | Admitting: *Deleted

## 2021-08-01 ENCOUNTER — Ambulatory Visit
Admission: RE | Admit: 2021-08-01 | Discharge: 2021-08-01 | Disposition: A | Discharge status: Home / Self Care | Payer: Medicare HMO | Source: Ambulatory Visit | Attending: Hematology & Oncology | Admitting: Hematology & Oncology

## 2021-08-01 DIAGNOSIS — N6310 Unspecified lump in the right breast, unspecified quadrant: Secondary | ICD-10-CM

## 2021-08-01 DIAGNOSIS — R928 Other abnormal and inconclusive findings on diagnostic imaging of breast: Secondary | ICD-10-CM | POA: Diagnosis not present

## 2021-08-01 DIAGNOSIS — D241 Benign neoplasm of right breast: Secondary | ICD-10-CM | POA: Diagnosis not present

## 2021-08-01 HISTORY — PX: BREAST BIOPSY: SHX20

## 2021-08-01 MED ORDER — GADOBUTROL 1 MMOL/ML IV SOLN
10.0000 mL | Freq: Once | INTRAVENOUS | Status: AC | PRN
  Administered 2021-08-01: 10 mL via INTRAVENOUS

## 2021-08-01 NOTE — Progress Notes (Signed)
Patient had MRI biopsy completed. Will follow for path.  ? ?Oncology Nurse Navigator Documentation ? ? ?  08/01/2021  ?  9:00 AM  ?Oncology Nurse Navigator Flowsheets  ?Navigator Follow Up Date: 08/05/2021  ?Navigator Follow Up Reason: Pathology  ?Navigator Location [facility]  ?Navigator Encounter Type Appt/Treatment Plan Review  ?Patient Visit Type MedOnc  ?Treatment Phase Pre-Tx/Tx Discussion  ?Barriers/Navigation Needs Coordination of Care;Education  ?Interventions None Required  ?Acuity Level 2-Minimal Needs (1-2 Barriers Identified)  ?Support Groups/Services Friends and Family  ?Time Spent with Patient 15  ?  ?

## 2021-08-05 ENCOUNTER — Ambulatory Visit: Payer: Self-pay | Admitting: General Surgery

## 2021-08-05 ENCOUNTER — Encounter: Payer: Self-pay | Admitting: *Deleted

## 2021-08-05 DIAGNOSIS — C50312 Malignant neoplasm of lower-inner quadrant of left female breast: Secondary | ICD-10-CM

## 2021-08-05 NOTE — Progress Notes (Signed)
Patient's biopsy returned with benign pathology. She has received her cardiac clearance. She would like to proceed with mastectomy. Dr Marlou Starks has placed the request and sent to scheduling.  ? ? Oncology Nurse Navigator Documentation ? ? ?  08/05/2021  ?  2:00 PM  ?Oncology Nurse Navigator Flowsheets  ?Navigator Follow Up Date: 08/08/2021  ?Navigator Follow Up Reason: Appointment Review  ?Navigator Location [facility]  ?Navigator Encounter Type Pathology Review  ?Patient Visit Type MedOnc  ?Treatment Phase Pre-Tx/Tx Discussion  ?Barriers/Navigation Needs Coordination of Care;Education  ?Interventions None Required  ?Acuity Level 2-Minimal Needs (1-2 Barriers Identified)  ?Support Groups/Services Friends and Family  ?Time Spent with Patient 15  ?  ?

## 2021-08-08 ENCOUNTER — Encounter: Payer: Self-pay | Admitting: *Deleted

## 2021-08-08 NOTE — Progress Notes (Signed)
Patient scheduled for L mastectomy on 08/25/21.  Oncology Nurse Navigator Documentation     08/08/2021    9:15 AM  Oncology Nurse Navigator Flowsheets  Navigator Follow Up Date: 08/25/2021  Navigator Follow Up Reason: Surgery  Navigator Location [facility]  Navigator Encounter Type Appt/Treatment Plan Review  Patient Visit Type MedOnc  Treatment Phase Pre-Tx/Tx Discussion  Barriers/Navigation Needs Coordination of Care;Education  Interventions None Required  Acuity Level 2-Minimal Needs (1-2 Barriers Identified)  Support Groups/Services Friends and Family  Time Spent with Patient 15

## 2021-08-11 DIAGNOSIS — E1165 Type 2 diabetes mellitus with hyperglycemia: Secondary | ICD-10-CM | POA: Diagnosis not present

## 2021-08-11 DIAGNOSIS — I10 Essential (primary) hypertension: Secondary | ICD-10-CM | POA: Diagnosis not present

## 2021-08-19 ENCOUNTER — Encounter: Payer: Self-pay | Admitting: *Deleted

## 2021-08-20 ENCOUNTER — Encounter: Payer: Self-pay | Admitting: Neurology

## 2021-08-20 ENCOUNTER — Ambulatory Visit: Payer: Medicare HMO | Admitting: Neurology

## 2021-08-22 ENCOUNTER — Encounter (HOSPITAL_COMMUNITY): Payer: Self-pay | Admitting: General Surgery

## 2021-08-22 ENCOUNTER — Other Ambulatory Visit: Payer: Self-pay

## 2021-08-22 NOTE — Progress Notes (Signed)
Spoke with pt for pre-op call. Pt has hx of CAD and CHF. Pt has seen Dr. Agustin Cree on 07/23/21 and has cardiac clearance (in Epic). Pt denies any recent chest pain or shortness of breath. Pt is diabetic. Last A1C was 9.0 a couple of months ago per pt. She states her fasting blood sugar is usually around 154. Instructed pt to hold her Jardiance Sunday and Monday (DOS), hold her Glipizide Sunday PM and Monday. Instructed pt to take 1/2 of her regular dose of Levemir Sunday PM and Monday AM. She will take 10 units both times. Told pt not to take her Novolog Insulin the morning surgery. Instructed pt to check her blood sugar when she wakes Monday AM and every 2 hours until she leaves for the hospital. If blood sugar is 70 or below, treat with 1/2 cup of clear juice (apple or cranberry) and recheck blood sugar 15 minutes after drinking juice. If blood sugar continues to be 70 or below, call the Short Stay department and ask to speak to a nurse. Pt voiced understanding.  Shower instructions given to pt.

## 2021-08-25 ENCOUNTER — Encounter (HOSPITAL_COMMUNITY)
Admission: RE | Disposition: A | Discharge status: Home / Self Care | Payer: Self-pay | Source: Home / Self Care | Attending: General Surgery

## 2021-08-25 ENCOUNTER — Ambulatory Visit (HOSPITAL_COMMUNITY): Payer: Medicare HMO | Admitting: Anesthesiology

## 2021-08-25 ENCOUNTER — Encounter (HOSPITAL_COMMUNITY): Payer: Self-pay | Admitting: General Surgery

## 2021-08-25 ENCOUNTER — Ambulatory Visit (HOSPITAL_BASED_OUTPATIENT_CLINIC_OR_DEPARTMENT_OTHER): Payer: Medicare HMO | Admitting: Anesthesiology

## 2021-08-25 ENCOUNTER — Ambulatory Visit (HOSPITAL_COMMUNITY)
Admission: RE | Admit: 2021-08-25 | Discharge: 2021-08-26 | Disposition: A | Discharge status: Home / Self Care | Payer: Medicare HMO | Attending: General Surgery | Admitting: General Surgery

## 2021-08-25 ENCOUNTER — Other Ambulatory Visit: Payer: Self-pay

## 2021-08-25 ENCOUNTER — Encounter: Payer: Self-pay | Admitting: *Deleted

## 2021-08-25 DIAGNOSIS — C50912 Malignant neoplasm of unspecified site of left female breast: Secondary | ICD-10-CM | POA: Diagnosis not present

## 2021-08-25 DIAGNOSIS — Z833 Family history of diabetes mellitus: Secondary | ICD-10-CM | POA: Insufficient documentation

## 2021-08-25 DIAGNOSIS — Z794 Long term (current) use of insulin: Secondary | ICD-10-CM | POA: Insufficient documentation

## 2021-08-25 DIAGNOSIS — Z862 Personal history of diseases of the blood and blood-forming organs and certain disorders involving the immune mechanism: Secondary | ICD-10-CM | POA: Insufficient documentation

## 2021-08-25 DIAGNOSIS — D759 Disease of blood and blood-forming organs, unspecified: Secondary | ICD-10-CM | POA: Insufficient documentation

## 2021-08-25 DIAGNOSIS — I110 Hypertensive heart disease with heart failure: Secondary | ICD-10-CM

## 2021-08-25 DIAGNOSIS — R69 Illness, unspecified: Secondary | ICD-10-CM | POA: Diagnosis not present

## 2021-08-25 DIAGNOSIS — R222 Localized swelling, mass and lump, trunk: Secondary | ICD-10-CM | POA: Diagnosis not present

## 2021-08-25 DIAGNOSIS — L905 Scar conditions and fibrosis of skin: Secondary | ICD-10-CM | POA: Diagnosis not present

## 2021-08-25 DIAGNOSIS — Z6833 Body mass index (BMI) 33.0-33.9, adult: Secondary | ICD-10-CM | POA: Insufficient documentation

## 2021-08-25 DIAGNOSIS — E049 Nontoxic goiter, unspecified: Secondary | ICD-10-CM | POA: Insufficient documentation

## 2021-08-25 DIAGNOSIS — Z923 Personal history of irradiation: Secondary | ICD-10-CM | POA: Diagnosis not present

## 2021-08-25 DIAGNOSIS — Z8349 Family history of other endocrine, nutritional and metabolic diseases: Secondary | ICD-10-CM | POA: Insufficient documentation

## 2021-08-25 DIAGNOSIS — I509 Heart failure, unspecified: Secondary | ICD-10-CM

## 2021-08-25 DIAGNOSIS — Z79899 Other long term (current) drug therapy: Secondary | ICD-10-CM | POA: Insufficient documentation

## 2021-08-25 DIAGNOSIS — G8918 Other acute postprocedural pain: Secondary | ICD-10-CM | POA: Diagnosis not present

## 2021-08-25 DIAGNOSIS — L988 Other specified disorders of the skin and subcutaneous tissue: Secondary | ICD-10-CM | POA: Insufficient documentation

## 2021-08-25 DIAGNOSIS — E669 Obesity, unspecified: Secondary | ICD-10-CM | POA: Diagnosis not present

## 2021-08-25 DIAGNOSIS — F32A Depression, unspecified: Secondary | ICD-10-CM | POA: Insufficient documentation

## 2021-08-25 DIAGNOSIS — Z170 Estrogen receptor positive status [ER+]: Secondary | ICD-10-CM | POA: Insufficient documentation

## 2021-08-25 DIAGNOSIS — Z7984 Long term (current) use of oral hypoglycemic drugs: Secondary | ICD-10-CM | POA: Insufficient documentation

## 2021-08-25 DIAGNOSIS — I2510 Atherosclerotic heart disease of native coronary artery without angina pectoris: Secondary | ICD-10-CM

## 2021-08-25 DIAGNOSIS — D649 Anemia, unspecified: Secondary | ICD-10-CM | POA: Insufficient documentation

## 2021-08-25 DIAGNOSIS — Z853 Personal history of malignant neoplasm of breast: Secondary | ICD-10-CM | POA: Insufficient documentation

## 2021-08-25 DIAGNOSIS — Z8261 Family history of arthritis: Secondary | ICD-10-CM | POA: Insufficient documentation

## 2021-08-25 DIAGNOSIS — C50312 Malignant neoplasm of lower-inner quadrant of left female breast: Secondary | ICD-10-CM | POA: Insufficient documentation

## 2021-08-25 DIAGNOSIS — Z8249 Family history of ischemic heart disease and other diseases of the circulatory system: Secondary | ICD-10-CM | POA: Insufficient documentation

## 2021-08-25 DIAGNOSIS — E1165 Type 2 diabetes mellitus with hyperglycemia: Secondary | ICD-10-CM | POA: Diagnosis not present

## 2021-08-25 DIAGNOSIS — M1990 Unspecified osteoarthritis, unspecified site: Secondary | ICD-10-CM | POA: Insufficient documentation

## 2021-08-25 DIAGNOSIS — K219 Gastro-esophageal reflux disease without esophagitis: Secondary | ICD-10-CM | POA: Diagnosis not present

## 2021-08-25 DIAGNOSIS — M7989 Other specified soft tissue disorders: Secondary | ICD-10-CM | POA: Diagnosis not present

## 2021-08-25 HISTORY — DX: Personal history of urinary calculi: Z87.442

## 2021-08-25 HISTORY — PX: SIMPLE MASTECTOMY WITH AXILLARY SENTINEL NODE BIOPSY: SHX6098

## 2021-08-25 HISTORY — PX: LESION REMOVAL: SHX5196

## 2021-08-25 HISTORY — DX: Anemia, unspecified: D64.9

## 2021-08-25 LAB — POCT I-STAT, CHEM 8
BUN: 15 mg/dL (ref 8–23)
Calcium, Ion: 1.22 mmol/L (ref 1.15–1.40)
Chloride: 106 mmol/L (ref 98–111)
Creatinine, Ser: 0.8 mg/dL (ref 0.44–1.00)
Glucose, Bld: 177 mg/dL — ABNORMAL HIGH (ref 70–99)
HCT: 45 % (ref 36.0–46.0)
Hemoglobin: 15.3 g/dL — ABNORMAL HIGH (ref 12.0–15.0)
Potassium: 3.8 mmol/L (ref 3.5–5.1)
Sodium: 142 mmol/L (ref 135–145)
TCO2: 21 mmol/L — ABNORMAL LOW (ref 22–32)

## 2021-08-25 LAB — HEMOGLOBIN A1C
Hgb A1c MFr Bld: 10.2 % — ABNORMAL HIGH (ref 4.8–5.6)
Mean Plasma Glucose: 246.04 mg/dL

## 2021-08-25 LAB — GLUCOSE, CAPILLARY
Glucose-Capillary: 185 mg/dL — ABNORMAL HIGH (ref 70–99)
Glucose-Capillary: 167 mg/dL — ABNORMAL HIGH (ref 70–99)
Glucose-Capillary: 351 mg/dL — ABNORMAL HIGH (ref 70–99)
Glucose-Capillary: 392 mg/dL — ABNORMAL HIGH (ref 70–99)

## 2021-08-25 SURGERY — SIMPLE MASTECTOMY
Anesthesia: General | Site: Chest | Laterality: Left

## 2021-08-25 MED ORDER — PANTOPRAZOLE SODIUM 40 MG IV SOLR
40.0000 mg | Freq: Every day | INTRAVENOUS | Status: DC
  Administered 2021-08-25: 40 mg via INTRAVENOUS
  Filled 2021-08-25: qty 10

## 2021-08-25 MED ORDER — METHOCARBAMOL 500 MG PO TABS: 500.0000 mg | ORAL_TABLET | Freq: Four times a day (QID) | ORAL | Status: DC | PRN

## 2021-08-25 MED ORDER — PHENYLEPHRINE 80 MCG/ML (10ML) SYRINGE FOR IV PUSH (FOR BLOOD PRESSURE SUPPORT)
PREFILLED_SYRINGE | INTRAVENOUS | Status: AC
  Filled 2021-08-25: qty 10

## 2021-08-25 MED ORDER — CHLORHEXIDINE GLUCONATE CLOTH 2 % EX PADS
6.0000 | MEDICATED_PAD | Freq: Once | CUTANEOUS | Status: AC
  Administered 2021-08-25: 6 via TOPICAL

## 2021-08-25 MED ORDER — LACTATED RINGERS IV SOLN
INTRAVENOUS | Status: DC
Start: 2021-08-25 — End: 2021-08-25

## 2021-08-25 MED ORDER — FENTANYL CITRATE (PF) 250 MCG/5ML IJ SOLN
INTRAMUSCULAR | Status: AC
  Filled 2021-08-25: qty 5

## 2021-08-25 MED ORDER — ONDANSETRON HCL 4 MG/2ML IJ SOLN
INTRAMUSCULAR | Status: AC
  Filled 2021-08-25: qty 2

## 2021-08-25 MED ORDER — FENTANYL CITRATE (PF) 250 MCG/5ML IJ SOLN
INTRAMUSCULAR | Status: DC | PRN
  Administered 2021-08-25 (×2): 50 ug via INTRAVENOUS

## 2021-08-25 MED ORDER — CHLORHEXIDINE GLUCONATE CLOTH 2 % EX PADS: 6.0000 | MEDICATED_PAD | Freq: Once | CUTANEOUS | Status: DC

## 2021-08-25 MED ORDER — DEXAMETHASONE SODIUM PHOSPHATE 4 MG/ML IJ SOLN
INTRAMUSCULAR | Status: DC | PRN
  Administered 2021-08-25: 5 mg via PERINEURAL

## 2021-08-25 MED ORDER — BUPIVACAINE-EPINEPHRINE (PF) 0.25% -1:200000 IJ SOLN
INTRAMUSCULAR | Status: AC
  Filled 2021-08-25: qty 30

## 2021-08-25 MED ORDER — GLYCOPYRROLATE PF 0.2 MG/ML IJ SOSY
PREFILLED_SYRINGE | INTRAMUSCULAR | Status: DC | PRN
  Administered 2021-08-25: .2 mg via INTRAVENOUS

## 2021-08-25 MED ORDER — OXYCODONE HCL 5 MG PO TABS: 5.0000 mg | ORAL_TABLET | Freq: Once | ORAL | Status: DC | PRN

## 2021-08-25 MED ORDER — MORPHINE SULFATE (PF) 2 MG/ML IV SOLN
1.0000 mg | INTRAVENOUS | Status: DC | PRN
  Administered 2021-08-25: 2 mg via INTRAVENOUS
  Filled 2021-08-25 (×2): qty 1

## 2021-08-25 MED ORDER — MIDAZOLAM HCL 2 MG/2ML IJ SOLN
INTRAMUSCULAR | Status: AC
  Filled 2021-08-25: qty 2

## 2021-08-25 MED ORDER — GABAPENTIN 300 MG PO CAPS
300.0000 mg | ORAL_CAPSULE | ORAL | Status: AC
  Administered 2021-08-25: 300 mg via ORAL

## 2021-08-25 MED ORDER — GLYCOPYRROLATE PF 0.2 MG/ML IJ SOSY
PREFILLED_SYRINGE | INTRAMUSCULAR | Status: AC
  Filled 2021-08-25: qty 1

## 2021-08-25 MED ORDER — BUPIVACAINE-EPINEPHRINE (PF) 0.25% -1:200000 IJ SOLN
INTRAMUSCULAR | Status: DC | PRN
  Administered 2021-08-25: 10 mL via PERINEURAL

## 2021-08-25 MED ORDER — ROPIVACAINE HCL 5 MG/ML IJ SOLN
INTRAMUSCULAR | Status: DC | PRN
  Administered 2021-08-25: 30 mL via PERINEURAL

## 2021-08-25 MED ORDER — CEFAZOLIN SODIUM-DEXTROSE 2-4 GM/100ML-% IV SOLN
INTRAVENOUS | Status: AC
  Filled 2021-08-25: qty 100

## 2021-08-25 MED ORDER — HYDROCHLOROTHIAZIDE 25 MG PO TABS
25.0000 mg | ORAL_TABLET | Freq: Every day | ORAL | Status: DC
  Administered 2021-08-25 – 2021-08-26 (×2): 25 mg via ORAL
  Filled 2021-08-25 (×2): qty 1

## 2021-08-25 MED ORDER — CELECOXIB 200 MG PO CAPS
ORAL_CAPSULE | ORAL | Status: AC
  Filled 2021-08-25: qty 1

## 2021-08-25 MED ORDER — EMPAGLIFLOZIN 10 MG PO TABS
10.0000 mg | ORAL_TABLET | Freq: Every day | ORAL | Status: DC
  Administered 2021-08-26: 10 mg via ORAL
  Filled 2021-08-25 (×2): qty 1

## 2021-08-25 MED ORDER — ACETAMINOPHEN 500 MG PO TABS
ORAL_TABLET | ORAL | Status: AC
  Filled 2021-08-25: qty 2

## 2021-08-25 MED ORDER — CLONIDINE HCL (ANALGESIA) 100 MCG/ML EP SOLN
EPIDURAL | Status: DC | PRN
  Administered 2021-08-25: 80 ug

## 2021-08-25 MED ORDER — CHLORHEXIDINE GLUCONATE 0.12 % MT SOLN
15.0000 mL | Freq: Once | OROMUCOSAL | Status: AC
  Administered 2021-08-25: 15 mL via OROMUCOSAL

## 2021-08-25 MED ORDER — SODIUM CHLORIDE 0.9 % IV SOLN: INTRAVENOUS | Status: DC

## 2021-08-25 MED ORDER — GLIPIZIDE 5 MG PO TABS
10.0000 mg | ORAL_TABLET | Freq: Two times a day (BID) | ORAL | Status: DC
  Administered 2021-08-25 – 2021-08-26 (×2): 10 mg via ORAL
  Filled 2021-08-25 (×2): qty 2

## 2021-08-25 MED ORDER — DROPERIDOL 2.5 MG/ML IJ SOLN
0.6250 mg | Freq: Once | INTRAMUSCULAR | Status: DC | PRN
Start: 2021-08-25 — End: 2021-08-25

## 2021-08-25 MED ORDER — SUGAMMADEX SODIUM 200 MG/2ML IV SOLN
INTRAVENOUS | Status: DC | PRN
  Administered 2021-08-25 (×2): 200 mg via INTRAVENOUS

## 2021-08-25 MED ORDER — ONDANSETRON HCL 4 MG/2ML IJ SOLN
4.0000 mg | Freq: Four times a day (QID) | INTRAMUSCULAR | Status: DC | PRN
  Administered 2021-08-26: 4 mg via INTRAVENOUS
  Filled 2021-08-25: qty 2

## 2021-08-25 MED ORDER — 0.9 % SODIUM CHLORIDE (POUR BTL) OPTIME
TOPICAL | Status: DC | PRN
  Administered 2021-08-25: 1000 mL

## 2021-08-25 MED ORDER — ROCURONIUM BROMIDE 10 MG/ML (PF) SYRINGE
PREFILLED_SYRINGE | INTRAVENOUS | Status: DC | PRN
  Administered 2021-08-25: 60 mg via INTRAVENOUS

## 2021-08-25 MED ORDER — GABAPENTIN 300 MG PO CAPS
ORAL_CAPSULE | ORAL | Status: AC
  Filled 2021-08-25: qty 1

## 2021-08-25 MED ORDER — CHLORHEXIDINE GLUCONATE 0.12 % MT SOLN
OROMUCOSAL | Status: AC
  Filled 2021-08-25: qty 15

## 2021-08-25 MED ORDER — PROPOFOL 10 MG/ML IV BOLUS
INTRAVENOUS | Status: DC | PRN
  Administered 2021-08-25: 20 mg via INTRAVENOUS
  Administered 2021-08-25: 110 mg via INTRAVENOUS

## 2021-08-25 MED ORDER — ACETAMINOPHEN 500 MG PO TABS: 1000.0000 mg | ORAL_TABLET | Freq: Once | ORAL | Status: DC

## 2021-08-25 MED ORDER — ROPIVACAINE HCL 5 MG/ML IJ SOLN: INTRAMUSCULAR | Status: DC | PRN

## 2021-08-25 MED ORDER — FENTANYL CITRATE (PF) 100 MCG/2ML IJ SOLN
INTRAMUSCULAR | Status: AC
  Filled 2021-08-25: qty 2

## 2021-08-25 MED ORDER — INSULIN ASPART 100 UNIT/ML IJ SOLN
0.0000 [IU] | Freq: Three times a day (TID) | INTRAMUSCULAR | Status: DC
  Administered 2021-08-25: 9 [IU] via SUBCUTANEOUS
  Administered 2021-08-26: 3 [IU] via SUBCUTANEOUS

## 2021-08-25 MED ORDER — FENTANYL CITRATE (PF) 100 MCG/2ML IJ SOLN
25.0000 ug | INTRAMUSCULAR | Status: DC | PRN
  Administered 2021-08-25: 25 ug via INTRAVENOUS

## 2021-08-25 MED ORDER — OXYCODONE HCL 5 MG/5ML PO SOLN: 5.0000 mg | Freq: Once | ORAL | Status: DC | PRN

## 2021-08-25 MED ORDER — LIDOCAINE 2% (20 MG/ML) 5 ML SYRINGE
INTRAMUSCULAR | Status: DC | PRN
  Administered 2021-08-25: 80 mg via INTRAVENOUS

## 2021-08-25 MED ORDER — ROCURONIUM BROMIDE 10 MG/ML (PF) SYRINGE
PREFILLED_SYRINGE | INTRAVENOUS | Status: AC
  Filled 2021-08-25: qty 10

## 2021-08-25 MED ORDER — CEFAZOLIN SODIUM-DEXTROSE 2-4 GM/100ML-% IV SOLN
2.0000 g | INTRAVENOUS | Status: AC
  Administered 2021-08-25: 2 g via INTRAVENOUS

## 2021-08-25 MED ORDER — OXYCODONE HCL 5 MG PO TABS
5.0000 mg | ORAL_TABLET | ORAL | Status: DC | PRN
  Administered 2021-08-25 – 2021-08-26 (×2): 10 mg via ORAL
  Filled 2021-08-25 (×2): qty 2

## 2021-08-25 MED ORDER — ONDANSETRON 4 MG PO TBDP: 4.0000 mg | ORAL_TABLET | Freq: Four times a day (QID) | ORAL | Status: DC | PRN

## 2021-08-25 MED ORDER — CELECOXIB 200 MG PO CAPS
200.0000 mg | ORAL_CAPSULE | ORAL | Status: AC
  Administered 2021-08-25: 200 mg via ORAL

## 2021-08-25 MED ORDER — ONDANSETRON HCL 4 MG/2ML IJ SOLN
INTRAMUSCULAR | Status: DC | PRN
  Administered 2021-08-25: 4 mg via INTRAVENOUS

## 2021-08-25 MED ORDER — INSULIN ASPART 100 UNIT/ML IJ SOLN
INTRAMUSCULAR | Status: AC
  Filled 2021-08-25: qty 1

## 2021-08-25 MED ORDER — ENOXAPARIN SODIUM 40 MG/0.4ML IJ SOSY
40.0000 mg | PREFILLED_SYRINGE | INTRAMUSCULAR | Status: DC
  Administered 2021-08-26: 40 mg via SUBCUTANEOUS
  Filled 2021-08-25: qty 0.4

## 2021-08-25 MED ORDER — LIDOCAINE 2% (20 MG/ML) 5 ML SYRINGE
INTRAMUSCULAR | Status: AC
  Filled 2021-08-25: qty 5

## 2021-08-25 MED ORDER — GABAPENTIN 100 MG PO CAPS: 100.0000 mg | ORAL_CAPSULE | Freq: Two times a day (BID) | ORAL | Status: DC | PRN

## 2021-08-25 MED ORDER — INSULIN ASPART 100 UNIT/ML IJ SOLN
0.0000 [IU] | INTRAMUSCULAR | Status: DC | PRN
  Administered 2021-08-25: 2 [IU] via SUBCUTANEOUS

## 2021-08-25 MED ORDER — ORAL CARE MOUTH RINSE: 15.0000 mL | Freq: Once | OROMUCOSAL | Status: AC

## 2021-08-25 MED ORDER — ATORVASTATIN CALCIUM 40 MG PO TABS
40.0000 mg | ORAL_TABLET | Freq: Every day | ORAL | Status: DC
  Administered 2021-08-26: 40 mg via ORAL
  Filled 2021-08-25: qty 1

## 2021-08-25 MED ORDER — ACETAMINOPHEN 500 MG PO TABS
1000.0000 mg | ORAL_TABLET | ORAL | Status: AC
  Administered 2021-08-25: 1000 mg via ORAL

## 2021-08-25 MED ORDER — PHENYLEPHRINE 80 MCG/ML (10ML) SYRINGE FOR IV PUSH (FOR BLOOD PRESSURE SUPPORT)
PREFILLED_SYRINGE | INTRAVENOUS | Status: DC | PRN
  Administered 2021-08-25 (×2): 80 ug via INTRAVENOUS
  Administered 2021-08-25: 160 ug via INTRAVENOUS
  Administered 2021-08-25: 80 ug via INTRAVENOUS

## 2021-08-25 SURGICAL SUPPLY — 40 items
BAG COUNTER SPONGE SURGICOUNT (BAG) ×3 IMPLANT
BINDER BREAST LRG (GAUZE/BANDAGES/DRESSINGS) IMPLANT
BINDER BREAST XLRG (GAUZE/BANDAGES/DRESSINGS) ×1 IMPLANT
BIOPATCH RED 1 DISK 7.0 (GAUZE/BANDAGES/DRESSINGS) ×5 IMPLANT
CANISTER SUCT 3000ML PPV (MISCELLANEOUS) ×3 IMPLANT
CHLORAPREP W/TINT 26 (MISCELLANEOUS) ×3 IMPLANT
COVER SURGICAL LIGHT HANDLE (MISCELLANEOUS) ×3 IMPLANT
DERMABOND ADVANCED (GAUZE/BANDAGES/DRESSINGS) ×2
DERMABOND ADVANCED .7 DNX12 (GAUZE/BANDAGES/DRESSINGS) ×2 IMPLANT
DEVICE DSSCT PLSMBLD 3.0S LGHT (MISCELLANEOUS) ×2 IMPLANT
DRAIN CHANNEL 19F RND (DRAIN) ×3 IMPLANT
DRAPE LAPAROSCOPIC ABDOMINAL (DRAPES) ×3 IMPLANT
DRSG PAD ABDOMINAL 8X10 ST (GAUZE/BANDAGES/DRESSINGS) ×5 IMPLANT
DRSG TEGADERM 4X4.75 (GAUZE/BANDAGES/DRESSINGS) ×1 IMPLANT
ELECT REM PT RETURN 9FT ADLT (ELECTROSURGICAL) ×3
ELECTRODE REM PT RTRN 9FT ADLT (ELECTROSURGICAL) ×2 IMPLANT
EVACUATOR SILICONE 100CC (DRAIN) ×2 IMPLANT
GAUZE SPONGE 4X4 12PLY STRL (GAUZE/BANDAGES/DRESSINGS) ×3 IMPLANT
GLOVE BIO SURGEON STRL SZ7.5 (GLOVE) ×3 IMPLANT
GOWN STRL REUS W/ TWL LRG LVL3 (GOWN DISPOSABLE) ×4 IMPLANT
GOWN STRL REUS W/TWL LRG LVL3 (GOWN DISPOSABLE) ×2
KIT BASIN OR (CUSTOM PROCEDURE TRAY) ×3 IMPLANT
KIT TURNOVER KIT B (KITS) ×3 IMPLANT
LIGHT WAVEGUIDE WIDE FLAT (MISCELLANEOUS) IMPLANT
NDL HYPO 25GX1X1/2 BEV (NEEDLE) IMPLANT
NEEDLE HYPO 25GX1X1/2 BEV (NEEDLE) ×3 IMPLANT
NS IRRIG 1000ML POUR BTL (IV SOLUTION) ×3 IMPLANT
PACK GENERAL/GYN (CUSTOM PROCEDURE TRAY) ×3 IMPLANT
PAD ARMBOARD 7.5X6 YLW CONV (MISCELLANEOUS) ×3 IMPLANT
PLASMABLADE 3.0S W/LIGHT (MISCELLANEOUS) ×3
SUT ETHILON 3 0 FSL (SUTURE) ×3 IMPLANT
SUT MNCRL AB 4-0 PS2 18 (SUTURE) ×1 IMPLANT
SUT MON AB 4-0 PC3 18 (SUTURE) ×3 IMPLANT
SUT VIC AB 3-0 54X BRD REEL (SUTURE) IMPLANT
SUT VIC AB 3-0 BRD 54 (SUTURE)
SUT VIC AB 3-0 SH 18 (SUTURE) ×4 IMPLANT
SYR CONTROL 10ML LL (SYRINGE) ×1 IMPLANT
TOWEL GREEN STERILE (TOWEL DISPOSABLE) ×3 IMPLANT
TOWEL GREEN STERILE FF (TOWEL DISPOSABLE) ×3 IMPLANT
TUBE CONNECTING 12X1/4 (SUCTIONS) ×3 IMPLANT

## 2021-08-25 NOTE — Anesthesia Preprocedure Evaluation (Addendum)
Anesthesia Evaluation  Patient identified by MRN, date of birth, ID band Patient awake    Reviewed: Allergy & Precautions, NPO status , Patient's Chart, lab work & pertinent test results  Airway Mallampati: II  TM Distance: >3 FB Neck ROM: Full    Dental  (+) Dental Advisory Given, Teeth Intact   Pulmonary neg pulmonary ROS,    Pulmonary exam normal breath sounds clear to auscultation       Cardiovascular hypertension, Pt. on medications + CAD and +CHF  Normal cardiovascular exam Rhythm:Regular Rate:Normal  Echo 06/2021 1. Left ventricular ejection fraction, by estimation, is 60 to 65%. The left ventricle has normal function. The left ventricle has no regional wall motion abnormalities. There is moderate concentric left ventricular hypertrophy. Left ventricular diastolic parameters are consistent with Grade I diastolic dysfunction (impaired relaxation).  2. Right ventricular systolic function is normal. The right ventricular size is normal. Tricuspid regurgitation signal is inadequate for assessing PA pressure.  3. The mitral valve is abnormal. Trivial mitral valve regurgitation. Moderate mitral annular calcification.  4. The aortic valve is tricuspid. There is mild calcification of the aortic valve. There is mild thickening of the aortic valve. Aortic valve regurgitation is mild. Aortic valve sclerosis/calcification is present, without any evidence of aortic stenosis.  5. The inferior vena cava is normal in size with greater than 50% respiratory variability, suggesting right atrial pressure of 3 mmHg.    Neuro/Psych PSYCHIATRIC DISORDERS Depression negative neurological ROS     GI/Hepatic Neg liver ROS, GERD  ,  Endo/Other  diabetes, Poorly Controlled  Renal/GU Renal disease     Musculoskeletal  (+) Arthritis ,   Abdominal (+) + obese,   Peds  Hematology  (+) Blood dyscrasia, anemia ,   Anesthesia Other Findings    Reproductive/Obstetrics                           Anesthesia Physical Anesthesia Plan  ASA: 3  Anesthesia Plan: General   Post-op Pain Management: Tylenol PO (pre-op)*, Gabapentin PO (pre-op)* and Regional block*   Induction: Intravenous  PONV Risk Score and Plan: 3 and Ondansetron, Dexamethasone and Treatment may vary due to age or medical condition  Airway Management Planned: LMA and Oral ETT  Additional Equipment: None  Intra-op Plan:   Post-operative Plan: Extubation in OR  Informed Consent: I have reviewed the patients History and Physical, chart, labs and discussed the procedure including the risks, benefits and alternatives for the proposed anesthesia with the patient or authorized representative who has indicated his/her understanding and acceptance.     Dental advisory given  Plan Discussed with: CRNA  Anesthesia Plan Comments:       Anesthesia Quick Evaluation

## 2021-08-25 NOTE — H&P (Signed)
REFERRING PHYSICIAN: Allyson Sabal, *  PROVIDER: Landry Corporal, MD  MRN: [id number] DOB: [birth year] Subjective   Chief Complaint: left breast cancer   History of Present Illness: Bonnie Reed is a 76 y.o. female who is seen today as an office consultation for evaluation of left breast cancer .   We are asked to see the patient in consultation by Dr. Emmit Pomfret to evaluate her for a new left breast cancer. The patient is a 76 year old black female who was having some left breast pain. She was evaluated with CT and mammogram and ultrasound and found to have a 9 mm mass in the lower inner quadrant of the left breast. This was biopsied and came back as an invasive ductal cancer that was ER and PR positive with a Ki-67 of 15%. The HER2 is pending. She does have a history of left breast cancer treated with lumpectomy, radiation, for full axillary lymph node dissection, and tamoxifen back in 1997 in [place]. She does notice some swelling in the left arm. She has never seen physical therapy. She states that she has a history of thalassemia and congestive heart failure. She is followed by Dr. Jonette Eva at [facility]: A complete review of systems was obtained from the patient. I have reviewed this information and discussed as appropriate with the patient. See HPI as well for other ROS.  ROS   Medical History: Past Medical History:  Diagnosis Date   Abnormal nuclear stress test 10/25  [facility]:WFUX ischemia, mid and distal LAD, normal ef some mild distal anteror hypo   Anemia   Breast cancer (CMS-HCC)  sp lumpectomy and radiation   Depression   Diabetes mellitus type 2, uncomplicated (CMS-HCC)   Eczema, unspecified   Encounter for blood transfusion   Gastroesophageal reflux   Goiter  neck   Hx of cardiac cath  11/12:[facility]:Mild moderate mid distal LAD small vessel, on the small end for a stent.   Hypertension   Kidney stones   Osteoarthritis   Thyroid  goiter   Patient Active Problem List  Diagnosis   Osteoarthritis   Hypertension   Diabetes mellitus type 2, uncomplicated (CMS-HCC)   Depression   Encounter for blood transfusion   Kidney stones   Anemia   Eczema, unspecified   Thyroid goiter   Gastroesophageal reflux   Abnormal nuclear stress test   CAD (coronary artery disease)   Breast cancer (CMS-HCC)   Cervicalgia   Lumbago   Cervical spondylosis without myelopathy   Lumbosacral spondylosis without myelopathy   Hx of cardiac cath   Malignant neoplasm of lower-inner quadrant of left breast in female, estrogen receptor positive (CMS-HCC)   Enlarged thyroid   Past Surgical History:  Procedure Laterality Date   APPENDECTOMY 1956   CHOLECYSTECTOMY 1972   Cyst removed (R) wrist 2005   Pseudomonas (R) foot 2007   BREAST LUMPECTOMY   CESAREAN SECTION   right total hip replacement    Allergies  Allergen Reactions   Hay Fever & Allergy Relief [Chlorpheniramine-Phenylpropan] Itching   Current Outpatient Medications on File Prior to Visit  Medication Sig Dispense Refill   folic acid (FOLVITE) 1 MG tablet Take by mouth   glipiZIDE (GLUCOTROL) 10 MG tablet Take 10 mg by mouth 2 (two) times daily   hydroCHLOROthiazide (HYDRODIURIL) 25 MG tablet Take 25 mg by mouth once daily   insulin ASPART (NOVOLOG FLEXPEN U-100 INSULIN) pen injector (concentration 100 units/mL) Inject subcutaneously   insulin DETEMIR (LEVEMIR  FLEXPEN) pen injector (concentration 100 units/mL) Inject subcutaneously   cetirizine (ZYRTEC) 10 MG tablet Take 10 mg by mouth daily.   cholecalciferol, vitamin D3, (VITAMIN D3) 5,000 unit Tab Take 1 tablet by mouth daily.   ciprofloxacin (CIPRO) 500 MG tablet   esomeprazole magnesium (NEXIUM) 10 mg packet Take 10 mg by mouth continuously as needed.   FLUoxetine (PROZAC) 20 MG tablet Take 20 mg by mouth daily.   fluticasone propionate (FLONASE) 50 mcg/actuation nasal spray Place 2 sprays into both nostrils once  daily   HYDROcodone-acetaminophen (VICODIN) 5-500 mg tablet   JARDIANCE 10 mg tablet Take 10 mg by mouth every morning   metFORMIN (GLUCOPHAGE) 1000 MG tablet Take 1,000 mg by mouth 2 (two) times daily with meals.   valsartan (DIOVAN) 80 MG tablet Take 80 mg by mouth daily.   zolpidem (AMBIEN) 10 mg tablet Take 10 mg by mouth nightly as needed.   Current Facility-Administered Medications on File Prior to Visit  Medication Dose Route Frequency Provider Last Rate Last Admin   regadenoson (LEXISCAN) inj syringe 0.4 mg 0.4 mg Intravenous Once Caralyn Guile, MD   Family History  Problem Relation Age of Onset   Diabetes Mother 28  alive   Alzheimer's disease Mother   Stroke Mother   High blood pressure (Hypertension) Mother   Lung cancer Father 76  Deceased   Diabetes Sister   Depression Sister   Arthritis Sister   Asthma Sister   High blood pressure (Hypertension) Sister   Osteoporosis (Thinning of bones) Sister   Hyperthyroidism Child   Heart disease Other   Kidney disease Other   Osteoporosis (Thinning of bones) Other   Stroke Other   Arthritis Other   Cervical cancer Other   Diabetes Other   Myocardial Infarction (Heart attack) Other    Social History   Tobacco Use  Smoking Status Never  Smokeless Tobacco Not on file    Social History   Socioeconomic History   Marital status: Divorced  Tobacco Use   Smoking status: Never  Substance and Sexual Activity   Alcohol use: Yes  Comment: Occasional   Drug use: No   Objective:   Vitals:  BP: (!) 140/88  Pulse: 101  Temp: 37.1 C (98.8 F)  SpO2: 95%  Weight: 81 kg (178 lb 8 oz)  Height: 154.9 cm (_0 )   Body mass index is 33.73 kg/m.  Physical Exam Vitals reviewed.  Constitutional:  General: She is not in acute distress. Appearance: Normal appearance.  HENT:  Head: Normocephalic and atraumatic.  Right Ear: External ear normal.  Left Ear: External ear normal.  Nose: Nose normal.   Mouth/Throat:  Mouth: Mucous membranes are moist.  Pharynx: Oropharynx is clear.  Eyes:  General: No scleral icterus. Extraocular Movements: Extraocular movements intact.  Conjunctiva/sclera: Conjunctivae normal.  Pupils: Pupils are equal, round, and reactive to light.  Neck:  Comments: The patient feels that her thyroid gland is swollen Cardiovascular:  Rate and Rhythm: Normal rate and regular rhythm.  Pulses: Normal pulses.  Heart sounds: Normal heart sounds.  Pulmonary:  Effort: Pulmonary effort is normal. No respiratory distress.  Breath sounds: Normal breath sounds.  Abdominal:  General: Bowel sounds are normal.  Palpations: Abdomen is soft.  Tenderness: There is no abdominal tenderness.  Musculoskeletal:  General: No swelling, tenderness or deformity. Normal range of motion.  Cervical back: Normal range of motion and neck supple.  Comments: There is swelling of the left arm  Skin: General: Skin is  warm and dry.  Coloration: Skin is not jaundiced.  Neurological:  General: No focal deficit present.  Mental Status: She is alert and oriented to person, place, and time.  Psychiatric:  Mood and Affect: Mood normal.  Behavior: Behavior normal.     Breast: There is a lateral scar on the left breast. The left breast is contracted and thickened from radiation. There is no obvious palpable mass in the left breast. There is evidence of a complete axillary lymph node dissection and swelling of the left arm. There is no palpable mass in the right breast. There is no palpable right axillary or supraclavicular lymphadenopathy.  Labs, Imaging and Diagnostic Testing:  Assessment and Plan:   Diagnoses and all orders for this visit:  Malignant neoplasm of lower-inner quadrant of left breast in female, estrogen receptor positive (CMS-HCC) - Ambulatory Referral to Oncology-Medical - Ambulatory Referral to Physical Therapy - Ambulatory Referral to Radiation Oncology - Ambulatory  Referral to Cardiology - [facility] Case Posting Request; Future  Enlarged thyroid - US soft tissue head and neck; Future    The patient appears to have a small cancer in the lower inner quadrant of the left breast. She has had a previous lymph node dissection with no evidence of disease in the left axilla by ultrasound. I have talked to her about the different options for treatment and given her history of radiation my recommendation would be for mastectomy. If she has significant medical issues and is not a great candidate for surgery then it might be possible to do a lumpectomy with antiestrogens but her medical oncologist would need to be on board with this. She will need cardiac clearance so we will refer her to [facility] cardiology. She also needs evaluation by physical therapy given the lymphedema findings in the left arm. I will also order an ultrasound of her thyroid gland. Once she has been evaluated by medical oncology and cardiology then we can start with surgical planning.

## 2021-08-25 NOTE — Op Note (Signed)
08/25/2021  12:04 PM  PATIENT:  Bonnie Reed  76 y.o. female  PRE-OPERATIVE DIAGNOSIS:  LEFT BREAST CANCER  POST-OPERATIVE DIAGNOSIS:  LEFT BREAST CANCER  PROCEDURE:  Procedure(s): LEFT MASTECTOMY (Left) EXCISION SKIN LESION LEFT CHEST WALL (Left)  SURGEON:  Surgeon(s) and Role:    * Jovita Kussmaul, MD - Primary  PHYSICIAN ASSISTANT:   ASSISTANTS: Dr. Eustace Moore   ANESTHESIA:   local and general  EBL:  10 mL   BLOOD ADMINISTERED:none  DRAINS: (1) Blake drain(s) in the prepectoral space left    LOCAL MEDICATIONS USED:  MARCAINE     SPECIMEN:  Source of Specimen:  left chest wall skin lesion, left mastectomy  DISPOSITION OF SPECIMEN:  PATHOLOGY  COUNTS:  YES  TOURNIQUET:  * No tourniquets in log *  DICTATION: .Dragon Dictation  After informed consent was obtained the patient was brought to the operating room and placed in the supine position on the operating table.  After adequate induction of general anesthesia the patient's left chest, breast, and axillary area were prepped with ChloraPrep, allowed to dry, and draped in usual sterile manner.  An appropriate timeout was performed.  Initially attention was turned to the skin lesion on the upper left chest wall.  The area around this was infiltrated with quarter percent Marcaine.  An elliptical incision was made around the skin lesion with a 15 blade knife.  The incision was carried through the skin and subcutaneous tissue sharply with the PlasmaBlade.  Once the entire specimen was removed it was sent to pathology for further evaluation.  The specimen measured 1 x 2 cm.  Hemostasis was achieved using the PlasmaBlade.  The incision was then closed with interrupted 4-0 Monocryl subcuticular stitches.  Attention was then turned to the left breast.  An elliptical incision was made around the nipple and areolar complex in order to minimize the excess skin.  The incision was carried through the skin and subcutaneous tissue sharply with the  PlasmaBlade.  Breast hooks were used to elevate the skin flaps anteriorly towards the ceiling.  Thin skin flaps were then created by dissecting between the breast tissue and the subcutaneous fat and skin.  This dissection was carried all the way to the chest wall circumferentially.  The breast tissue was noted to be very fibrotic and friable from previous radiation.  Next the breast was removed from the pectoralis muscle with the pectoralis fascia.  Once this was accomplished the entire left breast was removed from the patient.  It was oriented with a stitch on the lateral skin and sent to pathology for further evaluation.  A couple of small perforating vessels medially were controlled with figure-of-eight 3-0 Vicryl stitches.  The wound was then irrigated with copious amounts of saline.  Hemostasis was achieved using the PlasmaBlade.  A small stab incision was made near the anterior axillary line inferior to the operative bed.  A hemostat was placed through this opening and used to bring a 19 [place] round Blake drain into the operative bed.  The drain was curled along the chest wall.  The drain was anchored to the skin with a 3-0 nylon stitch.  Next the superior and inferior skin flaps were grossly reapproximated with interrupted 3-0 Vicryl stitches.  The skin was then closed with a running 4-0 Monocryl subcuticular stitch.  Dermabond dressings were applied.  The patient tolerated the procedure well.  At the end of the case all needle sponge and instrument counts were correct.  The  patient was then awakened and taken to recovery in stable condition.  PLAN OF CARE: Admit for overnight observation  PATIENT DISPOSITION:  PACU - hemodynamically stable.   Delay start of Pharmacological VTE agent (>24hrs) due to surgical blood loss or risk of bleeding: no

## 2021-08-25 NOTE — Progress Notes (Signed)
Patient had her L mastectomy today. Will follow for path results.   Oncology Nurse Navigator Documentation     08/25/2021    1:30 PM  Oncology Nurse Navigator Flowsheets  Planned Course of Treatment Surgery  Phase of Treatment Surgery  Surgery Actual Start Date: 08/25/2021  Navigator Follow Up Date: 08/29/2021  Navigator Follow Up Reason: Pathology  Navigator Location [facility]  Navigator Encounter Type Appt/Treatment Plan Review  Treatment Initiated Date 08/29/2021  Patient Visit Type MedOnc  Treatment Phase Active Tx  Barriers/Navigation Needs Coordination of Care;Education  Interventions None Required  Acuity Level 2-Minimal Needs (1-2 Barriers Identified)  Support Groups/Services Friends and Family  Time Spent with Patient 15

## 2021-08-25 NOTE — Anesthesia Procedure Notes (Signed)
Anesthesia Regional Block: Pectoralis block   Pre-Anesthetic Checklist: , timeout performed,  Correct Patient, Correct Site, Correct Laterality,  Correct Procedure, Correct Position, site marked,  Risks and benefits discussed,  Surgical consent,  Pre-op evaluation,  At surgeon's request and post-op pain management  Laterality: Left  Prep: chloraprep       Needles:   Needle Type: Stimiplex     Needle Length: 9cm      Additional Needles:   Procedures:,,,, ultrasound used (permanent image in chart),,    Narrative:  Start time: 08/25/2021 10:00 AM End time: 08/25/2021 10:20 AM Injection made incrementally with aspirations every 5 mL.  Performed by: Personally  Anesthesiologist: Nolon Nations, MD  Additional Notes: BP cuff, SpO2 and EKG monitors applied. Sedation begun.  Anesthetic injected incrementally, slowly, and after neg aspirations under direct ultrasound guidance. Good fascial spread noted. Patient tolerated well.

## 2021-08-25 NOTE — Interval H&P Note (Signed)
History and Physical Interval Note:  08/25/2021 9:28 AM  Bonnie Reed  has presented today for surgery, with the diagnosis of LEFT BREAST CANCER.  The various methods of treatment have been discussed with the patient and family. After consideration of risks, benefits and other options for treatment, the patient has consented to  Procedure(s): LEFT MASTECTOMY (Left) as a surgical intervention.  The patient's history has been reviewed, patient examined, no change in status, stable for surgery.  I have reviewed the patient's chart and labs.  Questions were answered to the patient's satisfaction.     [name]

## 2021-08-25 NOTE — Anesthesia Procedure Notes (Signed)
Procedure Name: Intubation Date/Time: 08/25/2021 10:48 AM Performed by: Cathren Harsh, CRNA Pre-anesthesia Checklist: Patient identified, Emergency Drugs available, Suction available and Patient being monitored Patient Re-evaluated:Patient Re-evaluated prior to induction Oxygen Delivery Method: Circle System Utilized Preoxygenation: Pre-oxygenation with 100% oxygen Induction Type: IV induction Ventilation: Mask ventilation without difficulty and Oral airway inserted - appropriate to patient size Laryngoscope Size: Mac and 3 Grade View: Grade III Tube type: Oral Tube size: 7.0 mm Number of attempts: 1 Airway Equipment and Method: Stylet and Oral airway Placement Confirmation: ETT inserted through vocal cords under direct vision, positive ETCO2 and breath sounds checked- equal and bilateral Secured at: 21 cm Tube secured with: Tape Dental Injury: Teeth and Oropharynx as per pre-operative assessment

## 2021-08-25 NOTE — Transfer of Care (Signed)
Immediate Anesthesia Transfer of Care Note  Patient: Bonnie Reed  Procedure(s) Performed: LEFT MASTECTOMY (Left: Breast) EXCISION SKIN LESION LEFT CHEST WALL (Left: Chest)  Patient Location: PACU  Anesthesia Type:General  Level of Consciousness: awake, drowsy, patient cooperative and responds to stimulation  Airway & Oxygen Therapy: Patient Spontanous Breathing and Patient connected to nasal cannula oxygen  Post-op Assessment: Report given to RN and Post -op Vital signs reviewed and stable  Post vital signs: Reviewed and stable  Last Vitals:  Vitals Value Taken Time  BP 145/61 08/25/21 1214  Temp    Pulse 81 08/25/21 1215  Resp 17 08/25/21 1215  SpO2 97 % 08/25/21 1215  Vitals shown include unvalidated device data.  Last Pain:  Vitals:   08/25/21 1020  TempSrc:   PainSc: 0-No pain      Patients Stated Pain Goal: 1 (69/79/48 0165)  Complications: No notable events documented.

## 2021-08-26 ENCOUNTER — Encounter (HOSPITAL_COMMUNITY): Payer: Self-pay | Admitting: General Surgery

## 2021-08-26 ENCOUNTER — Inpatient Hospital Stay: Payer: Medicare HMO | Admitting: Family

## 2021-08-26 ENCOUNTER — Inpatient Hospital Stay: Payer: Medicare HMO

## 2021-08-26 DIAGNOSIS — Z853 Personal history of malignant neoplasm of breast: Secondary | ICD-10-CM | POA: Diagnosis not present

## 2021-08-26 DIAGNOSIS — M7989 Other specified soft tissue disorders: Secondary | ICD-10-CM | POA: Diagnosis not present

## 2021-08-26 DIAGNOSIS — C50312 Malignant neoplasm of lower-inner quadrant of left female breast: Secondary | ICD-10-CM | POA: Diagnosis not present

## 2021-08-26 DIAGNOSIS — Z794 Long term (current) use of insulin: Secondary | ICD-10-CM | POA: Diagnosis not present

## 2021-08-26 DIAGNOSIS — Z79899 Other long term (current) drug therapy: Secondary | ICD-10-CM | POA: Diagnosis not present

## 2021-08-26 DIAGNOSIS — E1165 Type 2 diabetes mellitus with hyperglycemia: Secondary | ICD-10-CM | POA: Diagnosis not present

## 2021-08-26 DIAGNOSIS — E049 Nontoxic goiter, unspecified: Secondary | ICD-10-CM | POA: Diagnosis not present

## 2021-08-26 DIAGNOSIS — Z8261 Family history of arthritis: Secondary | ICD-10-CM | POA: Diagnosis not present

## 2021-08-26 DIAGNOSIS — Z833 Family history of diabetes mellitus: Secondary | ICD-10-CM | POA: Diagnosis not present

## 2021-08-26 DIAGNOSIS — E669 Obesity, unspecified: Secondary | ICD-10-CM | POA: Diagnosis not present

## 2021-08-26 DIAGNOSIS — M1990 Unspecified osteoarthritis, unspecified site: Secondary | ICD-10-CM | POA: Diagnosis not present

## 2021-08-26 DIAGNOSIS — Z170 Estrogen receptor positive status [ER+]: Secondary | ICD-10-CM | POA: Diagnosis not present

## 2021-08-26 DIAGNOSIS — Z6833 Body mass index (BMI) 33.0-33.9, adult: Secondary | ICD-10-CM | POA: Diagnosis not present

## 2021-08-26 DIAGNOSIS — I509 Heart failure, unspecified: Secondary | ICD-10-CM | POA: Diagnosis not present

## 2021-08-26 DIAGNOSIS — Z8249 Family history of ischemic heart disease and other diseases of the circulatory system: Secondary | ICD-10-CM | POA: Diagnosis not present

## 2021-08-26 DIAGNOSIS — R69 Illness, unspecified: Secondary | ICD-10-CM | POA: Diagnosis not present

## 2021-08-26 DIAGNOSIS — K219 Gastro-esophageal reflux disease without esophagitis: Secondary | ICD-10-CM | POA: Diagnosis not present

## 2021-08-26 DIAGNOSIS — I2510 Atherosclerotic heart disease of native coronary artery without angina pectoris: Secondary | ICD-10-CM | POA: Diagnosis not present

## 2021-08-26 DIAGNOSIS — Z923 Personal history of irradiation: Secondary | ICD-10-CM | POA: Diagnosis not present

## 2021-08-26 DIAGNOSIS — Z862 Personal history of diseases of the blood and blood-forming organs and certain disorders involving the immune mechanism: Secondary | ICD-10-CM | POA: Diagnosis not present

## 2021-08-26 DIAGNOSIS — I110 Hypertensive heart disease with heart failure: Secondary | ICD-10-CM | POA: Diagnosis not present

## 2021-08-26 DIAGNOSIS — Z8349 Family history of other endocrine, nutritional and metabolic diseases: Secondary | ICD-10-CM | POA: Diagnosis not present

## 2021-08-26 DIAGNOSIS — L905 Scar conditions and fibrosis of skin: Secondary | ICD-10-CM | POA: Diagnosis not present

## 2021-08-26 DIAGNOSIS — Z7984 Long term (current) use of oral hypoglycemic drugs: Secondary | ICD-10-CM | POA: Diagnosis not present

## 2021-08-26 LAB — GLUCOSE, CAPILLARY
Glucose-Capillary: 213 mg/dL — ABNORMAL HIGH (ref 70–99)
Glucose-Capillary: 335 mg/dL — ABNORMAL HIGH (ref 70–99)

## 2021-08-26 MED ORDER — OXYCODONE HCL 5 MG PO TABS: 5.0000 mg | ORAL_TABLET | Freq: Four times a day (QID) | ORAL | 0 refills | Status: DC | PRN

## 2021-08-26 NOTE — Anesthesia Postprocedure Evaluation (Signed)
Anesthesia Post Note  Patient: Bonnie L Mantell  Procedure(s) Performed: LEFT MASTECTOMY (Left: Breast) EXCISION SKIN LESION LEFT CHEST WALL (Left: Chest)     Patient location during evaluation: PACU Anesthesia Type: General Level of consciousness: sedated and patient cooperative Pain management: pain level controlled Vital Signs Assessment: post-procedure vital signs reviewed and stable Respiratory status: spontaneous breathing Cardiovascular status: stable Anesthetic complications: no   No notable events documented.  Last Vitals:  Vitals:   08/25/21 2135 08/26/21 0002  BP: (!) 145/55 125/61  Pulse: 73 65  Resp: 17   Temp: 36.7 C 37 C  SpO2: 95% 93%    Last Pain:  Vitals:   08/26/21 0649  TempSrc:   PainSc: [place]

## 2021-08-26 NOTE — Progress Notes (Signed)
1 Day Post-Op   Subjective/Chief Complaint: No complaints other than some soreness   Objective: Vital signs in last 24 hours: Temp:  [97.8 F (36.6 C)-98.9 F (37.2 C)] 98.9 F (37.2 C) (06/06 0809) Pulse Rate:  [63-81] 63 (06/06 0809) Resp:  [14-20] 17 (06/06 0809) BP: (125-166)/(55-78) 139/71 (06/06 0809) SpO2:  [90 %-99 %] 94 % (06/06 0809) Weight:  [79.4 kg] 79.4 kg (06/05 0917) Last BM Date : 08/24/21  Intake/Output from previous day: 06/05 0701 - 06/06 0700 In: 1700 [P.O.:600; I.V.:1100] Out: 885 [Urine:800; Drains:75; Blood:10] Intake/Output this shift: No intake/output data recorded.  General appearance: alert and cooperative Resp: clear to auscultation bilaterally Chest wall: skin flaps look good Cardio: regular rate and rhythm GI: soft, non-tender; bowel sounds normal; no masses,  no organomegaly  Lab Results:  Recent Labs    08/25/21 0951  HGB 15.3*  HCT 45.0   BMET Recent Labs    08/25/21 0951  NA 142  K 3.8  CL 106  GLUCOSE 177*  BUN 15  CREATININE 0.80   PT/INR No results for input(s): LABPROT, INR in the last 72 hours. ABG No results for input(s): PHART, HCO3 in the last 72 hours.  Invalid input(s): PCO2, PO2  Studies/Results: No results found.  Anti-infectives: Anti-infectives (From admission, onward)    Start     Dose/Rate Route Frequency Ordered Stop   08/25/21 0949  ceFAZolin (ANCEF) 2-4 GM/100ML-% IVPB       Note to Pharmacy: Gleason, Ginger E: cabinet override      08/25/21 0949 08/25/21 2159   08/25/21 0945  ceFAZolin (ANCEF) IVPB 2g/100 mL premix        2 g 200 mL/hr over 30 Minutes Intravenous On call to O.R. 08/25/21 0941 08/25/21 1051       Assessment/Plan: s/p Procedure(s): LEFT MASTECTOMY (Left) EXCISION SKIN LESION LEFT CHEST WALL (Left) Advance diet Discharge Teach pt drain care  LOS: 0 days    [name] 08/26/2021

## 2021-08-26 NOTE — Progress Notes (Signed)
Discharge instructions given to pt. Pt verbalized understanding of all teaching. Patient taught how to empty/record JP drain output and verbalized that she would be able to do it at home. Extra empty canister given for discharge.

## 2021-08-26 NOTE — Progress Notes (Signed)
Patient discharged to home with daughter via wheel chair with all belongings. 

## 2021-08-26 NOTE — Plan of Care (Signed)
  Problem: Education: Goal: Ability to describe self-care measures that may prevent or decrease complications (Diabetes Survival Skills Education) will improve Outcome: Progressing Goal: Individualized Educational Video(s) Outcome: Progressing   Problem: Coping: Goal: Ability to adjust to condition or change in health will improve Outcome: Progressing   Problem: Fluid Volume: Goal: Ability to maintain a balanced intake and output will improve Outcome: Progressing   Problem: Health Behavior/Discharge Planning: Goal: Ability to identify and utilize available resources and services will improve Outcome: Progressing Goal: Ability to manage health-related needs will improve Outcome: Progressing   Problem: Metabolic: Goal: Ability to maintain appropriate glucose levels will improve Outcome: Progressing   Problem: Nutritional: Goal: Maintenance of adequate nutrition will improve Outcome: Progressing Goal: Progress toward achieving an optimal weight will improve Outcome: Progressing   Problem: Skin Integrity: Goal: Risk for impaired skin integrity will decrease Outcome: Progressing   Problem: Education: Goal: Knowledge of General Education information will improve Description: Including pain rating scale, medication(s)/side effects and non-pharmacologic comfort measures Outcome: Progressing   Problem: Health Behavior/Discharge Planning: Goal: Ability to manage health-related needs will improve Outcome: Progressing   Problem: Clinical Measurements: Goal: Ability to maintain clinical measurements within normal limits will improve Outcome: Progressing Goal: Will remain free from infection Outcome: Progressing Goal: Diagnostic test results will improve Outcome: Progressing Goal: Respiratory complications will improve Outcome: Progressing Goal: Cardiovascular complication will be avoided Outcome: Progressing   Problem: Activity: Goal: Risk for activity intolerance will  decrease Outcome: Progressing   Problem: Nutrition: Goal: Adequate nutrition will be maintained Outcome: Progressing   Problem: Pain Managment: Goal: General experience of comfort will improve Outcome: Progressing   Problem: Safety: Goal: Ability to remain free from injury will improve Outcome: Progressing

## 2021-08-27 LAB — SURGICAL PATHOLOGY

## 2021-08-29 ENCOUNTER — Encounter: Payer: Self-pay | Admitting: *Deleted

## 2021-08-29 NOTE — Progress Notes (Signed)
Per Dr Marin Olp, request for Oncotype Score testing sent on specimen [id number] DOS 08/25/2021.  Patient also needs follow up appointment for 3-4 weeks from now. Appointment scheduled for 09/24/21.  Called and spoke to patient. She is doing well after surgery. She has had minimal pain and feels like she progressing as expected. She had a few questions about her recovery period which I answered to her satisfaction. She is aware of her follow up with Dr Marin Olp.   Oncology Nurse Navigator Documentation     08/29/2021    8:15 AM  Oncology Nurse Navigator Flowsheets  Navigator Follow Up Date: 09/24/2021  Navigator Follow Up Reason: Follow-up Appointment  Navigator Location [facility]  Navigator Encounter Type Molecular Studies;Appt/Treatment Plan Review  Telephone Patient Update;Outgoing Call  Patient Visit Type MedOnc  Treatment Phase Active Tx  Barriers/Navigation Needs Coordination of Care;Education  Education Other  Interventions Coordination of Care;Education;Psycho-Social Support  Acuity Level 2-Minimal Needs (1-2 Barriers Identified)  Coordination of Care Appts  Education Method Verbal  Support Groups/Services Friends and Family  Time Spent with Patient 86

## 2021-09-05 DIAGNOSIS — Z170 Estrogen receptor positive status [ER+]: Secondary | ICD-10-CM | POA: Diagnosis not present

## 2021-09-05 DIAGNOSIS — C50312 Malignant neoplasm of lower-inner quadrant of left female breast: Secondary | ICD-10-CM | POA: Diagnosis not present

## 2021-09-09 ENCOUNTER — Encounter: Payer: Self-pay | Admitting: *Deleted

## 2021-09-09 NOTE — Progress Notes (Signed)
Oncotype Recurrence Score resulted at 3. Report given to Dr Marin Olp.   Oncology Nurse Navigator Documentation     09/09/2021    8:15 AM  Oncology Nurse Navigator Flowsheets  Navigator Follow Up Date: 09/24/2021  Navigator Follow Up Reason: Follow-up Appointment  Navigator Location [facility]  Navigator Encounter Type Pathology Review  Patient Visit Type MedOnc  Treatment Phase Active Tx  Barriers/Navigation Needs Coordination of Care;Education  Interventions None Required  Acuity Level 2-Minimal Needs (1-2 Barriers Identified)  Support Groups/Services Friends and Family  Time Spent with Patient 15

## 2021-09-22 ENCOUNTER — Other Ambulatory Visit: Payer: Self-pay

## 2021-09-22 DIAGNOSIS — N6311 Unspecified lump in the right breast, upper outer quadrant: Secondary | ICD-10-CM

## 2021-09-24 ENCOUNTER — Encounter: Payer: Self-pay | Admitting: Hematology & Oncology

## 2021-09-24 ENCOUNTER — Inpatient Hospital Stay: Payer: Medicare HMO | Attending: Gastroenterology

## 2021-09-24 ENCOUNTER — Inpatient Hospital Stay: Payer: Medicare HMO | Admitting: Hematology & Oncology

## 2021-09-24 VITALS — BP 139/64 | HR 85 | Temp 98.2°F | Resp 17 | Wt 176.0 lb

## 2021-09-24 DIAGNOSIS — M158 Other polyosteoarthritis: Secondary | ICD-10-CM | POA: Diagnosis not present

## 2021-09-24 DIAGNOSIS — C50312 Malignant neoplasm of lower-inner quadrant of left female breast: Secondary | ICD-10-CM

## 2021-09-24 DIAGNOSIS — M8580 Other specified disorders of bone density and structure, unspecified site: Secondary | ICD-10-CM | POA: Diagnosis not present

## 2021-09-24 DIAGNOSIS — C50912 Malignant neoplasm of unspecified site of left female breast: Secondary | ICD-10-CM | POA: Insufficient documentation

## 2021-09-24 DIAGNOSIS — D563 Thalassemia minor: Secondary | ICD-10-CM | POA: Insufficient documentation

## 2021-09-24 DIAGNOSIS — N6311 Unspecified lump in the right breast, upper outer quadrant: Secondary | ICD-10-CM

## 2021-09-24 DIAGNOSIS — Z170 Estrogen receptor positive status [ER+]: Secondary | ICD-10-CM | POA: Diagnosis not present

## 2021-09-24 LAB — CMP (CANCER CENTER ONLY)
ALT: 14 U/L (ref 0–44)
AST: 15 U/L (ref 15–41)
Albumin: 4.4 g/dL (ref 3.5–5.0)
Alkaline Phosphatase: 239 U/L — ABNORMAL HIGH (ref 38–126)
Anion gap: 10 (ref 5–15)
BUN: 17 mg/dL (ref 8–23)
CO2: 23 mmol/L (ref 22–32)
Calcium: 9.7 mg/dL (ref 8.9–10.3)
Chloride: 106 mmol/L (ref 98–111)
Creatinine: 1.18 mg/dL — ABNORMAL HIGH (ref 0.44–1.00)
GFR, Estimated: 48 mL/min — ABNORMAL LOW (ref >60)
Glucose, Bld: 214 mg/dL — ABNORMAL HIGH (ref 70–99)
Potassium: 3.9 mmol/L (ref 3.5–5.1)
Sodium: 139 mmol/L (ref 135–145)
Total Bilirubin: 0.3 mg/dL (ref 0.3–1.2)
Total Protein: 7.4 g/dL (ref 6.5–8.1)

## 2021-09-24 LAB — CBC WITH DIFFERENTIAL (CANCER CENTER ONLY)
Abs Immature Granulocytes: 0.03 10*3/uL (ref 0.00–0.07)
Basophils Absolute: 0.1 10*3/uL (ref 0.0–0.1)
Basophils Relative: 1 %
Eosinophils Absolute: 0.3 10*3/uL (ref 0.0–0.5)
Eosinophils Relative: 4 %
HCT: 42.9 % (ref 36.0–46.0)
Hemoglobin: 12.8 g/dL (ref 12.0–15.0)
Immature Granulocytes: 0 %
Lymphocytes Relative: 29 %
Lymphs Abs: 2.6 10*3/uL (ref 0.7–4.0)
MCH: 21.8 pg — ABNORMAL LOW (ref 26.0–34.0)
MCHC: 29.8 g/dL — ABNORMAL LOW (ref 30.0–36.0)
MCV: 73.1 fL — ABNORMAL LOW (ref 80.0–100.0)
Monocytes Absolute: 1 10*3/uL (ref 0.1–1.0)
Monocytes Relative: 11 %
Neutro Abs: 5 10*3/uL (ref 1.7–7.7)
Neutrophils Relative %: 55 %
Platelet Count: 285 10*3/uL (ref 150–400)
RBC: 5.87 MIL/uL — ABNORMAL HIGH (ref 3.87–5.11)
RDW: 17.4 % — ABNORMAL HIGH (ref 11.5–15.5)
WBC Count: 9 10*3/uL (ref 4.0–10.5)
nRBC: 0 % (ref 0.0–0.2)

## 2021-09-24 LAB — RETICULOCYTES
Immature Retic Fract: 13.4 % (ref 2.3–15.9)
RBC.: 5.76 MIL/uL — ABNORMAL HIGH (ref 3.87–5.11)
Retic Count, Absolute: 107.1 10*3/uL (ref 19.0–186.0)
Retic Ct Pct: 1.9 % (ref 0.4–3.1)

## 2021-09-24 LAB — FERRITIN: Ferritin: 118 ng/mL (ref 11–307)

## 2021-09-24 LAB — SAVE SMEAR(SSMR), FOR PROVIDER SLIDE REVIEW

## 2021-09-24 LAB — LACTATE DEHYDROGENASE: LDH: 135 U/L (ref 98–192)

## 2021-09-24 MED ORDER — LETROZOLE 2.5 MG PO TABS: 2.5000 mg | ORAL_TABLET | Freq: Every day | ORAL | 12 refills | Status: DC

## 2021-09-24 NOTE — Progress Notes (Signed)
Hematology and Oncology Follow Up Visit  Bonnie Reed [id number] 12/23/1945 76 y.o. 09/24/2021   Principle Diagnosis:  Invasive ductal carcinoma of the LEFT breast -- Stage 1 (T1cNxM0) --ER+/HER2- -- Oncotype = 3 Alpha thalassemia minor trait  Current Therapy:  S/P LEFT MRM on 45/36/4680  Folic acid 1 mg PO daily   Interim History:  Bonnie Reed is here today for follow-up.  She did have a left modified radical mastectomy.  This is for the breast cancer of the left breast.  She had a prior history of breast cancer the left breast probably 25 years ago.  The pathology report ([id number]) showed an invasive ductal carcinoma that was 1.2 cm.  All margins were negative.  The grade was 2.  No lymph nodes were taken.  As such, she was a stage I ductal carcinoma.  Thankfully, the tumor had a very low Oncotype score of 3.  She looks great.  She has little bit of swelling in the left upper abdomen.  I do not know if this might have been from the mastectomy.  She has had no problems with cough or shortness of breath.  She has had no nausea or vomiting.  There is no change in bowel or bladder habits.  Overall, I would have to say that her performance status right now is ECOG 1.    Medications:  Allergies as of 09/24/2021       Reactions   Phenylpropanol Other (See Comments)   Unknown/cannot remember        Medication List        Accurate as of September 24, 2021 12:24 PM. If you have any questions, ask your nurse or doctor.          Accu-Chek Softclix Lancets lancets Use to check blood sugar three times daily or as directed. What changed:  how much to take how to take this when to take this   aspirin EC 81 MG tablet Take 81 mg by mouth daily.   atorvastatin 40 MG tablet Commonly known as: LIPITOR Take 40 mg by mouth daily.   cetirizine 10 MG tablet Commonly known as: ZYRTEC Take 10 mg by mouth daily as needed for allergies.   cholecalciferol 25 MCG (1000 UNIT)  tablet Commonly known as: VITAMIN D3 Take 1,000 Units by mouth daily.   fluticasone 50 MCG/ACT nasal spray Commonly known as: FLONASE Place 2 sprays into both nostrils daily as needed for allergies.   folic acid 1 MG tablet Commonly known as: FOLVITE Take 1 tablet (1 mg total) by mouth daily.   gabapentin 100 MG capsule Commonly known as: NEURONTIN Take 100 mg by mouth 2 (two) times daily as needed (pain).   GENTEAL OP Place 1 drop into both eyes daily as needed (dry eyes).   glipiZIDE 10 MG tablet Commonly known as: GLUCOTROL Take 10 mg by mouth 2 (two) times daily.   GlucoCom Blood Glucose Monitor Devi 1 each by Other route daily.   hydrochlorothiazide 25 MG tablet Commonly known as: HYDRODIURIL Take 25 mg by mouth daily.   ibuprofen 200 MG tablet Commonly known as: ADVIL Take 200 mg by mouth every 6 (six) hours as needed for moderate pain.   Jardiance 10 MG Tabs tablet Generic drug: empagliflozin Take 1 tablet by mouth once daily   Levemir FlexTouch 100 UNIT/ML FlexPen Generic drug: insulin detemir Inject 35 Units into the skin 2 (two) times daily.   Linzess 145 MCG Caps capsule Generic drug: linaclotide Take 145  mcg by mouth daily as needed (constipation).   NovoLOG FlexPen 100 UNIT/ML FlexPen Generic drug: insulin aspart Inject 18 Units into the skin 3 (three) times daily with meals.   ONE TOUCH DELICA LANCING DEV Misc Use to test 3 times a day Dx:E11.9 What changed:  how much to take how to take this when to take this   OneTouch Verio test strip Generic drug: glucose blood Use to test 3 times a day Dx:E11.9 What changed:  how much to take how to take this when to take this   oxyCODONE 5 MG immediate release tablet Commonly known as: Oxy IR/ROXICODONE Take 1-2 tablets (5-10 mg total) by mouth every 6 (six) hours as needed for moderate pain.   valsartan 160 MG tablet Commonly known as: DIOVAN Take 1 tablet by mouth once daily         Allergies:  Allergies  Allergen Reactions   Phenylpropanol Other (See Comments)    Unknown/cannot remember    Past Medical History, Surgical history, Social history, and Family History were reviewed and updated.  Review of Systems: Review of Systems  Constitutional: Negative.   HENT: Negative.  Negative for hearing loss.   Eyes: Negative.   Respiratory: Negative.    Cardiovascular: Negative.   Gastrointestinal: Negative.   Genitourinary: Negative.   Musculoskeletal: Negative.   Skin: Negative.   Neurological: Negative.   Endo/Heme/Allergies: Negative.   Psychiatric/Behavioral: Negative.       Physical Exam:  weight is 176 lb (79.8 kg). Her oral temperature is 98.2 F (36.8 C). Her blood pressure is 139/64 and her pulse is 85. Her respiration is 17 and oxygen saturation is 100%.   Wt Readings from Last 3 Encounters:  09/24/21 176 lb (79.8 kg)  08/25/21 175 lb (79.4 kg)  07/24/21 178 lb (80.7 kg)   Physical Exam Vitals reviewed.  Constitutional:      Comments: Breast exam shows right breast with no masses, edema or erythema.  There is no right axillary adenopathy.  Left chest wall does show the left mastectomy.  This is healing.  There is no erythema or swelling.  There may be little bit of tenderness at the lateral aspect of the mastectomy.  There is no left axillary lymph nodes.    HENT:     Head: Normocephalic and atraumatic.  Eyes:     Pupils: Pupils are equal, round, and reactive to light.  Cardiovascular:     Rate and Rhythm: Normal rate and regular rhythm.     Heart sounds: Normal heart sounds.     Comments: Cardiac exam shows regular rate and rhythm with normal S1 and S2.  I do not hear any murmurs, rubs or bruits. Pulmonary:     Effort: Pulmonary effort is normal.     Breath sounds: Normal breath sounds.  Abdominal:     General: Bowel sounds are normal.     Palpations: Abdomen is soft.  Musculoskeletal:        General: No tenderness or deformity.  Normal range of motion.     Cervical back: Normal range of motion.     Comments: Extremity shows some mild chronic swelling in the left arm.  There is no swelling in the legs.  Lymphadenopathy:     Cervical: No cervical adenopathy.  Skin:    General: Skin is warm and dry.     Findings: No erythema or rash.  Neurological:     Mental Status: She is alert and oriented to person, place, and  time.  Psychiatric:        Behavior: Behavior normal.        Thought Content: Thought content normal.        Judgment: Judgment normal.      Lab Results  Component Value Date   WBC 9.0 09/24/2021   HGB 12.8 09/24/2021   HCT 42.9 09/24/2021   MCV 73.1 (L) 09/24/2021   PLT 285 09/24/2021   Lab Results  Component Value Date   FERRITIN 158 02/25/2021   IRON 90 02/25/2021   TIBC 388 02/25/2021   UIBC 297 02/25/2021   IRONPCTSAT 23 02/25/2021   Lab Results  Component Value Date   RETICCTPCT 1.9 09/24/2021   RBC 5.76 (H) 09/24/2021   No results found for: "KPAFRELGTCHN", "LAMBDASER", "KAPLAMBRATIO" No results found for: "IGGSERUM", "IGA", "IGMSERUM" No results found for: "TOTALPROTELP", "ALBUMINELP", "A1GS", "A2GS", "BETS", "BETA2SER", "GAMS", "MSPIKE", "SPEI"   Chemistry      Component Value Date/Time   NA 139 09/24/2021 1129   NA 141 02/08/2019 1049   K 3.9 09/24/2021 1129   CL 106 09/24/2021 1129   CO2 23 09/24/2021 1129   BUN 17 09/24/2021 1129   BUN 15 02/08/2019 1049   CREATININE 1.18 (H) 09/24/2021 1129      Component Value Date/Time   CALCIUM 9.7 09/24/2021 1129   ALKPHOS 239 (H) 09/24/2021 1129   AST 15 09/24/2021 1129   ALT 14 09/24/2021 1129   BILITOT 0.3 09/24/2021 1129       Impression and Plan: Ms. Leicht is a very pleasant 76 yo African American female with alpha thalassemia minor trait.  She has remote history of breast cancer of the left breast.  She now has another breast cancer.  Thankfully, this is early stage.  Even more important is the fact that it is  estrogen positive and HER2 negative.  There is a very low Oncotype score.  She will be placed on Femara.  I think this is very reasonable.  I think this should be quite helpful to help prevent the tumor from coming back over in the right breast and also in the body.  I would like to get a bone density test on her.  The last one was 3 years ago.  I think if she does have osteopenia, we should consider her for Prolia.  I will plan to get her back in 6 weeks.  If all looks good in 6 weeks, then we will plan to get her back in 3 months.  I am just happy that she is done so well.  She had no cardiac issues after surgery.  She definitely is very tough.   Volanda Napoleon, MD 7/5/202312:24 PM

## 2021-09-25 ENCOUNTER — Encounter: Payer: Self-pay | Admitting: *Deleted

## 2021-09-25 LAB — IRON AND IRON BINDING CAPACITY (CC-WL,HP ONLY)
Iron: 85 ug/dL (ref 28–170)
Saturation Ratios: 20 % (ref 10.4–31.8)
TIBC: 417 ug/dL (ref 250–450)
UIBC: 332 ug/dL (ref 148–442)

## 2021-09-25 NOTE — Progress Notes (Signed)
Patient will start AI. Prescription sent.   Oncology Nurse Navigator Documentation     09/25/2021    8:45 AM  Oncology Nurse Navigator Flowsheets  Planned Course of Treatment AI  Phase of Treatment AI  Aromatase Inhibitor Actual Start Date: 09/24/2021  Navigator Follow Up Date: 11/05/2021  Navigator Follow Up Reason: Follow-up Appointment  Navigator Location [facility]  Navigator Encounter Type Appt/Treatment Plan Review  Patient Visit Type MedOnc  Treatment Phase Active Tx  Barriers/Navigation Needs Coordination of Care;Education  Interventions None Required  Acuity Level 2-Minimal Needs (1-2 Barriers Identified)  Support Groups/Services Friends and Family  Time Spent with Patient 15

## 2021-09-30 ENCOUNTER — Ambulatory Visit (HOSPITAL_BASED_OUTPATIENT_CLINIC_OR_DEPARTMENT_OTHER)
Admission: RE | Admit: 2021-09-30 | Discharge: 2021-09-30 | Disposition: A | Discharge status: Home / Self Care | Payer: Medicare HMO | Source: Ambulatory Visit | Attending: Hematology & Oncology | Admitting: Hematology & Oncology

## 2021-09-30 ENCOUNTER — Other Ambulatory Visit: Payer: Self-pay | Admitting: Hematology & Oncology

## 2021-09-30 ENCOUNTER — Encounter: Payer: Self-pay | Admitting: Hematology & Oncology

## 2021-09-30 DIAGNOSIS — E119 Type 2 diabetes mellitus without complications: Secondary | ICD-10-CM | POA: Insufficient documentation

## 2021-09-30 DIAGNOSIS — M85832 Other specified disorders of bone density and structure, left forearm: Secondary | ICD-10-CM | POA: Diagnosis not present

## 2021-09-30 DIAGNOSIS — Z13820 Encounter for screening for osteoporosis: Secondary | ICD-10-CM | POA: Diagnosis not present

## 2021-09-30 DIAGNOSIS — M8580 Other specified disorders of bone density and structure, unspecified site: Secondary | ICD-10-CM | POA: Insufficient documentation

## 2021-09-30 DIAGNOSIS — M85852 Other specified disorders of bone density and structure, left thigh: Secondary | ICD-10-CM | POA: Diagnosis not present

## 2021-09-30 HISTORY — DX: Other specified disorders of bone density and structure, unspecified site: M85.80

## 2021-10-01 ENCOUNTER — Telehealth: Payer: Self-pay

## 2021-10-01 NOTE — Telephone Encounter (Signed)
Advised via MyChart.

## 2021-10-01 NOTE — Telephone Encounter (Signed)
-----   Message from Volanda Napoleon, MD sent at 09/30/2021  6:10 PM EDT ----- Call and let her know that the bone density test shows some osteopenia.  She clearly needs Prolia.  We can get her on Prolia when I see her back in 6 weeks.  Please make sure this is scheduled.  Thanks.  Laurey Arrow

## 2021-10-07 DIAGNOSIS — E1165 Type 2 diabetes mellitus with hyperglycemia: Secondary | ICD-10-CM | POA: Diagnosis not present

## 2021-10-07 DIAGNOSIS — E782 Mixed hyperlipidemia: Secondary | ICD-10-CM | POA: Diagnosis not present

## 2021-10-07 DIAGNOSIS — I10 Essential (primary) hypertension: Secondary | ICD-10-CM | POA: Diagnosis not present

## 2021-10-07 DIAGNOSIS — I2510 Atherosclerotic heart disease of native coronary artery without angina pectoris: Secondary | ICD-10-CM | POA: Diagnosis not present

## 2021-10-13 ENCOUNTER — Telehealth: Payer: Self-pay

## 2021-10-13 NOTE — Telephone Encounter (Signed)
Pt called in stating that she started the Femara on 09/24/21 and since then she has had headaches and body aches.  Per Dr Marin Olp she can start taking Vitamin D 50,000 units once a week to help with these side effects. Pt advised and states understanding.

## 2021-10-17 ENCOUNTER — Telehealth: Payer: Self-pay | Admitting: Internal Medicine

## 2021-10-17 NOTE — Telephone Encounter (Signed)
Pt called stating she would like to establish care with Dr. Nani Ravens if possible.  Is Care establishment ok for this pt?

## 2021-10-17 NOTE — Telephone Encounter (Signed)
I'm limiting new patients at this time. Sorry.

## 2021-10-22 NOTE — Telephone Encounter (Signed)
Pt was called and advised of decision. Pt stated she had scheduled a NP appt with Vance Peper and that would pursue care with her.

## 2021-10-24 ENCOUNTER — Ambulatory Visit (INDEPENDENT_AMBULATORY_CARE_PROVIDER_SITE_OTHER): Payer: Medicare HMO | Admitting: Nurse Practitioner

## 2021-10-24 ENCOUNTER — Encounter: Payer: Self-pay | Admitting: Nurse Practitioner

## 2021-10-24 VITALS — BP 130/70 | HR 78 | Temp 97.0°F | Wt 177.8 lb

## 2021-10-24 DIAGNOSIS — Z794 Long term (current) use of insulin: Secondary | ICD-10-CM

## 2021-10-24 DIAGNOSIS — Z170 Estrogen receptor positive status [ER+]: Secondary | ICD-10-CM | POA: Diagnosis not present

## 2021-10-24 DIAGNOSIS — M1711 Unilateral primary osteoarthritis, right knee: Secondary | ICD-10-CM

## 2021-10-24 DIAGNOSIS — E118 Type 2 diabetes mellitus with unspecified complications: Secondary | ICD-10-CM | POA: Diagnosis not present

## 2021-10-24 DIAGNOSIS — I509 Heart failure, unspecified: Secondary | ICD-10-CM

## 2021-10-24 DIAGNOSIS — I10 Essential (primary) hypertension: Secondary | ICD-10-CM | POA: Diagnosis not present

## 2021-10-24 DIAGNOSIS — C50312 Malignant neoplasm of lower-inner quadrant of left female breast: Secondary | ICD-10-CM | POA: Diagnosis not present

## 2021-10-24 MED ORDER — OZEMPIC (0.25 OR 0.5 MG/DOSE) 2 MG/1.5ML ~~LOC~~ SOPN: 0.2500 mg | PEN_INJECTOR | SUBCUTANEOUS | 1 refills | Status: DC

## 2021-10-24 MED ORDER — VALSARTAN 80 MG PO TABS: 80.0000 mg | ORAL_TABLET | Freq: Every day | ORAL | 1 refills | Status: DC

## 2021-10-24 MED ORDER — NOVOLOG FLEXPEN 100 UNIT/ML ~~LOC~~ SOPN: 15.0000 [IU] | PEN_INJECTOR | Freq: Three times a day (TID) | SUBCUTANEOUS | 1 refills | Status: DC

## 2021-10-24 NOTE — Assessment & Plan Note (Signed)
Chronic, stable.  She is euvolemic on exam today.  She is currently following with cardiology.  Continue collaboration recommendations from cardiology.

## 2021-10-24 NOTE — Assessment & Plan Note (Signed)
Chronic, stable.  BP today is 130/70.  Continue valsartan 80 mg daily.  Most recent BMP and CBC reviewed.  Follow-up in 6 months.

## 2021-10-24 NOTE — Assessment & Plan Note (Signed)
She has a history of left-sided breast cancer that was diagnosed in April.  She had a mastectomy done in June and is having trouble with fluid accumulation at the site.  She states that she has had this drained twice and if she needs to have it done again, they will insert a drain.  She is currently following with general surgery and oncology.  Continue recommendations from specialist.

## 2021-10-24 NOTE — Assessment & Plan Note (Signed)
Chronic, not controlled.  Her last A1c on 08/25/2021 was 10.2%.  We will have her stop taking the glipizide.  Continue Jardiance 10 mg daily, Levemir 35 units twice a day, and NovoLog 15 units twice a day with meals.  We will also start her on Ozempic 0.25 milligram weekly injection for 4 weeks, then increase to 0.5 mg injection weekly.  Discussed possible side effects.  Encouraged her to continue checking her blood sugar at home.  She has an appointment with an ophthalmologist next week.  She can also continue her atorvastatin 40 mg daily.  Foot exam normal today.  Discussed nutrition.  Follow-up in 4 weeks.

## 2021-10-24 NOTE — Patient Instructions (Signed)
It was great to see you!  Stop your glipizide. Start ozempic injection once a week. Start with 0.'25mg'$  for 4 weeks, then increase to 0.'5mg'$  injection weekly.   Let's follow-up in 4 weeks, sooner if you have concerns.  If a referral was placed today, you will be contacted for an appointment. Please note that routine referrals can sometimes take up to 3-4 weeks to process. Please call our office if you haven't heard anything after this time frame.  Take care,  Vance Peper, NP

## 2021-10-24 NOTE — Assessment & Plan Note (Signed)
Chronic, stable.  She takes gabapentin 100 mg twice a day as needed for pain.  We will continue this regimen.

## 2021-10-24 NOTE — Progress Notes (Signed)
New Patient Visit  BP 130/70 (BP Location: Right Arm, Cuff Size: Large)   Pulse 78   Temp (!) 97 F (36.1 C) (Temporal)   Wt 177 lb 12.8 oz (80.6 kg)   LMP 10/17/1991 (Exact Date)   SpO2 97%   BMI 33.60 kg/m    Subjective:    Patient ID: Bonnie Reed, female    DOB: 08-Jan-1946, 76 y.o.   MRN: [id number]  CC: Chief Complaint  Patient presents with   Establish Care    Np. Est care. DM management w/ elevated BS range 384-102. Pt c/o having issues with sleeping at night.     HPI: Bonnie Reed is a 76 y.o. female presents for new patient visit to establish care.  Introduced to Designer, jewellery role and practice setting.  All questions answered.  Discussed provider/patient relationship and expectations.  She has a history of breast cancer. She had surgery in June 2023 with mastectomy. She has been having trouble since then with fluid accumulation at her surgery site she had this drained twice.  She states that if she still has fluid building up she may need a drain.  She is following with oncology and general surgery.  This is her second bout with breast cancer, she had breast cancer 25 years ago.  She is taking letrozole daily.  She states that she did not need chemo or radiation this time.  She also has a history of diabetes and states that her sugars are not controlled.  Her sugars range from 100 to the 300s fasting.  She is currently taking glipizide 10 mg twice a day, Jardiance 10 mg daily, Levemir 35 units twice a day and NovoLog 15 units with meals, which is usually twice a day.  She has noticed some numbness and tingling in her fingers and some blurry vision.  She sees an eye doctor regularly and has an appointment next week.  She denies chest pain, shortness of breath.  She has a history of arthritis in her knees, she had an arthroscopy done on her right knee.  She takes gabapentin 100 mg twice a day as needed for pain.  She states that this is stable right now.  Depression  Screen done:     10/24/2021    1:04 PM 11/22/2019    3:00 PM 06/21/2019    9:43 AM 03/02/2019   11:25 AM 02/08/2019    8:45 AM  Depression screen PHQ 2/9  Decreased Interest 0 0 0 0 0  Down, Depressed, Hopeless 2 0 0 1 0  PHQ - 2 Score 2 0 0 1 0  Altered sleeping 0      Tired, decreased energy 0      Change in appetite 0      Feeling bad or failure about yourself  0      Trouble concentrating 0      Moving slowly or fidgety/restless 0      Suicidal thoughts 0      PHQ-9 Score 2        Past Medical History:  Diagnosis Date   Allergy    Anemia    during knee replacement 15 years ago   Blood transfusion without reported diagnosis    Breast cancer ([place]) 1997   Left Breast Cancer   Calculus of kidney    Cancer ([place])    Cervical spondylosis    CHF (congestive heart failure) (HCC)    Coronary artery disease involving native coronary artery of  native heart without angina pectoris    COVID 04/2021   mild case   Diabetes mellitus without complication (HCC)    Diverticulosis    GERD (gastroesophageal reflux disease)    without esophagitis   History of breast cancer    History of kidney stones    Hyperlipidemia    Hypertension    Lumbar spondylosis    Osteoarthritis    right knee   Osteopenia due to cancer therapy 09/30/2021   Other eczema    Personal history of radiation therapy 1997   Left Breast Cancer   Recurrent major depressive disorder ([place])    Thyroid goiter     Past Surgical History:  Procedure Laterality Date   APPENDECTOMY     BREAST BIOPSY Right 08/01/2021   BREAST LUMPECTOMY Left 1997   CESAREAN SECTION     CHOLECYSTECTOMY     KNEE ARTHROSCOPY Right    LESION REMOVAL Left 08/25/2021   Procedure: EXCISION SKIN LESION LEFT CHEST WALL;  Surgeon: Jovita Kussmaul, MD;  Location: [facility];  Service: General;  Laterality: Left;   REDUCTION MAMMAPLASTY Right 1998   SIMPLE MASTECTOMY WITH AXILLARY SENTINEL NODE BIOPSY Left 08/25/2021   Procedure: LEFT MASTECTOMY;   Surgeon: Jovita Kussmaul, MD;  Location: [facility];  Service: General;  Laterality: Left;   TOTAL HIP ARTHROPLASTY Right    TUBAL LIGATION      Family History  Problem Relation Age of Onset   Stroke Mother    Kidney disease Mother    Dementia Mother    Cancer Father        lung   Colon cancer Neg Hx    Rectal cancer Neg Hx      Current Outpatient Medications on File Prior to Visit  Medication Sig Dispense Refill   ACCU-CHEK SOFTCLIX LANCETS lancets Use to check blood sugar three times daily or as directed. (Patient taking differently: 1 each by Other route See admin instructions. Use to check blood sugar three times daily or as directed.) 100 each 12   aspirin EC 81 MG tablet Take 81 mg by mouth daily.     atorvastatin (LIPITOR) 40 MG tablet Take 40 mg by mouth daily.     Blood Glucose Monitoring Suppl (GLUCOCOM BLOOD GLUCOSE MONITOR) DEVI 1 each by Other route daily.     Carboxymethylcell-Hypromellose (GENTEAL OP) Place 1 drop into both eyes daily as needed (dry eyes).     cetirizine (ZYRTEC) 10 MG tablet Take 10 mg by mouth daily as needed for allergies.     cholecalciferol (VITAMIN D3) 25 MCG (1000 UNIT) tablet Take 1,000 Units by mouth daily.     fluticasone (FLONASE) 50 MCG/ACT nasal spray Place 2 sprays into both nostrils daily as needed for allergies.     folic acid (FOLVITE) 1 MG tablet Take 1 tablet (1 mg total) by mouth daily. 30 tablet 11   gabapentin (NEURONTIN) 100 MG capsule Take 100 mg by mouth 2 (two) times daily as needed (pain).     glucose blood (ONETOUCH VERIO) test strip Use to test 3 times a day Dx:E11.9 (Patient taking differently: 1 each by Other route See admin instructions. Use to test 3 times a day Dx:E11.9) 100 each 12   ibuprofen (ADVIL) 200 MG tablet Take 200 mg by mouth every 6 (six) hours as needed for moderate pain.     JARDIANCE 10 MG TABS tablet Take 1 tablet by mouth once daily 90 tablet 0   Lancet Devices (ONE TOUCH  DELICA LANCING DEV) MISC Use to test 3  times a day Dx:E11.9 (Patient taking differently: 1 each by Other route See admin instructions. Use to test 3 times a day Dx:E11.9) 1 each 0   letrozole (FEMARA) 2.5 MG tablet Take 1 tablet (2.5 mg total) by mouth daily. 30 tablet 12   LEVEMIR FLEXTOUCH 100 UNIT/ML FlexTouch Pen Inject 35 Units into the skin 2 (two) times daily.     LINZESS 145 MCG CAPS capsule Take 145 mcg by mouth daily as needed (constipation).     RELION PEN NEEDLES 31G X 6 MM MISC USE 1 THREE TIMES DAILY     No current facility-administered medications on file prior to visit.     Review of Systems  Constitutional:  Positive for fatigue. Negative for fever.  HENT: Negative.    Eyes:  Positive for visual disturbance (blurry). Negative for pain and redness.  Respiratory: Negative.    Cardiovascular: Negative.   Gastrointestinal: Negative.   Genitourinary:  Positive for frequency. Negative for dysuria.  Musculoskeletal:  Positive for arthralgias (chronic right knee).  Skin: Negative.   Neurological: Negative.   Psychiatric/Behavioral: Negative.        Objective:    BP 130/70 (BP Location: Right Arm, Cuff Size: Large)   Pulse 78   Temp (!) 97 F (36.1 C) (Temporal)   Wt 177 lb 12.8 oz (80.6 kg)   LMP 10/17/1991 (Exact Date)   SpO2 97%   BMI 33.60 kg/m   Wt Readings from Last 3 Encounters:  10/24/21 177 lb 12.8 oz (80.6 kg)  09/24/21 176 lb (79.8 kg)  08/25/21 175 lb (79.4 kg)    BP Readings from Last 3 Encounters:  10/24/21 130/70  09/24/21 139/64  08/26/21 139/71    Physical Exam Vitals and nursing note reviewed.  Constitutional:      General: She is not in acute distress.    Appearance: Normal appearance.  HENT:     Head: Normocephalic.  Eyes:     Conjunctiva/sclera: Conjunctivae normal.  Cardiovascular:     Rate and Rhythm: Normal rate and regular rhythm.     Pulses: Normal pulses.     Heart sounds: Normal heart sounds.  Pulmonary:     Effort: Pulmonary effort is normal.     Breath  sounds: Normal breath sounds.  Abdominal:     Palpations: Abdomen is soft.     Tenderness: There is no abdominal tenderness.  Musculoskeletal:     Cervical back: Normal range of motion.     Right lower leg: No edema.     Left lower leg: No edema.  Skin:    General: Skin is warm.  Neurological:     General: No focal deficit present.     Mental Status: She is alert and oriented to person, place, and time.  Psychiatric:        Mood and Affect: Mood normal.        Behavior: Behavior normal.        Thought Content: Thought content normal.        Judgment: Judgment normal.    Diabetic Foot Exam - Simple   Simple Foot Form Visual Inspection No deformities, no ulcerations, no other skin breakdown bilaterally: Yes Sensation Testing Intact to touch and monofilament testing bilaterally: Yes Pulse Check Posterior Tibialis and Dorsalis pulse intact bilaterally: Yes Comments       Assessment & Plan:   Problem List Items Addressed This Visit       Cardiovascular and  Mediastinum   Hypertension    Chronic, stable.  BP today is 130/70.  Continue valsartan 80 mg daily.  Most recent BMP and CBC reviewed.  Follow-up in 6 months.      Relevant Medications   valsartan (DIOVAN) 80 MG tablet   CHF (congestive heart failure) (HCC) - Primary    Chronic, stable.  She is euvolemic on exam today.  She is currently following with cardiology.  Continue collaboration recommendations from cardiology.      Relevant Medications   valsartan (DIOVAN) 80 MG tablet     Endocrine   Type 2 diabetes mellitus with complication, with long-term current use of insulin (HCC)    Chronic, not controlled.  Her last A1c on 08/25/2021 was 10.2%.  We will have her stop taking the glipizide.  Continue Jardiance 10 mg daily, Levemir 35 units twice a day, and NovoLog 15 units twice a day with meals.  We will also start her on Ozempic 0.25 milligram weekly injection for 4 weeks, then increase to 0.5 mg injection weekly.   Discussed possible side effects.  Encouraged her to continue checking her blood sugar at home.  She has an appointment with an ophthalmologist next week.  She can also continue her atorvastatin 40 mg daily.  Foot exam normal today.  Discussed nutrition.  Follow-up in 4 weeks.      Relevant Medications   valsartan (DIOVAN) 80 MG tablet   Semaglutide,0.25 or 0.'5MG'$ /DOS, (OZEMPIC, 0.25 OR 0.5 MG/DOSE,) 2 MG/1.5ML SOPN   NOVOLOG FLEXPEN 100 UNIT/ML FlexPen     Musculoskeletal and Integument   Primary osteoarthritis of right knee    Chronic, stable.  She takes gabapentin 100 mg twice a day as needed for pain.  We will continue this regimen.        Other   Malignant neoplasm of lower-inner quadrant of left breast in female, estrogen receptor positive ([place])    She has a history of left-sided breast cancer that was diagnosed in April.  She had a mastectomy done in June and is having trouble with fluid accumulation at the site.  She states that she has had this drained twice and if she needs to have it done again, they will insert a drain.  She is currently following with general surgery and oncology.  Continue recommendations from specialist.        Follow up plan: Return in about 4 weeks (around 11/21/2021) for Diabetes.

## 2021-10-29 ENCOUNTER — Encounter: Payer: Self-pay | Admitting: Cardiology

## 2021-10-29 ENCOUNTER — Ambulatory Visit: Payer: Medicare HMO | Admitting: Cardiology

## 2021-10-29 VITALS — BP 140/70 | HR 74 | Ht 61.0 in | Wt 177.6 lb

## 2021-10-29 DIAGNOSIS — I2510 Atherosclerotic heart disease of native coronary artery without angina pectoris: Secondary | ICD-10-CM

## 2021-10-29 DIAGNOSIS — I509 Heart failure, unspecified: Secondary | ICD-10-CM

## 2021-10-29 DIAGNOSIS — E782 Mixed hyperlipidemia: Secondary | ICD-10-CM

## 2021-10-29 DIAGNOSIS — E118 Type 2 diabetes mellitus with unspecified complications: Secondary | ICD-10-CM

## 2021-10-29 DIAGNOSIS — I10 Essential (primary) hypertension: Secondary | ICD-10-CM

## 2021-10-29 DIAGNOSIS — Z794 Long term (current) use of insulin: Secondary | ICD-10-CM

## 2021-10-29 DIAGNOSIS — H52203 Unspecified astigmatism, bilateral: Secondary | ICD-10-CM | POA: Diagnosis not present

## 2021-10-29 DIAGNOSIS — H1820 Unspecified corneal edema: Secondary | ICD-10-CM | POA: Diagnosis not present

## 2021-10-29 DIAGNOSIS — E119 Type 2 diabetes mellitus without complications: Secondary | ICD-10-CM | POA: Diagnosis not present

## 2021-10-29 DIAGNOSIS — H04123 Dry eye syndrome of bilateral lacrimal glands: Secondary | ICD-10-CM | POA: Diagnosis not present

## 2021-10-29 LAB — HM DIABETES EYE EXAM

## 2021-10-29 NOTE — Progress Notes (Signed)
Cardiology Office Note:    Date:  10/29/2021   ID:  SHARNITA BOGUCKI, DOB 1946/01/19, MRN [id number]  PCP:  Charyl Dancer, NP  Cardiologist:  Jenne Campus, MD    Referring MD: Benito Mccreedy, MD   Chief Complaint  Patient presents with   Follow-up  Doing fine  History of Present Illness:    Bonnie Reed is a 76 y.o. female with past medical history significant for congestive heart failure, essential hypertension, diabetes which is poorly controlled, dyslipidemia.  She was referred to me for evaluation before surgery for mastectomy secondary to breast cancer.  We performed stress test which showed no evidence of ischemia, we also did echocardiogram which showed normal left ventricle ejection fraction with some left ventricle hypertrophy.  Diastolic dysfunction was grade 1.  She is coming today for follow-up.  She went to surgery with no difficulties.  There is no cardiac complication from heart standpoint review.  Overall she is doing well.  Past Medical History:  Diagnosis Date   Allergy    Anemia    during knee replacement 15 years ago   Blood transfusion without reported diagnosis    Breast cancer ([place]) 1997   Left Breast Cancer   Calculus of kidney    Cancer ([place])    Cervical spondylosis    CHF (congestive heart failure) (HCC)    Coronary artery disease involving native coronary artery of native heart without angina pectoris    COVID 04/2021   mild case   Diabetes mellitus without complication (HCC)    Diverticulosis    GERD (gastroesophageal reflux disease)    without esophagitis   History of breast cancer    History of kidney stones    Hyperlipidemia    Hypertension    Lumbar spondylosis    Osteoarthritis    right knee   Osteopenia due to cancer therapy 09/30/2021   Other eczema    Personal history of radiation therapy 1997   Left Breast Cancer   Recurrent major depressive disorder ([place])    Thyroid goiter     Past Surgical History:  Procedure  Laterality Date   APPENDECTOMY     BREAST BIOPSY Right 08/01/2021   BREAST LUMPECTOMY Left 1997   CESAREAN SECTION     CHOLECYSTECTOMY     KNEE ARTHROSCOPY Right    LESION REMOVAL Left 08/25/2021   Procedure: EXCISION SKIN LESION LEFT CHEST WALL;  Surgeon: Jovita Kussmaul, MD;  Location: [facility];  Service: General;  Laterality: Left;   REDUCTION MAMMAPLASTY Right 1998   SIMPLE MASTECTOMY WITH AXILLARY SENTINEL NODE BIOPSY Left 08/25/2021   Procedure: LEFT MASTECTOMY;  Surgeon: Jovita Kussmaul, MD;  Location: [facility];  Service: General;  Laterality: Left;   TOTAL HIP ARTHROPLASTY Right    TUBAL LIGATION      Current Medications: Current Meds  Medication Sig   ACCU-CHEK SOFTCLIX LANCETS lancets Use to check blood sugar three times daily or as directed. (Patient taking differently: 1 each by Other route See admin instructions. Use to check blood sugar three times daily or as directed.)   aspirin EC 81 MG tablet Take 81 mg by mouth daily.   atorvastatin (LIPITOR) 40 MG tablet Take 40 mg by mouth daily.   Blood Glucose Monitoring Suppl (GLUCOCOM BLOOD GLUCOSE MONITOR) DEVI 1 each by Other route daily.   Carboxymethylcell-Hypromellose (GENTEAL OP) Place 1 drop into both eyes daily as needed (dry eyes).   cetirizine (ZYRTEC) 10 MG tablet Take 10 mg  by mouth daily as needed for allergies.   cholecalciferol (VITAMIN D3) 25 MCG (1000 UNIT) tablet Take 1,000 Units by mouth daily.   fluticasone (FLONASE) 50 MCG/ACT nasal spray Place 2 sprays into both nostrils daily as needed for allergies.   folic acid (FOLVITE) 1 MG tablet Take 1 tablet (1 mg total) by mouth daily.   gabapentin (NEURONTIN) 100 MG capsule Take 100 mg by mouth 2 (two) times daily as needed (pain).   glucose blood (ONETOUCH VERIO) test strip Use to test 3 times a day Dx:E11.9 (Patient taking differently: 1 each by Other route See admin instructions. Use to test 3 times a day Dx:E11.9)   ibuprofen (ADVIL) 200 MG tablet Take 200 mg by mouth  every 6 (six) hours as needed for moderate pain.   JARDIANCE 10 MG TABS tablet Take 1 tablet by mouth once daily (Patient taking differently: Take 10 mg by mouth daily.)   Lancet Devices (ONE TOUCH DELICA LANCING DEV) MISC Use to test 3 times a day Dx:E11.9 (Patient taking differently: 1 each by Other route See admin instructions. Use to test 3 times a day Dx:E11.9)   letrozole (FEMARA) 2.5 MG tablet Take 1 tablet (2.5 mg total) by mouth daily.   LEVEMIR FLEXTOUCH 100 UNIT/ML FlexTouch Pen Inject 35 Units into the skin 2 (two) times daily.   LINZESS 145 MCG CAPS capsule Take 145 mcg by mouth daily as needed (constipation).   NOVOLOG FLEXPEN 100 UNIT/ML FlexPen Inject 15 Units into the skin 3 (three) times daily with meals.   RELION PEN NEEDLES 31G X 6 MM MISC 1 each by Other route See admin instructions.   Semaglutide,0.25 or 0.'5MG'$ /DOS, (OZEMPIC, 0.25 OR 0.5 MG/DOSE,) 2 MG/1.5ML SOPN Inject 0.25 mg into the skin once a week. Start with 0.'25MG'$  once a week x 4 weeks, then increase to 0.'5MG'$  weekly.   valsartan (DIOVAN) 80 MG tablet Take 1 tablet (80 mg total) by mouth daily.     Allergies:   Phenylpropanol   Social History   Socioeconomic History   Marital status: Divorced    Spouse name: Not on file   Number of children: 4   Years of education: Not on file   Highest education level: Not on file  Occupational History   Occupation: Retired  Tobacco Use   Smoking status: Never   Smokeless tobacco: Never  Vaping Use   Vaping Use: Never used  Substance and Sexual Activity   Alcohol use: No   Drug use: No   Sexual activity: Not Currently  Other Topics Concern   Not on file  Social History Narrative   Not on file   Social Determinants of Health   Financial Resource Strain: Not on file  Food Insecurity: Not on file  Transportation Needs: Not on file  Physical Activity: Not on file  Stress: Not on file  Social Connections: Not on file     Family History: The patient's family  history includes Cancer in her father; Dementia in her mother; Kidney disease in her mother; Stroke in her mother. There is no history of Colon cancer or Rectal cancer. ROS:   Please see the history of present illness.    All 14 point review of systems negative except as described per history of present illness  EKGs/Labs/Other Studies Reviewed:      Recent Labs: 09/24/2021: ALT 14; BUN 17; Creatinine 1.18; Hemoglobin 12.8; Platelet Count 285; Potassium 3.9; Sodium 139  Recent Lipid Panel    Component Value Date/Time  CHOL 162 02/08/2019 1049   TRIG 100 02/08/2019 1049   HDL 41 02/08/2019 1049   CHOLHDL 4.0 02/08/2019 1049   LDLCALC 103 (H) 02/08/2019 1049    Physical Exam:    VS:  BP (!) 140/70 (BP Location: Left Arm, Patient Position: Sitting)   Pulse 74   Ht '5\' 1"'$  (1.549 m)   Wt 177 lb 9.6 oz (80.6 kg)   LMP 10/17/1991 (Exact Date)   SpO2 93%   BMI 33.56 kg/m     Wt Readings from Last 3 Encounters:  10/29/21 177 lb 9.6 oz (80.6 kg)  10/24/21 177 lb 12.8 oz (80.6 kg)  09/24/21 176 lb (79.8 kg)     GEN:  Well nourished, well developed in no acute distress HEENT: Normal NECK: No JVD; No carotid bruits LYMPHATICS: No lymphadenopathy CARDIAC: RRR, no murmurs, no rubs, no gallops RESPIRATORY:  Clear to auscultation without rales, wheezing or rhonchi  ABDOMEN: Soft, non-tender, non-distended MUSCULOSKELETAL:  No edema; No deformity  SKIN: Warm and dry LOWER EXTREMITIES: no swelling NEUROLOGIC:  Alert and oriented x 3 PSYCHIATRIC:  Normal affect   ASSESSMENT:    1. Coronary artery disease involving native coronary artery of native heart without angina pectoris   2. Congestive heart failure, unspecified HF chronicity, unspecified heart failure type ([place])   3. Primary hypertension   4. Type 2 diabetes mellitus with complication, with long-term current use of insulin (HCC)    PLAN:    In order of problems listed above:  History of congestive heart failure I  suspect we will talk about diastolic congestive heart for but she looks compensated on the physical exam and overall doing well.  She is very happy.  She went to surgery with no difficulties. Coronary artery disease.  Her stress test has been performed which is negative she is on antiplatelet therapy which I will continue Dyslipidemia she is on Lipitor 40, I do not see any fasting lipid profile done recently, will make arrangements for test to be performed Diabetes sadly very poorly controlled with her hemoglobin A1c just few weeks ago was 10.2 she checked her primary care physician and she hopes that her diabetes will be better controlled.   Medication Adjustments/Labs and Tests Ordered: Current medicines are reviewed at length with the patient today.  Concerns regarding medicines are outlined above.  No orders of the defined types were placed in this encounter.  Medication changes: No orders of the defined types were placed in this encounter.   Signed, Park Liter, MD, [facility] 10/29/2021 3:54 PM    [place]

## 2021-10-29 NOTE — Patient Instructions (Signed)
Medication Instructions:  Your physician recommends that you continue on your current medications as directed. Please refer to the Current Medication list given to you today.  *If you need a refill on your cardiac medications before your next appointment, please call your pharmacy*   Lab Work: Your physician recommends that you return for lab work in: For Fasting Lipid Panel Lab opens at [facility] an appointment. Best time to come is between 8am and 12noon and between 1:30 and 4:30. If you have been asked to fast for your blood work please have nothing to eat or drink after midnight. You may have water.   If you have labs (blood work) drawn today and your tests are completely normal, you will receive your results only by: [place] (if you have MyChart) OR A paper copy in the mail If you have any lab test that is abnormal or we need to change your treatment, we will call you to review the results.   Testing/Procedures: NONE   Follow-Up: At [facility], you and your health needs are our priority.  As part of our continuing mission to provide you with exceptional heart care, we have created designated Provider Care Teams.  These Care Teams include your primary Cardiologist (physician) and Advanced Practice Providers (APPs -  Physician Assistants and Nurse Practitioners) who all work together to provide you with the care you need, when you need it.  We recommend signing up for the patient portal called "MyChart".  Sign up information is provided on this After Visit Summary.  MyChart is used to connect with patients for Virtual Visits (Telemedicine).  Patients are able to view lab/test results, encounter notes, upcoming appointments, etc.  Non-urgent messages can be sent to your provider as well.   To learn more about what you can do with MyChart, go to [email or web address].    Your next appointment:   1 year(s)  The format for your next appointment:   In  Person  Provider:   Jenne Campus, MD    Other Instructions   Important Information About Sugar

## 2021-10-30 ENCOUNTER — Encounter (HOSPITAL_COMMUNITY): Payer: Self-pay

## 2021-11-05 ENCOUNTER — Inpatient Hospital Stay: Payer: Medicare HMO

## 2021-11-05 ENCOUNTER — Encounter: Payer: Self-pay | Admitting: *Deleted

## 2021-11-05 ENCOUNTER — Encounter: Payer: Self-pay | Admitting: Hematology & Oncology

## 2021-11-05 ENCOUNTER — Inpatient Hospital Stay: Payer: Medicare HMO | Attending: Gastroenterology

## 2021-11-05 ENCOUNTER — Other Ambulatory Visit: Payer: Self-pay

## 2021-11-05 ENCOUNTER — Inpatient Hospital Stay: Payer: Medicare HMO | Admitting: Hematology & Oncology

## 2021-11-05 VITALS — BP 137/74 | HR 71 | Temp 98.1°F | Resp 18 | Ht 61.0 in | Wt 175.0 lb

## 2021-11-05 DIAGNOSIS — Z79811 Long term (current) use of aromatase inhibitors: Secondary | ICD-10-CM | POA: Insufficient documentation

## 2021-11-05 DIAGNOSIS — M8580 Other specified disorders of bone density and structure, unspecified site: Secondary | ICD-10-CM | POA: Diagnosis not present

## 2021-11-05 DIAGNOSIS — M158 Other polyosteoarthritis: Secondary | ICD-10-CM

## 2021-11-05 DIAGNOSIS — D563 Thalassemia minor: Secondary | ICD-10-CM | POA: Insufficient documentation

## 2021-11-05 DIAGNOSIS — C50312 Malignant neoplasm of lower-inner quadrant of left female breast: Secondary | ICD-10-CM

## 2021-11-05 DIAGNOSIS — C50912 Malignant neoplasm of unspecified site of left female breast: Secondary | ICD-10-CM | POA: Insufficient documentation

## 2021-11-05 DIAGNOSIS — Z170 Estrogen receptor positive status [ER+]: Secondary | ICD-10-CM

## 2021-11-05 LAB — CBC WITH DIFFERENTIAL (CANCER CENTER ONLY)
Abs Immature Granulocytes: 0.02 10*3/uL (ref 0.00–0.07)
Basophils Absolute: 0.1 10*3/uL (ref 0.0–0.1)
Basophils Relative: 1 %
Eosinophils Absolute: 0.3 10*3/uL (ref 0.0–0.5)
Eosinophils Relative: 4 %
HCT: 42.8 % (ref 36.0–46.0)
Hemoglobin: 12.8 g/dL (ref 12.0–15.0)
Immature Granulocytes: 0 %
Lymphocytes Relative: 29 %
Lymphs Abs: 2.5 10*3/uL (ref 0.7–4.0)
MCH: 22.4 pg — ABNORMAL LOW (ref 26.0–34.0)
MCHC: 29.9 g/dL — ABNORMAL LOW (ref 30.0–36.0)
MCV: 74.8 fL — ABNORMAL LOW (ref 80.0–100.0)
Monocytes Absolute: 0.8 10*3/uL (ref 0.1–1.0)
Monocytes Relative: 9 %
Neutro Abs: 5 10*3/uL (ref 1.7–7.7)
Neutrophils Relative %: 57 %
Platelet Count: 273 10*3/uL (ref 150–400)
RBC: 5.72 MIL/uL — ABNORMAL HIGH (ref 3.87–5.11)
RDW: 17.2 % — ABNORMAL HIGH (ref 11.5–15.5)
WBC Count: 8.7 10*3/uL (ref 4.0–10.5)
nRBC: 0 % (ref 0.0–0.2)

## 2021-11-05 LAB — CMP (CANCER CENTER ONLY)
ALT: 28 U/L (ref 0–44)
AST: 30 U/L (ref 15–41)
Albumin: 4.3 g/dL (ref 3.5–5.0)
Alkaline Phosphatase: 198 U/L — ABNORMAL HIGH (ref 38–126)
Anion gap: 9 (ref 5–15)
BUN: 22 mg/dL (ref 8–23)
CO2: 24 mmol/L (ref 22–32)
Calcium: 9.9 mg/dL (ref 8.9–10.3)
Chloride: 107 mmol/L (ref 98–111)
Creatinine: 1.24 mg/dL — ABNORMAL HIGH (ref 0.44–1.00)
GFR, Estimated: 45 mL/min — ABNORMAL LOW (ref >60)
Glucose, Bld: 162 mg/dL — ABNORMAL HIGH (ref 70–99)
Potassium: 4 mmol/L (ref 3.5–5.1)
Sodium: 140 mmol/L (ref 135–145)
Total Bilirubin: 0.5 mg/dL (ref 0.3–1.2)
Total Protein: 7.8 g/dL (ref 6.5–8.1)

## 2021-11-05 LAB — VITAMIN D 25 HYDROXY (VIT D DEFICIENCY, FRACTURES): Vit D, 25-Hydroxy: 61.75 ng/mL (ref 30–100)

## 2021-11-05 MED ORDER — DENOSUMAB 60 MG/ML ~~LOC~~ SOSY
60.0000 mg | PREFILLED_SYRINGE | Freq: Once | SUBCUTANEOUS | Status: AC
  Administered 2021-11-05: 60 mg via SUBCUTANEOUS
  Filled 2021-11-05: qty 1

## 2021-11-05 NOTE — Patient Instructions (Signed)
Denosumab Injection (Osteoporosis) What is this medication? DENOSUMAB (den oh SUE mab) prevents and treats osteoporosis. It works by making your bones stronger and less likely to break (fracture). It is a monoclonal antibody. This medicine may be used for other purposes; ask your health care provider or pharmacist if you have questions. COMMON BRAND NAME(S): Prolia What should I tell my care team before I take this medication? They need to know if you have any of these conditions: Dental or gum disease, or plan to have dental surgery or a tooth pulled Infection Kidney disease Low levels of calcium or vitamin D in your blood On dialysis Poor nutrition Skin conditions Thyroid disease, or have had thyroid or parathyroid surgery Trouble absorbing minerals in your stomach or intestine An unusual reaction to denosumab, other medications, foods, dyes, or preservatives Pregnant or trying to get pregnant Breast-feeding How should I use this medication? This medication is injected under the skin. It is given by your care team in a hospital or clinic setting. A special MedGuide will be given to you before each treatment. Be sure to read this information carefully each time. Talk to your care team about the use of this medication in children. Special care may be needed. Overdosage: If you think you have taken too much of this medicine contact a poison control center or emergency room at once. NOTE: This medicine is only for you. Do not share this medicine with others. What if I miss a dose? Keep appointments for follow-up doses. It is important not to miss your dose. Call your care team if you are unable to keep an appointment. What may interact with this medication? Do not take this medication with any of the following: Other medications that contain denosumab This medication may also interact with the following: Medications that lower your chance of fighting infection Steroid medications, such  as prednisone or cortisone This list may not describe all possible interactions. Give your health care provider a list of all the medicines, herbs, non-prescription drugs, or dietary supplements you use. Also tell them if you smoke, drink alcohol, or use illegal drugs. Some items may interact with your medicine. What should I watch for while using this medication? Your condition will be monitored carefully while you are receiving this medication. You may need blood work while taking this medication. This medication may increase your risk of getting an infection. Call your care team for advice if you get a fever, chills, sore throat, or other symptoms of a cold or flu. Do not treat yourself. Try to avoid being around people who are sick. Tell your dentist and dental surgeon that you are taking this medication. You should not have major dental surgery while on this medication. See your dentist to have a dental exam and fix any dental problems before starting this medication. Take good care of your teeth while on this medication. Make sure you see your dentist for regular follow-up appointments. You should make sure you get enough calcium and vitamin D while you are taking this medication. Discuss the foods you eat and the vitamins you take with your care team. Talk to your care team if you are pregnant or think you might be pregnant. This medication can cause serious birth defects if taken during pregnancy and for 5 months after the last dose. You will need a negative pregnancy test before starting this medication. Contraception is recommended while taking this medication and for 5 months after the last dose. Your care team can   help you find the option that works for you. Talk to your care team before breastfeeding. Changes to your treatment plan may be needed. What side effects may I notice from receiving this medication? Side effects that you should report to your care team as soon as possible: Allergic  reactions--skin rash, itching, hives, swelling of the face, lips, tongue, or throat Infection--fever, chills, cough, sore throat, wounds that don't heal, pain or trouble when passing urine, general feeling of discomfort or being unwell Low calcium level--muscle pain or cramps, confusion, tingling, or numbness in the hands or feet Osteonecrosis of the jaw--pain, swelling, or redness in the mouth, numbness of the jaw, poor healing after dental work, unusual discharge from the mouth, visible bones in the mouth Severe bone, joint, or muscle pain Skin infection--skin redness, swelling, warmth, or pain Side effects that usually do not require medical attention (report these to your care team if they continue or are bothersome): Back pain Headache Joint pain Muscle pain Pain in the hands, arms, legs, or feet Runny or stuffy nose Sore throat This list may not describe all possible side effects. Call your doctor for medical advice about side effects. You may report side effects to FDA at 1-800-FDA-1088. Where should I keep my medication? This medication is given in a hospital or clinic. It will not be stored at home. NOTE: This sheet is a summary. It may not cover all possible information. If you have questions about this medicine, talk to your doctor, pharmacist, or health care provider.  2023 Elsevier/Gold Standard (2021-07-21 00:00:00)  

## 2021-11-05 NOTE — Progress Notes (Signed)
Oncology Nurse Navigator Documentation     11/05/2021   10:45 AM  Oncology Nurse Navigator Flowsheets  Navigation Complete Date: 11/05/2021  Post Navigation: Continue to Follow Patient? No  Reason Not Navigating Patient: Patient On Maintenance Chemotherapy  Navigator Location [facility]  Navigator Encounter Type Appt/Treatment Plan Review  Patient Visit Type MedOnc  Treatment Phase Active Tx  Barriers/Navigation Needs No Barriers At This Time  Interventions None Required  Acuity Level 1-No Barriers  Support Groups/Services Friends and Family  Time Spent with Patient 15

## 2021-11-05 NOTE — Progress Notes (Signed)
Hematology and Oncology Follow Up Visit  Bonnie Reed [id number] 1945-03-25 76 y.o. 11/05/2021   Principle Diagnosis:  Invasive ductal carcinoma of the LEFT breast -- Stage 1 (T1cNxM0) --ER+/HER2- -- Oncotype = 3 Alpha thalassemia minor trait  Current Therapy:  S/P LEFT MRM on 51/04/5850  Folic acid 1 mg PO daily Femara 2.5 mg p.o. daily -start on 10/03/2021 Prolia 60 mg IM q. 6 months --next dose on 05/12/2022   Interim History:  Bonnie Reed is here today for follow-up.  She is feeling quite well.  She is on Femara right now.  She is doing well with the Femara.  We did do a bone density test on her.  This was done on 09/30/2021.  This did show osteopenia.  We will go ahead and put her on Prolia.  She has had no issues.  She had no cough or shortness of breath.  She has had no nausea or vomiting.  There has been no change in bowel or bladder habits.  She has had no rashes.  She does have alpha thalassemia.  She is on folic acid for this.  She was tell me all about her daughter's trip to [place].  Sounds like she had a wonderful time.  She has had no headache.  Overall, I would say performance status is probably ECOG 0.    Medications:  Allergies as of 11/05/2021       Reactions   Phenylpropanol Other (See Comments)   Unknown/cannot remember        Medication List        Accurate as of November 05, 2021 12:01 PM. If you have any questions, ask your nurse or doctor.          Accu-Chek Softclix Lancets lancets Use to check blood sugar three times daily or as directed. What changed:  how much to take how to take this when to take this   aspirin EC 81 MG tablet Take 81 mg by mouth daily.   atorvastatin 40 MG tablet Commonly known as: LIPITOR Take 40 mg by mouth daily.   cetirizine 10 MG tablet Commonly known as: ZYRTEC Take 10 mg by mouth daily as needed for allergies.   cholecalciferol 25 MCG (1000 UNIT) tablet Commonly known as: VITAMIN D3 Take 1,000 Units  by mouth daily.   fluticasone 50 MCG/ACT nasal spray Commonly known as: FLONASE Place 2 sprays into both nostrils daily as needed for allergies.   folic acid 1 MG tablet Commonly known as: FOLVITE Take 1 tablet (1 mg total) by mouth daily.   gabapentin 100 MG capsule Commonly known as: NEURONTIN Take 100 mg by mouth 2 (two) times daily as needed (pain).   GENTEAL OP Place 1 drop into both eyes daily as needed (dry eyes).   GlucoCom Blood Glucose Monitor Devi 1 each by Other route daily.   ibuprofen 200 MG tablet Commonly known as: ADVIL Take 200 mg by mouth every 6 (six) hours as needed for moderate pain.   Jardiance 10 MG Tabs tablet Generic drug: empagliflozin Take 1 tablet by mouth once daily What changed: how much to take   letrozole 2.5 MG tablet Commonly known as: FEMARA Take 1 tablet (2.5 mg total) by mouth daily.   Levemir FlexTouch 100 UNIT/ML FlexPen Generic drug: insulin detemir Inject 35 Units into the skin 2 (two) times daily.   Linzess 145 MCG Caps capsule Generic drug: linaclotide Take 145 mcg by mouth daily as needed (constipation).   NovoLOG FlexPen  100 UNIT/ML FlexPen Generic drug: insulin aspart Inject 15 Units into the skin 3 (three) times daily with meals.   ONE TOUCH DELICA LANCING DEV Misc Use to test 3 times a day Dx:E11.9 What changed:  how much to take how to take this when to take this   OneTouch Verio test strip Generic drug: glucose blood Use to test 3 times a day Dx:E11.9 What changed:  how much to take how to take this when to take this   Ozempic (0.25 or 0.5 MG/DOSE) 2 MG/1.5ML Sopn Generic drug: Semaglutide(0.25 or 0.5MG /DOS) Inject 0.25 mg into the skin once a week. Start with 0.25MG  once a week x 4 weeks, then increase to 0.5MG  weekly.   prednisoLONE acetate 1 % ophthalmic suspension Commonly known as: PRED FORTE 1 drop 2 (two) times daily.   ReliOn Pen Needles 31G X 6 MM Misc Generic drug: Insulin Pen Needle 1  each by Other route See admin instructions.   valsartan 80 MG tablet Commonly known as: DIOVAN Take 1 tablet (80 mg total) by mouth daily.        Allergies:  Allergies  Allergen Reactions   Phenylpropanol Other (See Comments)    Unknown/cannot remember    Past Medical History, Surgical history, Social history, and Family History were reviewed and updated.  Review of Systems: Review of Systems  Constitutional: Negative.   HENT: Negative.  Negative for hearing loss.   Eyes: Negative.   Respiratory: Negative.    Cardiovascular: Negative.   Gastrointestinal: Negative.   Genitourinary: Negative.   Musculoskeletal: Negative.   Skin: Negative.   Neurological: Negative.   Endo/Heme/Allergies: Negative.   Psychiatric/Behavioral: Negative.       Physical Exam:  height is 5\' 1"  (1.549 m) and weight is 175 lb (79.4 kg). Her oral temperature is 98.1 F (36.7 C). Her blood pressure is 137/74 and her pulse is 71. Her respiration is 18 and oxygen saturation is 100%.   Wt Readings from Last 3 Encounters:  11/05/21 175 lb (79.4 kg)  10/29/21 177 lb 9.6 oz (80.6 kg)  10/24/21 177 lb 12.8 oz (80.6 kg)   Physical Exam Vitals reviewed.  Constitutional:      Comments: Breast exam shows right breast with no masses, edema or erythema.  There is no right axillary adenopathy.  Left chest wall does show the left mastectomy.  This is healing.  There is no erythema or swelling.  There may be little bit of tenderness at the lateral aspect of the mastectomy.  There is no left axillary lymph nodes.    HENT:     Head: Normocephalic and atraumatic.  Eyes:     Pupils: Pupils are equal, round, and reactive to light.  Cardiovascular:     Rate and Rhythm: Normal rate and regular rhythm.     Heart sounds: Normal heart sounds.     Comments: Cardiac exam shows regular rate and rhythm with normal S1 and S2.  I do not hear any murmurs, rubs or bruits. Pulmonary:     Effort: Pulmonary effort is normal.      Breath sounds: Normal breath sounds.  Abdominal:     General: Bowel sounds are normal.     Palpations: Abdomen is soft.  Musculoskeletal:        General: No tenderness or deformity. Normal range of motion.     Cervical back: Normal range of motion.     Comments: Extremity shows some mild chronic swelling in the left arm.  There is  no swelling in the legs.  Lymphadenopathy:     Cervical: No cervical adenopathy.  Skin:    General: Skin is warm and dry.     Findings: No erythema or rash.  Neurological:     Mental Status: She is alert and oriented to person, place, and time.  Psychiatric:        Behavior: Behavior normal.        Thought Content: Thought content normal.        Judgment: Judgment normal.      Lab Results  Component Value Date   WBC 8.7 11/05/2021   HGB 12.8 11/05/2021   HCT 42.8 11/05/2021   MCV 74.8 (L) 11/05/2021   PLT 273 11/05/2021   Lab Results  Component Value Date   FERRITIN 118 09/24/2021   IRON 85 09/24/2021   TIBC 417 09/24/2021   UIBC 332 09/24/2021   IRONPCTSAT 20 09/24/2021   Lab Results  Component Value Date   RETICCTPCT 1.9 09/24/2021   RBC 5.72 (H) 11/05/2021   No results found for: "KPAFRELGTCHN", "LAMBDASER", "KAPLAMBRATIO" No results found for: "IGGSERUM", "IGA", "IGMSERUM" No results found for: "TOTALPROTELP", "ALBUMINELP", "A1GS", "A2GS", "BETS", "BETA2SER", "GAMS", "MSPIKE", "SPEI"   Chemistry      Component Value Date/Time   NA 140 11/05/2021 1022   NA 141 02/08/2019 1049   K 4.0 11/05/2021 1022   CL 107 11/05/2021 1022   CO2 24 11/05/2021 1022   BUN 22 11/05/2021 1022   BUN 15 02/08/2019 1049   CREATININE 1.24 (H) 11/05/2021 1022      Component Value Date/Time   CALCIUM 9.9 11/05/2021 1022   ALKPHOS 198 (H) 11/05/2021 1022   AST 30 11/05/2021 1022   ALT 28 11/05/2021 1022   BILITOT 0.5 11/05/2021 1022       Impression and Plan: Ms. Souffrant is a very pleasant 76 yo African American female with alpha thalassemia  minor trait.  She has remote history of breast cancer of the left breast.  She now has another breast cancer.  Thankfully, this is early stage.  Even more important is the fact that it is estrogen positive and HER2 negative.  There is a very low Oncotype score.  I think she is doing quite well.  Again, she is on Femara.  We will keep her on Femara for at least 5 years.  I will go ahead and give her Prolia today.  Her labs look great.  We will plan to get her back in about 3 months.  I would like to get her back before the Holiday season starts.   Volanda Napoleon, MD 8/16/202312:01 PM

## 2021-11-20 DIAGNOSIS — E113291 Type 2 diabetes mellitus with mild nonproliferative diabetic retinopathy without macular edema, right eye: Secondary | ICD-10-CM | POA: Diagnosis not present

## 2021-11-20 DIAGNOSIS — H16203 Unspecified keratoconjunctivitis, bilateral: Secondary | ICD-10-CM | POA: Diagnosis not present

## 2021-11-20 DIAGNOSIS — H43813 Vitreous degeneration, bilateral: Secondary | ICD-10-CM | POA: Diagnosis not present

## 2021-11-21 ENCOUNTER — Ambulatory Visit: Payer: Medicare HMO | Admitting: Family

## 2021-11-21 ENCOUNTER — Other Ambulatory Visit: Payer: Medicare HMO

## 2021-12-02 NOTE — Progress Notes (Unsigned)
   Established Patient Office Visit  Subjective   Patient ID: Bonnie Reed, female    DOB: [birth year]  Age: 76 y.o. MRN: [id number]  No chief complaint on file.   HPI  Bonnie Reed is here to follow-up on diabetes. Last visit she was started on ozempic 0.'25mg'$  injection weekly for 4 weeks, then increase to 0.'5mg'$  weekly.   {History (Optional):23778}  ROS    Objective:     LMP 10/17/1991 (Exact Date)  {Vitals History (Optional):23777}  Physical Exam   No results found for any visits on 12/03/21.  {Labs (Optional):23779}  The 10-year ASCVD risk score (Arnett DK, et al., 2019) is: 27.5%    Assessment & Plan:   Problem List Items Addressed This Visit   None   No follow-ups on file.    Charyl Dancer, NP

## 2021-12-03 ENCOUNTER — Ambulatory Visit (INDEPENDENT_AMBULATORY_CARE_PROVIDER_SITE_OTHER): Payer: Medicare HMO | Admitting: Nurse Practitioner

## 2021-12-03 ENCOUNTER — Encounter: Payer: Self-pay | Admitting: Nurse Practitioner

## 2021-12-03 VITALS — BP 144/64 | HR 82 | Temp 97.5°F | Wt 172.0 lb

## 2021-12-03 DIAGNOSIS — Z23 Encounter for immunization: Secondary | ICD-10-CM | POA: Diagnosis not present

## 2021-12-03 DIAGNOSIS — G4700 Insomnia, unspecified: Secondary | ICD-10-CM

## 2021-12-03 DIAGNOSIS — Z794 Long term (current) use of insulin: Secondary | ICD-10-CM | POA: Diagnosis not present

## 2021-12-03 DIAGNOSIS — E118 Type 2 diabetes mellitus with unspecified complications: Secondary | ICD-10-CM | POA: Diagnosis not present

## 2021-12-03 HISTORY — DX: Insomnia, unspecified: G47.00

## 2021-12-03 LAB — POCT GLYCOSYLATED HEMOGLOBIN (HGB A1C)
HbA1c POC (<> result, manual entry): 7.7 % (ref 4.0–5.6)
HbA1c, POC (controlled diabetic range): 7.7 % — AB (ref 0.0–7.0)
Hemoglobin A1C: 7.7 % — AB (ref 4.0–5.6)

## 2021-12-03 MED ORDER — TRAZODONE HCL 50 MG PO TABS: 25.0000 mg | ORAL_TABLET | Freq: Every evening | ORAL | 0 refills | Status: DC | PRN

## 2021-12-03 MED ORDER — SEMAGLUTIDE (1 MG/DOSE) 4 MG/3ML ~~LOC~~ SOPN: 1.0000 mg | PEN_INJECTOR | SUBCUTANEOUS | 1 refills | Status: DC

## 2021-12-03 MED ORDER — FLUTICASONE PROPIONATE 50 MCG/ACT NA SUSP: 2.0000 | Freq: Every day | NASAL | 3 refills | Status: DC | PRN

## 2021-12-03 MED ORDER — LINZESS 145 MCG PO CAPS: 145.0000 ug | ORAL_CAPSULE | Freq: Every day | ORAL | 2 refills | Status: DC | PRN

## 2021-12-03 MED ORDER — LEVEMIR FLEXTOUCH 100 UNIT/ML ~~LOC~~ SOPN
35.0000 [IU] | PEN_INJECTOR | Freq: Two times a day (BID) | SUBCUTANEOUS | 1 refills | Status: DC
Start: 2021-12-03 — End: 2022-01-26

## 2021-12-03 MED ORDER — VALSARTAN 80 MG PO TABS: 80.0000 mg | ORAL_TABLET | Freq: Every day | ORAL | 1 refills | Status: DC

## 2021-12-03 MED ORDER — EMPAGLIFLOZIN 10 MG PO TABS: 10.0000 mg | ORAL_TABLET | Freq: Every day | ORAL | 1 refills | Status: DC

## 2021-12-03 MED ORDER — ATORVASTATIN CALCIUM 40 MG PO TABS: 40.0000 mg | ORAL_TABLET | Freq: Every day | ORAL | 1 refills | Status: DC

## 2021-12-03 NOTE — Patient Instructions (Addendum)
It was great to see you!  Keep up the great work!   After 4 weeks of the 0.'5mg'$  dose of ozempic, increase to '1mg'$  injection weekly of the ozempic. Keep checking your blood sugars. When you go up to '1mg'$  injection of ozempic, decrease your levemir to 30 units twice a day.   Start trazodone as needed at bedtime for sleep. You can take 1/2 tablet to 1 whole tablet.   You can start miralax once a day as needed for constipation.   Let's follow-up in 4 weeks, sooner if you have concerns.  If a referral was placed today, you will be contacted for an appointment. Please note that routine referrals can sometimes take up to 3-4 weeks to process. Please call our office if you haven't heard anything after this time frame.  Take care,  Vance Peper, NP

## 2021-12-03 NOTE — Assessment & Plan Note (Signed)
He has been having some trouble with insomnia, especially falling asleep.  Discussed nighttime routines, decreasing lights, phone, electronics, and drinking caffeine in the morning.  We will have her start trazodone 25 mg as needed at bedtime for sleep.  Follow-up in 4 weeks

## 2021-12-03 NOTE — Assessment & Plan Note (Signed)
Chronic, improving.  Her A1c went from 10.2% down to 7.7%.  We will have her continue Jardiance 10 mg daily and NovoLog 18 units with meals.  She will continue increasing her Ozempic to 1 mg injection weekly.  We will decrease her Levemir to 30 units twice a day and continue to have her monitor her blood sugars.  She declines a continuous blood sugar monitor.  She is up-to-date on her eye exam.  We will refill her valsartan and atorvastatin today.  Keep monitoring for low blood sugars and if this occurs please call the office.  Follow-up in 4 weeks.

## 2021-12-11 ENCOUNTER — Telehealth: Payer: Self-pay | Admitting: Nurse Practitioner

## 2021-12-11 DIAGNOSIS — E1122 Type 2 diabetes mellitus with diabetic chronic kidney disease: Secondary | ICD-10-CM

## 2021-12-11 NOTE — Telephone Encounter (Signed)
Caller Name: Pt Call back phone #: [phone]  Reason for Call: Pt needs her Verio test strips and 72m needles. She has also requested '800mg'$  amoxicillin.

## 2021-12-12 MED ORDER — INSULIN PEN NEEDLE 31G X 4 MM MISC: 1.0000 | Freq: Three times a day (TID) | 2 refills | Status: DC

## 2021-12-12 MED ORDER — ONETOUCH VERIO VI STRP: ORAL_STRIP | 12 refills | Status: DC

## 2021-12-15 ENCOUNTER — Encounter: Payer: Self-pay | Admitting: Nurse Practitioner

## 2021-12-15 ENCOUNTER — Ambulatory Visit (INDEPENDENT_AMBULATORY_CARE_PROVIDER_SITE_OTHER): Payer: Medicare HMO | Admitting: Nurse Practitioner

## 2021-12-15 VITALS — BP 110/58 | HR 77 | Temp 96.8°F | Wt 175.0 lb

## 2021-12-15 DIAGNOSIS — J0140 Acute pansinusitis, unspecified: Secondary | ICD-10-CM

## 2021-12-15 MED ORDER — AMOXICILLIN-POT CLAVULANATE 875-125 MG PO TABS: 1.0000 | ORAL_TABLET | Freq: Two times a day (BID) | ORAL | 0 refills | Status: DC

## 2021-12-15 NOTE — Patient Instructions (Signed)
It was great to see you!  Start aumgentin twice a day for 10 days with food. You can continue your nasal spray. You can keep taking ibuprofen or tylenol as needed.   Let's follow-up in 6-8 weeks, sooner if you have concerns.  If a referral was placed today, you will be contacted for an appointment. Please note that routine referrals can sometimes take up to 3-4 weeks to process. Please call our office if you haven't heard anything after this time frame.  Take care,  Vance Peper, NP

## 2021-12-15 NOTE — Progress Notes (Signed)
Acute Office Visit  Subjective:     Patient ID: Bonnie Reed, female    DOB: [birth year], 76 y.o.   MRN: [id number]  Chief Complaint  Patient presents with   Sinusitis    Sinus pressure, pain, congestion x1 week    HPI Patient is in today for sinus pressure, pain, and congestion for 1 week.  UPPER RESPIRATORY TRACT INFECTION  Fever: no Cough: no Shortness of breath: no Wheezing: no Chest pain: no Chest tightness: no Chest congestion: no Nasal congestion: yes Runny nose: no Post nasal drip: yes Sneezing: yes Sore throat: no Swollen glands: no Sinus pressure: yes Headache: yes Face pain: yes Toothache: no Ear pain: yes "right Ear pressure: no bilateral Eyes red/itching:no Eye drainage/crusting: no  Vomiting: no Rash: no Fatigue: yes Sick contacts: no Strep contacts: no  Context: worse Recurrent sinusitis: no Relief with OTC cold/cough medications: no  Treatments attempted: flonase  ROS See pertinent positives and negatives per HPI.     Objective:    BP (!) 110/58 (BP Location: Right Arm, Patient Position: Sitting, Cuff Size: Large)   Pulse 77   Temp (!) 96.8 F (36 C) (Temporal)   Wt 175 lb (79.4 kg)   LMP 10/17/1991 (Exact Date)   SpO2 97%   BMI 33.07 kg/m    Physical Exam Vitals and nursing note reviewed.  Constitutional:      General: She is not in acute distress.    Appearance: Normal appearance.  HENT:     Head: Normocephalic.     Right Ear: Tympanic membrane, ear canal and external ear normal.     Left Ear: Tympanic membrane, ear canal and external ear normal.     Nose:     Right Sinus: Maxillary sinus tenderness and frontal sinus tenderness present.     Left Sinus: Maxillary sinus tenderness and frontal sinus tenderness present.  Eyes:     Conjunctiva/sclera: Conjunctivae normal.  Cardiovascular:     Rate and Rhythm: Normal rate and regular rhythm.     Pulses: Normal pulses.     Heart sounds: Normal heart sounds.  Pulmonary:      Effort: Pulmonary effort is normal.     Breath sounds: Normal breath sounds.  Musculoskeletal:     Cervical back: Normal range of motion and neck supple. No tenderness.  Lymphadenopathy:     Cervical: No cervical adenopathy.  Skin:    General: Skin is warm.  Neurological:     General: No focal deficit present.     Mental Status: She is alert and oriented to person, place, and time.  Psychiatric:        Mood and Affect: Mood normal.        Behavior: Behavior normal.        Thought Content: Thought content normal.        Judgment: Judgment normal.       Assessment & Plan:   Problem List Items Addressed This Visit   None Visit Diagnoses     Acute non-recurrent pansinusitis    -  Primary   Treat with augmentin BID x10 days. Can continue flonase, tylenol/ibuprofen prn pain. Encourage fluids, rest. F/U if not improving.    Relevant Medications   amoxicillin-clavulanate (AUGMENTIN) 875-125 MG tablet       Meds ordered this encounter  Medications   amoxicillin-clavulanate (AUGMENTIN) 875-125 MG tablet    Sig: Take 1 tablet by mouth 2 (two) times daily.    Dispense:  20 tablet  Refill:  0    Return in about 6 weeks (around 01/26/2022) for Diabetes.  Charyl Dancer, NP

## 2021-12-26 ENCOUNTER — Other Ambulatory Visit: Payer: Self-pay | Admitting: Nurse Practitioner

## 2021-12-31 ENCOUNTER — Ambulatory Visit: Payer: Medicare HMO | Admitting: Nurse Practitioner

## 2022-01-15 DIAGNOSIS — C50312 Malignant neoplasm of lower-inner quadrant of left female breast: Secondary | ICD-10-CM | POA: Diagnosis not present

## 2022-01-15 DIAGNOSIS — Z170 Estrogen receptor positive status [ER+]: Secondary | ICD-10-CM | POA: Diagnosis not present

## 2022-01-22 ENCOUNTER — Other Ambulatory Visit: Payer: Self-pay | Admitting: Nurse Practitioner

## 2022-01-23 NOTE — Progress Notes (Unsigned)
   Established Patient Office Visit  Subjective   Patient ID: SUHAYLAH WAMPOLE, female    DOB: 1945/11/22  Age: 76 y.o. MRN: [id number]  No chief complaint on file.   HPI  Brieana L Burggraf is here to follow-up on diabetes. Last visit her ozempic was increased to '1mg'$  injection weekly.   {History (Optional):23778}  ROS    Objective:     LMP 10/17/1991 (Exact Date)  BP Readings from Last 3 Encounters:  12/15/21 (!) 110/58  12/03/21 (!) 144/64  11/05/21 137/74   Wt Readings from Last 3 Encounters:  12/15/21 175 lb (79.4 kg)  12/03/21 172 lb (78 kg)  11/05/21 175 lb (79.4 kg)      Physical Exam   No results found for any visits on 01/26/22.  {Labs (Optional):23779}  The 10-year ASCVD risk score (Arnett DK, et al., 2019) is: 20.6%    Assessment & Plan:   Problem List Items Addressed This Visit   None   No follow-ups on file.    Charyl Dancer, NP

## 2022-01-26 ENCOUNTER — Ambulatory Visit (INDEPENDENT_AMBULATORY_CARE_PROVIDER_SITE_OTHER): Payer: Medicare HMO | Admitting: Nurse Practitioner

## 2022-01-26 ENCOUNTER — Encounter: Payer: Self-pay | Admitting: Nurse Practitioner

## 2022-01-26 VITALS — BP 132/70 | HR 73 | Temp 96.6°F | Wt 166.4 lb

## 2022-01-26 DIAGNOSIS — Z794 Long term (current) use of insulin: Secondary | ICD-10-CM | POA: Diagnosis not present

## 2022-01-26 DIAGNOSIS — E118 Type 2 diabetes mellitus with unspecified complications: Secondary | ICD-10-CM | POA: Diagnosis not present

## 2022-01-26 DIAGNOSIS — Z8639 Personal history of other endocrine, nutritional and metabolic disease: Secondary | ICD-10-CM | POA: Diagnosis not present

## 2022-01-26 LAB — MICROALBUMIN / CREATININE URINE RATIO
Creatinine,U: 104.6 mg/dL
Microalb Creat Ratio: 2.8 mg/g (ref 0.0–30.0)
Microalb, Ur: 3 mg/dL — ABNORMAL HIGH (ref 0.0–1.9)

## 2022-01-26 LAB — LIPID PANEL
Cholesterol: 143 mg/dL (ref 0–200)
HDL: 40 mg/dL (ref >39.00)
LDL Cholesterol: 86 mg/dL (ref 0–99)
NonHDL: 102.95
Total CHOL/HDL Ratio: 4
Triglycerides: 83 mg/dL (ref 0.0–149.0)
VLDL: 16.6 mg/dL (ref 0.0–40.0)

## 2022-01-26 LAB — VITAMIN B12: Vitamin B-12: 310 pg/mL (ref 211–911)

## 2022-01-26 MED ORDER — LEVEMIR FLEXTOUCH 100 UNIT/ML ~~LOC~~ SOPN: 25.0000 [IU] | PEN_INJECTOR | Freq: Two times a day (BID) | SUBCUTANEOUS | 1 refills | Status: DC

## 2022-01-26 MED ORDER — PANTOPRAZOLE SODIUM 40 MG PO TBEC: 40.0000 mg | DELAYED_RELEASE_TABLET | Freq: Every day | ORAL | 1 refills | Status: DC

## 2022-01-26 NOTE — Patient Instructions (Signed)
It was great to see you!  Decrease your levemir to 25 units twice a day and your novolog to 15 units with meals. Keep checking your blood sugars.   Keep taking all of your other medications  Start pantoprazole (protonix) once a day for your bloating and belching.   Your can try an over the counter b12 supplement to help with fatigue.   Let's follow-up in 5 weeks, sooner if you have concerns.  If a referral was placed today, you will be contacted for an appointment. Please note that routine referrals can sometimes take up to 3-4 weeks to process. Please call our office if you haven't heard anything after this time frame.  Take care,  Vance Peper, NP

## 2022-01-26 NOTE — Assessment & Plan Note (Signed)
Chronic, stable. We are still continuing to decrease her insulin with goal to get off completely. Will decrease her further to levemir 25 units BID and novolog 15 units with meals. Continue ozempic 1 mg weekly. With bloating and belching, will start her on pantoprazole 40 mg daily. Will check urine microalbumin today and lipid panel. Follow-up in 5 weeks.

## 2022-01-31 ENCOUNTER — Other Ambulatory Visit: Payer: Self-pay | Admitting: Nurse Practitioner

## 2022-02-05 ENCOUNTER — Inpatient Hospital Stay: Payer: Medicare HMO | Attending: Gastroenterology

## 2022-02-05 ENCOUNTER — Other Ambulatory Visit: Payer: Self-pay

## 2022-02-05 ENCOUNTER — Inpatient Hospital Stay: Payer: Medicare HMO | Admitting: Hematology & Oncology

## 2022-02-05 ENCOUNTER — Encounter: Payer: Self-pay | Admitting: Hematology & Oncology

## 2022-02-05 VITALS — BP 118/61 | HR 80 | Temp 98.4°F | Resp 18 | Ht 61.02 in | Wt 166.1 lb

## 2022-02-05 DIAGNOSIS — Z170 Estrogen receptor positive status [ER+]: Secondary | ICD-10-CM | POA: Diagnosis not present

## 2022-02-05 DIAGNOSIS — Z79811 Long term (current) use of aromatase inhibitors: Secondary | ICD-10-CM | POA: Diagnosis not present

## 2022-02-05 DIAGNOSIS — C50312 Malignant neoplasm of lower-inner quadrant of left female breast: Secondary | ICD-10-CM

## 2022-02-05 DIAGNOSIS — C50912 Malignant neoplasm of unspecified site of left female breast: Secondary | ICD-10-CM | POA: Diagnosis not present

## 2022-02-05 DIAGNOSIS — D563 Thalassemia minor: Secondary | ICD-10-CM | POA: Diagnosis not present

## 2022-02-05 LAB — CMP (CANCER CENTER ONLY)
ALT: 14 U/L (ref 0–44)
AST: 17 U/L (ref 15–41)
Albumin: 4.3 g/dL (ref 3.5–5.0)
Alkaline Phosphatase: 74 U/L (ref 38–126)
Anion gap: 8 (ref 5–15)
BUN: 18 mg/dL (ref 8–23)
CO2: 26 mmol/L (ref 22–32)
Calcium: 9.5 mg/dL (ref 8.9–10.3)
Chloride: 106 mmol/L (ref 98–111)
Creatinine: 0.87 mg/dL (ref 0.44–1.00)
GFR, Estimated: >60 mL/min (ref >60)
Glucose, Bld: 85 mg/dL (ref 70–99)
Potassium: 4.1 mmol/L (ref 3.5–5.1)
Sodium: 140 mmol/L (ref 135–145)
Total Bilirubin: 0.5 mg/dL (ref 0.3–1.2)
Total Protein: 7.7 g/dL (ref 6.5–8.1)

## 2022-02-05 LAB — CBC WITH DIFFERENTIAL (CANCER CENTER ONLY)
Abs Immature Granulocytes: 0.03 10*3/uL (ref 0.00–0.07)
Basophils Absolute: 0.1 10*3/uL (ref 0.0–0.1)
Basophils Relative: 1 %
Eosinophils Absolute: 0.5 10*3/uL (ref 0.0–0.5)
Eosinophils Relative: 5 %
HCT: 43.8 % (ref 36.0–46.0)
Hemoglobin: 13.3 g/dL (ref 12.0–15.0)
Immature Granulocytes: 0 %
Lymphocytes Relative: 30 %
Lymphs Abs: 3 10*3/uL (ref 0.7–4.0)
MCH: 22 pg — ABNORMAL LOW (ref 26.0–34.0)
MCHC: 30.4 g/dL (ref 30.0–36.0)
MCV: 72.5 fL — ABNORMAL LOW (ref 80.0–100.0)
Monocytes Absolute: 0.9 10*3/uL (ref 0.1–1.0)
Monocytes Relative: 9 %
Neutro Abs: 5.6 10*3/uL (ref 1.7–7.7)
Neutrophils Relative %: 55 %
Platelet Count: 295 10*3/uL (ref 150–400)
RBC: 6.04 MIL/uL — ABNORMAL HIGH (ref 3.87–5.11)
RDW: 15.9 % — ABNORMAL HIGH (ref 11.5–15.5)
WBC Count: 10.2 10*3/uL (ref 4.0–10.5)
nRBC: 0 % (ref 0.0–0.2)

## 2022-02-05 LAB — LACTATE DEHYDROGENASE: LDH: 138 U/L (ref 98–192)

## 2022-02-05 NOTE — Progress Notes (Signed)
Hematology and Oncology Follow Up Visit  Bonnie Reed [id number] 1945-11-29 76 y.o. 02/05/2022   Principle Diagnosis:  Invasive ductal carcinoma of the LEFT breast -- Stage 1 (T1cNxM0) --ER+/HER2- -- Oncotype = 3 Alpha thalassemia minor trait  Current Therapy:  S/P LEFT MRM on 07/31/209  Folic acid 1 mg PO daily Femara 2.5 mg p.o. daily -start on 10/03/2021 Prolia 60 mg IM q. 6 months --next dose on 05/12/2022   Interim History:  Bonnie Reed is here today for follow-up.  She is doing quite well.  She really has had no complaints.  She is doing well with the Femara although she says she is losing a little bit of her hair.  She did have a very quite Thanksgiving.  She will be at home with some of her family.  She has had no issues with the thalassemia.  She is on folic acid for the thalassemia.  She has had no bony pain.  She has had no cough or shortness of breath.  She has had no change in bowel or bladder habits.  She has had no leg swelling.  She has had no bleeding.  Her last iron studies that were done back in July showed a ferritin of 118 with an iron saturation of 20%.  Overall, I would say performance status is probably ECOG 1.    Medications:  Allergies as of 02/05/2022       Reactions   Phenylpropanol Other (See Comments)   Unknown/cannot remember        Medication List        Accurate as of February 05, 2022 11:06 AM. If you have any questions, ask your nurse or doctor.          Accu-Chek Softclix Lancets lancets Use to check blood sugar three times daily or as directed. What changed:  how much to take how to take this when to take this   aspirin EC 81 MG tablet Take 81 mg by mouth daily.   atorvastatin 40 MG tablet Commonly known as: LIPITOR Take 1 tablet (40 mg total) by mouth daily.   BD Pen Needle Nano 2nd Gen 32G X 4 MM Misc Generic drug: Insulin Pen Needle   cetirizine 10 MG tablet Commonly known as: ZYRTEC Take 10 mg by mouth daily  as needed for allergies.   cholecalciferol 25 MCG (1000 UNIT) tablet Commonly known as: VITAMIN D3 Take 1,000 Units by mouth daily.   empagliflozin 10 MG Tabs tablet Commonly known as: Jardiance Take 1 tablet (10 mg total) by mouth daily.   fluticasone 50 MCG/ACT nasal spray Commonly known as: FLONASE Place 2 sprays into both nostrils daily as needed for allergies.   folic acid 1 MG tablet Commonly known as: FOLVITE Take 1 tablet (1 mg total) by mouth daily.   gabapentin 100 MG capsule Commonly known as: NEURONTIN Take 100 mg by mouth 2 (two) times daily as needed (pain).   GENTEAL OP Place 1 drop into both eyes daily as needed (dry eyes).   GlucoCom Blood Glucose Monitor Devi 1 each by Other route daily.   ibuprofen 200 MG tablet Commonly known as: ADVIL Take 200 mg by mouth every 6 (six) hours as needed for moderate pain.   letrozole 2.5 MG tablet Commonly known as: FEMARA Take 1 tablet (2.5 mg total) by mouth daily.   Levemir FlexTouch 100 UNIT/ML FlexPen Generic drug: insulin detemir Inject 25 Units into the skin 2 (two) times daily.   Linzess 145  MCG Caps capsule Generic drug: linaclotide Take 1 capsule (145 mcg total) by mouth daily as needed (constipation).   NovoLOG FlexPen 100 UNIT/ML FlexPen Generic drug: insulin aspart INJECT 15 UNITS SUBCUTANEOUSLY THREE TIMES DAILY WITH MEALS   ONE TOUCH DELICA LANCING DEV Misc Use to test 3 times a day Dx:E11.9 What changed:  how much to take how to take this when to take this   Ozempic (1 MG/DOSE) 4 MG/3ML Sopn Generic drug: Semaglutide (1 MG/DOSE) INJECT 1MG SUBCUTANEOUSLY ONCE WEEKLY   pantoprazole 40 MG tablet Commonly known as: PROTONIX Take 1 tablet (40 mg total) by mouth daily.   Prolia 60 MG/ML Sosy injection Generic drug: denosumab Inject into the skin.   traZODone 50 MG tablet Commonly known as: DESYREL Take 0.5-1 tablets (25-50 mg total) by mouth at bedtime as needed for sleep.    valsartan 80 MG tablet Commonly known as: DIOVAN Take 1 tablet (80 mg total) by mouth daily.        Allergies:  Allergies  Allergen Reactions   Phenylpropanol Other (See Comments)    Unknown/cannot remember    Past Medical History, Surgical history, Social history, and Family History were reviewed and updated.  Review of Systems: Review of Systems  Constitutional: Negative.   HENT: Negative.  Negative for hearing loss.   Eyes: Negative.   Respiratory: Negative.    Cardiovascular: Negative.   Gastrointestinal: Negative.   Genitourinary: Negative.   Musculoskeletal: Negative.   Skin: Negative.   Neurological: Negative.   Endo/Heme/Allergies: Negative.   Psychiatric/Behavioral: Negative.       Physical Exam:  height is 5' 1.02" (1.55 m) and weight is 166 lb 1.3 oz (75.3 kg). Her oral temperature is 98.4 F (36.9 C). Her blood pressure is 118/61 and her pulse is 80. Her respiration is 18 and oxygen saturation is 100%.   Wt Readings from Last 3 Encounters:  02/05/22 166 lb 1.3 oz (75.3 kg)  01/26/22 166 lb 6.4 oz (75.5 kg)  12/15/21 175 lb (79.4 kg)   Physical Exam Vitals reviewed.  Constitutional:      Comments: Breast exam shows right breast with no masses, edema or erythema.  There is no right axillary adenopathy.  Left chest wall does show the left mastectomy.  This is healing.  There is no erythema or swelling.  There may be little bit of tenderness at the lateral aspect of the mastectomy.  There is no left axillary lymph nodes.    HENT:     Head: Normocephalic and atraumatic.  Eyes:     Pupils: Pupils are equal, round, and reactive to light.  Cardiovascular:     Rate and Rhythm: Normal rate and regular rhythm.     Heart sounds: Normal heart sounds.     Comments: Cardiac exam shows regular rate and rhythm with normal S1 and S2.  I do not hear any murmurs, rubs or bruits. Pulmonary:     Effort: Pulmonary effort is normal.     Breath sounds: Normal breath  sounds.  Abdominal:     General: Bowel sounds are normal.     Palpations: Abdomen is soft.  Musculoskeletal:        General: No tenderness or deformity. Normal range of motion.     Cervical back: Normal range of motion.     Comments: Extremity shows some mild chronic swelling in the left arm.  There is no swelling in the legs.  Lymphadenopathy:     Cervical: No cervical adenopathy.  Skin:  General: Skin is warm and dry.     Findings: No erythema or rash.  Neurological:     Mental Status: She is alert and oriented to person, place, and time.  Psychiatric:        Behavior: Behavior normal.        Thought Content: Thought content normal.        Judgment: Judgment normal.      Lab Results  Component Value Date   WBC 10.2 02/05/2022   HGB 13.3 02/05/2022   HCT 43.8 02/05/2022   MCV 72.5 (L) 02/05/2022   PLT 295 02/05/2022   Lab Results  Component Value Date   FERRITIN 118 09/24/2021   IRON 85 09/24/2021   TIBC 417 09/24/2021   UIBC 332 09/24/2021   IRONPCTSAT 20 09/24/2021   Lab Results  Component Value Date   RETICCTPCT 1.9 09/24/2021   RBC 6.04 (H) 02/05/2022   No results found for: "KPAFRELGTCHN", "LAMBDASER", "KAPLAMBRATIO" No results found for: "IGGSERUM", "IGA", "IGMSERUM" No results found for: "TOTALPROTELP", "ALBUMINELP", "A1GS", "A2GS", "BETS", "BETA2SER", "GAMS", "MSPIKE", "SPEI"   Chemistry      Component Value Date/Time   NA 140 02/05/2022 0934   NA 141 02/08/2019 1049   K 4.1 02/05/2022 0934   CL 106 02/05/2022 0934   CO2 26 02/05/2022 0934   BUN 18 02/05/2022 0934   BUN 15 02/08/2019 1049   CREATININE 0.87 02/05/2022 0934      Component Value Date/Time   CALCIUM 9.5 02/05/2022 0934   ALKPHOS 74 02/05/2022 0934   AST 17 02/05/2022 0934   ALT 14 02/05/2022 0934   BILITOT 0.5 02/05/2022 0934       Impression and Plan: Bonnie Reed is a very pleasant 76 yo African American female with alpha thalassemia minor trait.  She has remote history of  breast cancer of the left breast.  She now has another breast cancer.  Thankfully, this is early stage.  Even more important is the fact that it is estrogen positive and HER2 negative.  There is a very low Oncotype score.  I think she is doing quite well.  Again, she is on Femara.  We will keep her on Femara for at least 5 years.  I will plan to get her back in another 3 months.  She is going to go to [employer].  She is going with her son his wife and her grandson, who has Down syndrome.  I know this will be a a very special time for her.    Volanda Napoleon, MD 11/16/202311:06 AM

## 2022-02-15 ENCOUNTER — Other Ambulatory Visit: Payer: Self-pay | Admitting: Nurse Practitioner

## 2022-02-18 DIAGNOSIS — Z170 Estrogen receptor positive status [ER+]: Secondary | ICD-10-CM | POA: Diagnosis not present

## 2022-02-18 DIAGNOSIS — C50312 Malignant neoplasm of lower-inner quadrant of left female breast: Secondary | ICD-10-CM | POA: Diagnosis not present

## 2022-02-20 ENCOUNTER — Other Ambulatory Visit: Payer: Self-pay | Admitting: Nurse Practitioner

## 2022-02-20 NOTE — Telephone Encounter (Signed)
Chart supports rx  Last ov: 01/26/22  Next ov: 03/03/22

## 2022-02-23 ENCOUNTER — Ambulatory Visit: Payer: Medicare HMO | Admitting: Nurse Practitioner

## 2022-02-24 ENCOUNTER — Ambulatory Visit: Payer: Self-pay | Admitting: General Surgery

## 2022-03-02 ENCOUNTER — Other Ambulatory Visit: Payer: Self-pay | Admitting: Nurse Practitioner

## 2022-03-03 ENCOUNTER — Ambulatory Visit (INDEPENDENT_AMBULATORY_CARE_PROVIDER_SITE_OTHER): Payer: Medicare HMO | Admitting: Nurse Practitioner

## 2022-03-03 ENCOUNTER — Encounter: Payer: Self-pay | Admitting: Nurse Practitioner

## 2022-03-03 VITALS — BP 138/80 | HR 79 | Temp 98.6°F | Ht 61.0 in | Wt 162.2 lb

## 2022-03-03 DIAGNOSIS — C50312 Malignant neoplasm of lower-inner quadrant of left female breast: Secondary | ICD-10-CM

## 2022-03-03 DIAGNOSIS — E118 Type 2 diabetes mellitus with unspecified complications: Secondary | ICD-10-CM

## 2022-03-03 DIAGNOSIS — E538 Deficiency of other specified B group vitamins: Secondary | ICD-10-CM

## 2022-03-03 DIAGNOSIS — Z170 Estrogen receptor positive status [ER+]: Secondary | ICD-10-CM | POA: Diagnosis not present

## 2022-03-03 DIAGNOSIS — Z794 Long term (current) use of insulin: Secondary | ICD-10-CM

## 2022-03-03 DIAGNOSIS — I10 Essential (primary) hypertension: Secondary | ICD-10-CM

## 2022-03-03 HISTORY — DX: Deficiency of other specified B group vitamins: E53.8

## 2022-03-03 LAB — POCT GLYCOSYLATED HEMOGLOBIN (HGB A1C): Hemoglobin A1C: 6.4 % — AB (ref 4.0–5.6)

## 2022-03-03 MED ORDER — LANTUS SOLOSTAR 100 UNIT/ML ~~LOC~~ SOPN: 20.0000 [IU] | PEN_INJECTOR | Freq: Two times a day (BID) | SUBCUTANEOUS | 99 refills | Status: DC

## 2022-03-03 MED ORDER — CYANOCOBALAMIN 1000 MCG/ML IJ SOLN
1000.0000 ug | Freq: Once | INTRAMUSCULAR | Status: AC
  Administered 2022-03-03: 1000 ug via INTRAMUSCULAR

## 2022-03-03 MED ORDER — NOVOLOG FLEXPEN 100 UNIT/ML ~~LOC~~ SOPN: 10.0000 [IU] | PEN_INJECTOR | Freq: Three times a day (TID) | SUBCUTANEOUS | 0 refills | Status: DC

## 2022-03-03 MED ORDER — OZEMPIC (2 MG/DOSE) 8 MG/3ML ~~LOC~~ SOPN: 2.0000 mg | PEN_INJECTOR | SUBCUTANEOUS | 3 refills | Status: DC

## 2022-03-03 NOTE — Patient Instructions (Signed)
It was great to see you!  Decrease your levemir to 20 units twice a day. I have sent in the lantus to your pharmacy to have on file.   Decrease your novalog to 10 units 2-3 times a day with meals.   Increase your ozempic to '2mg'$  weekly.   Let's follow-up in 2 weeks, sooner if you have concerns.  If a referral was placed today, you will be contacted for an appointment. Please note that routine referrals can sometimes take up to 3-4 weeks to process. Please call our office if you haven't heard anything after this time frame.  Take care,  Vance Peper, NP

## 2022-03-03 NOTE — Assessment & Plan Note (Signed)
Chronic, stable. Continue valsartan '80mg'$  daily.

## 2022-03-03 NOTE — Assessment & Plan Note (Signed)
Chronic, stable. Her A1c today is 6.4%. We are still trying to decrease and get her off her insulin per her goal. Will have her increase the ozempic to '2mg'$  injections weekly and decrease her levemir, which will be changed to lantus in January, to 20 units BID and decrease her novolog to 10 units twice a day with meals. Continue checking sugars and monitoring for low blood sugars. Follow-up in 4 weeks.

## 2022-03-03 NOTE — Assessment & Plan Note (Signed)
S/P mastectomy. She is having recurrent effusions and is following with surgery with plans to have a drain placed this month.

## 2022-03-03 NOTE — Progress Notes (Addendum)
Established Patient Office Visit  Subjective   Patient ID: Bonnie Reed, female    DOB: January 30, 1946  Age: 76 y.o. MRN: [id number]  Chief Complaint  Patient presents with   Follow-up    5 week follow up for diabetes, insurance call her and wants her change her insulin , also she is schedule to hospital for a drainage tube , pt is fasting today for labs    HPI  Bonnie Reed is here to follow-up on diabetes. She is currently taking levemir 25 units twice a day and novolog 15 units twice a day with meals and ozempic '1mg'$  daily. She states her blood sugars have been ranging from 90s-130s. She denies low blood sugars, chest pain, and shortness of breath. She needs her levemir changed to lantus per her insurance starting March 23, 2022.   She has also been having issues with recurrent effusion to her left chest after mastectomy. She states the fluid is now starting to cause pain. She has had this drained 8 times and is now planning to have a drain inserted. She is following with her surgeon. She denies fevers.     ROS See pertinent positives and negatives per HPI.    Objective:     BP 138/80   Pulse 79   Temp 98.6 F (37 C)   Ht '5\' 1"'$  (1.549 m)   Wt 162 lb 3.2 oz (73.6 kg)   LMP 10/17/1991 (Exact Date)   SpO2 97%   BMI 30.65 kg/m  BP Readings from Last 3 Encounters:  03/03/22 138/80  02/05/22 118/61  01/26/22 132/70   Wt Readings from Last 3 Encounters:  03/03/22 162 lb 3.2 oz (73.6 kg)  02/05/22 166 lb 1.3 oz (75.3 kg)  01/26/22 166 lb 6.4 oz (75.5 kg)      Physical Exam Vitals and nursing note reviewed.  Constitutional:      General: She is not in acute distress.    Appearance: Normal appearance.  HENT:     Head: Normocephalic.  Eyes:     Conjunctiva/sclera: Conjunctivae normal.  Cardiovascular:     Rate and Rhythm: Normal rate and regular rhythm.     Pulses: Normal pulses.     Heart sounds: Normal heart sounds.  Pulmonary:     Effort: Pulmonary effort is  normal.     Breath sounds: Normal breath sounds.  Musculoskeletal:     Cervical back: Normal range of motion.  Skin:    General: Skin is warm.  Neurological:     General: No focal deficit present.     Mental Status: She is alert and oriented to person, place, and time.  Psychiatric:        Mood and Affect: Mood normal.        Behavior: Behavior normal.        Thought Content: Thought content normal.        Judgment: Judgment normal.     The 10-year ASCVD risk score (Arnett DK, et al., 2019) is: 24.9%    Assessment & Plan:   Problem List Items Addressed This Visit       Cardiovascular and Mediastinum   Hypertension    Chronic, stable. Continue valsartan '80mg'$  daily.         Endocrine   Type 2 diabetes mellitus with complication, with long-term current use of insulin (HCC) - Primary    Chronic, stable. Her A1c today is 6.4%. We are still trying to decrease and get her off her  insulin per her goal. Will have her increase the ozempic to '2mg'$  injections weekly and decrease her levemir, which will be changed to lantus in January, to 20 units BID and decrease her novolog to 10 units twice a day with meals. Continue checking sugars and monitoring for low blood sugars. Follow-up in 4 weeks.       Relevant Medications   Semaglutide, 2 MG/DOSE, (OZEMPIC, 2 MG/DOSE,) 8 MG/3ML SOPN   insulin glargine (LANTUS SOLOSTAR) 100 UNIT/ML Solostar Pen   NOVOLOG FLEXPEN 100 UNIT/ML FlexPen   Other Relevant Orders   POCT glycosylated hemoglobin (Hb A1C) (Completed)     Other   Malignant neoplasm of lower-inner quadrant of left breast in female, estrogen receptor positive (HCC)    S/P mastectomy. She is having recurrent effusions and is following with surgery with plans to have a drain placed this month.       B12 deficiency    She has a history of low B12 and fatigue, last B12 was 310. Will give a vitamin b12 injection 1,076mg IM today. Will plan for next injection in 2 weeks, then again in  another 2 weeks at her next appointment.        Return in about 4 weeks (around 03/31/2022) for Diabetes, B12.    LCharyl Dancer NP

## 2022-03-03 NOTE — Assessment & Plan Note (Addendum)
She has a history of low B12 and fatigue, last B12 was 310. Will give a vitamin b12 injection 1,032mg IM today. Will plan for next injection in 2 weeks, then again in another 2 weeks at her next appointment.

## 2022-03-10 ENCOUNTER — Other Ambulatory Visit: Payer: Self-pay | Admitting: Nurse Practitioner

## 2022-03-10 ENCOUNTER — Encounter (HOSPITAL_COMMUNITY): Payer: Self-pay | Admitting: General Surgery

## 2022-03-10 ENCOUNTER — Other Ambulatory Visit: Payer: Self-pay

## 2022-03-10 NOTE — Progress Notes (Signed)
Spoke with Bonnie Reed for pre-op call. Bonnie Reed has hx of CAD and CHF. Dr. Agustin Cree is her cardiologist. Her last appt with him was 10/29/21. She denies any recent chest pain or shortness of breath. Bonnie Reed is a type 2 Diabetic. Last A1C was 6.4 on 03/03/22. Bonnie Reed is on Jardiance, instructed her not to take it today and none until after surgery. Bonnie Reed is also on Ozempic, takes every Tuesday. She states she has not taken it today and will not take it. Instructed Bonnie Reed to take 1/2 of her regular dose of Levemir the night before surgery and the morning of surgery. She will take 10 units both time. Bonnie Reed will not take her Humalog the morning of surgery. Instructed Bonnie Reed to check her blood sugar when she wakes up Thursday AM and every 2 hours until she leaves for the hospital. If blood sugar is 70 or below, treat with 1/2 cup of clear juice (apple or cranberry) and recheck blood sugar 15 minutes after drinking juice. If blood sugar continues to be 70 or below, call the Short Stay department and ask to speak to a nurse.  Shower instructions given to Bonnie Reed and she voiced understanding.

## 2022-03-12 ENCOUNTER — Other Ambulatory Visit: Payer: Self-pay

## 2022-03-12 ENCOUNTER — Ambulatory Visit (HOSPITAL_COMMUNITY): Payer: Medicare HMO | Admitting: Anesthesiology

## 2022-03-12 ENCOUNTER — Ambulatory Visit (HOSPITAL_COMMUNITY)
Admission: RE | Admit: 2022-03-12 | Discharge: 2022-03-12 | Disposition: A | Discharge status: Home / Self Care | Payer: Medicare HMO | Attending: General Surgery | Admitting: General Surgery

## 2022-03-12 ENCOUNTER — Encounter (HOSPITAL_COMMUNITY)
Admission: RE | Disposition: A | Discharge status: Home / Self Care | Payer: Self-pay | Source: Home / Self Care | Attending: General Surgery

## 2022-03-12 ENCOUNTER — Encounter (HOSPITAL_COMMUNITY): Payer: Self-pay | Admitting: General Surgery

## 2022-03-12 ENCOUNTER — Ambulatory Visit (HOSPITAL_BASED_OUTPATIENT_CLINIC_OR_DEPARTMENT_OTHER): Payer: Medicare HMO | Admitting: Anesthesiology

## 2022-03-12 DIAGNOSIS — I110 Hypertensive heart disease with heart failure: Secondary | ICD-10-CM | POA: Diagnosis not present

## 2022-03-12 DIAGNOSIS — E1165 Type 2 diabetes mellitus with hyperglycemia: Secondary | ICD-10-CM | POA: Diagnosis not present

## 2022-03-12 DIAGNOSIS — L7634 Postprocedural seroma of skin and subcutaneous tissue following other procedure: Secondary | ICD-10-CM | POA: Diagnosis not present

## 2022-03-12 DIAGNOSIS — C50312 Malignant neoplasm of lower-inner quadrant of left female breast: Secondary | ICD-10-CM | POA: Diagnosis not present

## 2022-03-12 DIAGNOSIS — Y838 Other surgical procedures as the cause of abnormal reaction of the patient, or of later complication, without mention of misadventure at the time of the procedure: Secondary | ICD-10-CM | POA: Diagnosis not present

## 2022-03-12 DIAGNOSIS — M1990 Unspecified osteoarthritis, unspecified site: Secondary | ICD-10-CM | POA: Insufficient documentation

## 2022-03-12 DIAGNOSIS — R69 Illness, unspecified: Secondary | ICD-10-CM | POA: Diagnosis not present

## 2022-03-12 DIAGNOSIS — Z170 Estrogen receptor positive status [ER+]: Secondary | ICD-10-CM | POA: Diagnosis not present

## 2022-03-12 DIAGNOSIS — Z7985 Long-term (current) use of injectable non-insulin antidiabetic drugs: Secondary | ICD-10-CM | POA: Insufficient documentation

## 2022-03-12 DIAGNOSIS — Z7984 Long term (current) use of oral hypoglycemic drugs: Secondary | ICD-10-CM | POA: Diagnosis not present

## 2022-03-12 DIAGNOSIS — Z923 Personal history of irradiation: Secondary | ICD-10-CM | POA: Diagnosis not present

## 2022-03-12 DIAGNOSIS — I2510 Atherosclerotic heart disease of native coronary artery without angina pectoris: Secondary | ICD-10-CM

## 2022-03-12 DIAGNOSIS — Z9012 Acquired absence of left breast and nipple: Secondary | ICD-10-CM | POA: Diagnosis not present

## 2022-03-12 DIAGNOSIS — I509 Heart failure, unspecified: Secondary | ICD-10-CM | POA: Insufficient documentation

## 2022-03-12 DIAGNOSIS — K219 Gastro-esophageal reflux disease without esophagitis: Secondary | ICD-10-CM | POA: Diagnosis not present

## 2022-03-12 DIAGNOSIS — Z794 Long term (current) use of insulin: Secondary | ICD-10-CM | POA: Insufficient documentation

## 2022-03-12 DIAGNOSIS — F32A Depression, unspecified: Secondary | ICD-10-CM | POA: Insufficient documentation

## 2022-03-12 DIAGNOSIS — Z79899 Other long term (current) drug therapy: Secondary | ICD-10-CM | POA: Insufficient documentation

## 2022-03-12 HISTORY — PX: HEMATOMA EVACUATION: SHX5118

## 2022-03-12 LAB — BASIC METABOLIC PANEL
Anion gap: 8 (ref 5–15)
BUN: 12 mg/dL (ref 8–23)
CO2: 24 mmol/L (ref 22–32)
Calcium: 9.2 mg/dL (ref 8.9–10.3)
Chloride: 106 mmol/L (ref 98–111)
Creatinine, Ser: 0.89 mg/dL (ref 0.44–1.00)
GFR, Estimated: >60 mL/min (ref >60)
Glucose, Bld: 108 mg/dL — ABNORMAL HIGH (ref 70–99)
Potassium: 4 mmol/L (ref 3.5–5.1)
Sodium: 138 mmol/L (ref 135–145)

## 2022-03-12 LAB — CBC
HCT: 42.3 % (ref 36.0–46.0)
Hemoglobin: 12.9 g/dL (ref 12.0–15.0)
MCH: 22.2 pg — ABNORMAL LOW (ref 26.0–34.0)
MCHC: 30.5 g/dL (ref 30.0–36.0)
MCV: 72.7 fL — ABNORMAL LOW (ref 80.0–100.0)
Platelets: 309 10*3/uL (ref 150–400)
RBC: 5.82 MIL/uL — ABNORMAL HIGH (ref 3.87–5.11)
RDW: 16.8 % — ABNORMAL HIGH (ref 11.5–15.5)
WBC: 8.8 10*3/uL (ref 4.0–10.5)
nRBC: 0 % (ref 0.0–0.2)

## 2022-03-12 LAB — GLUCOSE, CAPILLARY
Glucose-Capillary: 135 mg/dL — ABNORMAL HIGH (ref 70–99)
Glucose-Capillary: 110 mg/dL — ABNORMAL HIGH (ref 70–99)
Glucose-Capillary: 107 mg/dL — ABNORMAL HIGH (ref 70–99)

## 2022-03-12 SURGERY — EVACUATION HEMATOMA
Anesthesia: Monitor Anesthesia Care | Site: Chest | Laterality: Left

## 2022-03-12 MED ORDER — LACTATED RINGERS IV SOLN: INTRAVENOUS | Status: DC

## 2022-03-12 MED ORDER — ACETAMINOPHEN 500 MG PO TABS
1000.0000 mg | ORAL_TABLET | Freq: Once | ORAL | Status: AC
  Administered 2022-03-12: 1000 mg via ORAL
  Filled 2022-03-12: qty 2

## 2022-03-12 MED ORDER — CHLORHEXIDINE GLUCONATE CLOTH 2 % EX PADS: 6.0000 | MEDICATED_PAD | Freq: Once | CUTANEOUS | Status: DC

## 2022-03-12 MED ORDER — PROPOFOL 10 MG/ML IV BOLUS
INTRAVENOUS | Status: DC | PRN
  Administered 2022-03-12: 20 mg via INTRAVENOUS

## 2022-03-12 MED ORDER — BUPIVACAINE HCL (PF) 0.25 % IJ SOLN
INTRAMUSCULAR | Status: AC
  Filled 2022-03-12: qty 30

## 2022-03-12 MED ORDER — CHLORHEXIDINE GLUCONATE 0.12 % MT SOLN
OROMUCOSAL | Status: AC
  Administered 2022-03-12: 15 mL via OROMUCOSAL
  Filled 2022-03-12: qty 15

## 2022-03-12 MED ORDER — ORAL CARE MOUTH RINSE: 15.0000 mL | Freq: Once | OROMUCOSAL | Status: AC

## 2022-03-12 MED ORDER — FENTANYL CITRATE (PF) 250 MCG/5ML IJ SOLN
INTRAMUSCULAR | Status: DC | PRN
  Administered 2022-03-12: 25 ug via INTRAVENOUS

## 2022-03-12 MED ORDER — OXYCODONE HCL 5 MG PO TABS: 5.0000 mg | ORAL_TABLET | Freq: Four times a day (QID) | ORAL | 0 refills | Status: DC | PRN

## 2022-03-12 MED ORDER — PROPOFOL 500 MG/50ML IV EMUL
INTRAVENOUS | Status: DC | PRN
  Administered 2022-03-12: 125 ug/kg/min via INTRAVENOUS

## 2022-03-12 MED ORDER — FENTANYL CITRATE (PF) 250 MCG/5ML IJ SOLN
INTRAMUSCULAR | Status: AC
  Filled 2022-03-12: qty 5

## 2022-03-12 MED ORDER — BUPIVACAINE HCL 0.25 % IJ SOLN
INTRAMUSCULAR | Status: DC | PRN
  Administered 2022-03-12: 10 mL

## 2022-03-12 MED ORDER — CHLORHEXIDINE GLUCONATE 0.12 % MT SOLN: 15.0000 mL | Freq: Once | OROMUCOSAL | Status: AC

## 2022-03-12 MED ORDER — INSULIN ASPART 100 UNIT/ML IJ SOLN: 0.0000 [IU] | INTRAMUSCULAR | Status: DC | PRN

## 2022-03-12 MED ORDER — 0.9 % SODIUM CHLORIDE (POUR BTL) OPTIME
TOPICAL | Status: DC | PRN
  Administered 2022-03-12: 1000 mL

## 2022-03-12 SURGICAL SUPPLY — 31 items
BAG COUNTER SPONGE SURGICOUNT (BAG) ×1 IMPLANT
BIOPATCH RED 1 DISK 7.0 (GAUZE/BANDAGES/DRESSINGS) IMPLANT
CANISTER SUCT 3000ML PPV (MISCELLANEOUS) IMPLANT
CHLORAPREP W/TINT 26 (MISCELLANEOUS) ×1 IMPLANT
COVER SURGICAL LIGHT HANDLE (MISCELLANEOUS) ×1 IMPLANT
DERMABOND ADVANCED .7 DNX12 (GAUZE/BANDAGES/DRESSINGS) ×1 IMPLANT
DRAIN CHANNEL 19F RND (DRAIN) IMPLANT
DRAPE LAPAROTOMY 100X72 PEDS (DRAPES) ×1 IMPLANT
DRSG TEGADERM 4X4.75 (GAUZE/BANDAGES/DRESSINGS) IMPLANT
ELECT COATED BLADE 2.86 ST (ELECTRODE) ×1 IMPLANT
ELECT REM PT RETURN 9FT ADLT (ELECTROSURGICAL) ×1
ELECTRODE REM PT RTRN 9FT ADLT (ELECTROSURGICAL) ×1 IMPLANT
EVACUATOR SILICONE 100CC (DRAIN) IMPLANT
GLOVE BIO SURGEON STRL SZ7.5 (GLOVE) ×1 IMPLANT
GOWN STRL REUS W/ TWL LRG LVL3 (GOWN DISPOSABLE) ×2 IMPLANT
GOWN STRL REUS W/TWL LRG LVL3 (GOWN DISPOSABLE) ×2
KIT BASIN OR (CUSTOM PROCEDURE TRAY) ×1 IMPLANT
KIT TURNOVER KIT B (KITS) ×1 IMPLANT
NDL HYPO 25GX1X1/2 BEV (NEEDLE) ×1 IMPLANT
NEEDLE HYPO 25GX1X1/2 BEV (NEEDLE) ×1 IMPLANT
NS IRRIG 1000ML POUR BTL (IV SOLUTION) ×1 IMPLANT
PACK GENERAL/GYN (CUSTOM PROCEDURE TRAY) ×1 IMPLANT
PAD ARMBOARD 7.5X6 YLW CONV (MISCELLANEOUS) ×1 IMPLANT
PENCIL SMOKE EVACUATOR (MISCELLANEOUS) ×1 IMPLANT
SPECIMEN JAR SMALL (MISCELLANEOUS) ×1 IMPLANT
SUT ETHILON 2 0 FS 18 (SUTURE) IMPLANT
SUT MNCRL AB 4-0 PS2 18 (SUTURE) ×1 IMPLANT
SUT VIC AB 3-0 SH 27 (SUTURE) ×1
SUT VIC AB 3-0 SH 27XBRD (SUTURE) ×1 IMPLANT
SYR CONTROL 10ML LL (SYRINGE) ×1 IMPLANT
TOWEL GREEN STERILE (TOWEL DISPOSABLE) ×1 IMPLANT

## 2022-03-12 NOTE — Anesthesia Preprocedure Evaluation (Signed)
Anesthesia Evaluation  Patient identified by MRN, date of birth, ID band Patient awake    Reviewed: Allergy & Precautions, NPO status , Patient's Chart, lab work & pertinent test results  Airway Mallampati: II  TM Distance: >3 FB Neck ROM: Full    Dental  (+) Dental Advisory Given, Teeth Intact   Pulmonary neg pulmonary ROS   Pulmonary exam normal breath sounds clear to auscultation       Cardiovascular hypertension, Pt. on medications + CAD and +CHF  Normal cardiovascular exam Rhythm:Regular Rate:Normal  Echo 06/2021 1. Left ventricular ejection fraction, by estimation, is 60 to 65%. The left ventricle has normal function. The left ventricle has no regional wall motion abnormalities. There is moderate concentric left ventricular hypertrophy. Left ventricular diastolic parameters are consistent with Grade I diastolic dysfunction (impaired relaxation).  2. Right ventricular systolic function is normal. The right ventricular size is normal. Tricuspid regurgitation signal is inadequate for assessing PA pressure.  3. The mitral valve is abnormal. Trivial mitral valve regurgitation. Moderate mitral annular calcification.  4. The aortic valve is tricuspid. There is mild calcification of the aortic valve. There is mild thickening of the aortic valve. Aortic valve regurgitation is mild. Aortic valve sclerosis/calcification is present, without any evidence of aortic stenosis.  5. The inferior vena cava is normal in size with greater than 50% respiratory variability, suggesting right atrial pressure of 3 mmHg.    Neuro/Psych  PSYCHIATRIC DISORDERS  Depression    negative neurological ROS     GI/Hepatic Neg liver ROS,GERD  ,,  Endo/Other  diabetes, Poorly Controlled    Renal/GU Renal disease     Musculoskeletal  (+) Arthritis ,    Abdominal  (+) + obese  Peds  Hematology  (+) Blood dyscrasia, anemia   Anesthesia Other  Findings   Reproductive/Obstetrics                             Anesthesia Physical Anesthesia Plan  ASA: 3  Anesthesia Plan: MAC   Post-op Pain Management: Tylenol PO (pre-op)*   Induction:   PONV Risk Score and Plan: 3 and Ondansetron, Dexamethasone, Treatment may vary due to age or medical condition and Propofol infusion  Airway Management Planned: Natural Airway and Simple Face Mask  Additional Equipment: None  Intra-op Plan:   Post-operative Plan:   Informed Consent: I have reviewed the patients History and Physical, chart, labs and discussed the procedure including the risks, benefits and alternatives for the proposed anesthesia with the patient or authorized representative who has indicated his/her understanding and acceptance.     Dental advisory given  Plan Discussed with: CRNA  Anesthesia Plan Comments:         Anesthesia Quick Evaluation

## 2022-03-12 NOTE — H&P (Signed)
MRN: [id number] DOB: 01/24/1946 Subjective   Chief Complaint: Follow-up   History of Present Illness: Bonnie Reed is a 76 y.o. female who is seen today for left breast cancer. The patient is a 76 year old black female who is about 6 months status post left mastectomy for a T1 CNX left breast cancer that was ER and PR positive and HER2 negative with a Ki-67 of 15%. She tolerated the surgery well. She elected for mastectomy because of her previous history of breast cancer and radiation on that side. She has developed more seroma fluid since her last visit. She denies any chest wall pain.   Review of Systems: A complete review of systems was obtained from the patient. I have reviewed this information and discussed as appropriate with the patient. See HPI as well for other ROS.  ROS   Medical History: Past Medical History:  Diagnosis Date  Abnormal nuclear stress test 10/25  QQI:WLNL ischemia, mid and distal LAD, normal ef some mild distal anteror hypo  Anemia  Breast cancer (CMS-HCC)  sp lumpectomy and radiation  Depression  Diabetes mellitus type 2, uncomplicated (CMS-HCC)  Eczema, unspecified  Encounter for blood transfusion  Gastroesophageal reflux  Goiter  neck  Hx of cardiac cath  11/12:DRH:Mild moderate mid distal LAD small vessel, on the small end for a stent.  Hypertension  Kidney stones  Osteoarthritis  Thyroid goiter   Patient Active Problem List  Diagnosis  Osteoarthritis  Hypertension  Diabetes mellitus type 2, uncomplicated (CMS-HCC)  Depression  Encounter for blood transfusion  Kidney stones  Anemia  Eczema, unspecified  Thyroid goiter  Gastroesophageal reflux  Abnormal nuclear stress test  CAD (coronary artery disease)  Breast cancer (CMS-HCC)  Cervicalgia  Lumbago  Cervical spondylosis without myelopathy  Lumbosacral spondylosis without myelopathy  Hx of cardiac cath  Malignant neoplasm of lower-inner quadrant of left breast in female, estrogen  receptor positive  Enlarged thyroid   Past Surgical History:  Procedure Laterality Date  APPENDECTOMY 03/23/1954  CHOLECYSTECTOMY 03/23/1970  Cyst removed (R) wrist 03/24/2003  Pseudomonas (R) foot 03/23/2005  BREAST LUMPECTOMY  CESAREAN SECTION  MASTECTOMY  right total hip replacement    Allergies  Allergen Reactions  Hay Fever & Allergy Relief [Chlorpheniramine-Phenylpropan] Itching   Current Outpatient Medications on File Prior to Visit  Medication Sig Dispense Refill  cetirizine (ZYRTEC) 10 MG tablet Take 10 mg by mouth daily.  cholecalciferol, vitamin D3, (VITAMIN D3) 125 mcg (5,000 unit) tablet Take 1 tablet by mouth daily.  ciprofloxacin (CIPRO) 500 MG tablet  denosumab (PROLIA) 60 mg/mL inj syringe Inject subcutaneously  esomeprazole magnesium (NEXIUM) 10 mg packet Take 10 mg by mouth continuously as needed.  FLUoxetine (PROZAC) 20 MG tablet Take 20 mg by mouth daily.  fluticasone propionate (FLONASE) 50 mcg/actuation nasal spray Place 2 sprays into both nostrils once daily  folic acid (FOLVITE) 1 MG tablet Take by mouth  glipiZIDE (GLUCOTROL) 10 MG tablet Take 10 mg by mouth 2 (two) times daily  hydroCHLOROthiazide (HYDRODIURIL) 25 MG tablet Take 25 mg by mouth once daily  HYDROcodone-acetaminophen (VICODIN) 5-500 mg tablet  insulin ASPART (NOVOLOG FLEXPEN) pen injector (concentration 100 units/mL) Inject subcutaneously  insulin DETEMIR (LEVEMIR FLEXPEN) pen injector (concentration 100 units/mL) Inject subcutaneously  JARDIANCE 10 mg tablet Take 10 mg by mouth every morning  letrozole (FEMARA) 2.5 mg tablet Take by mouth  metFORMIN (GLUCOPHAGE) 1000 MG tablet Take 1,000 mg by mouth 2 (two) times daily with meals.  oxyCODONE (ROXICODONE) 5 MG immediate release tablet Take 1  tablet (5 mg total) by mouth every 4 (four) hours as needed for Pain 10 tablet 0  OZEMPIC 0.25 mg or 0.5 mg (2 mg/3 mL) pen injector INJECT 0.25MG INTO THE SKIN ONCE A WEEK. START WITH 0.25MG ONCE A  WEEK FOR 4 WEEKS, THEN INCREASE TO 0.5MG WEEKLY  valsartan (DIOVAN) 80 MG tablet Take 80 mg by mouth daily.  zolpidem (AMBIEN) 10 mg tablet Take 10 mg by mouth nightly as needed.   Current Facility-Administered Medications on File Prior to Visit  Medication Dose Route Frequency Provider Last Rate Last Admin  regadenoson (LEXISCAN) inj syringe 0.4 mg 0.4 mg Intravenous Once Caralyn Guile, MD   Family History  Problem Relation Age of Onset  Diabetes Mother 43  alive  Alzheimer's disease Mother  Stroke Mother  High blood pressure (Hypertension) Mother  Lung cancer Father 55  Deceased  Diabetes Sister  Depression Sister  Arthritis Sister  Asthma Sister  High blood pressure (Hypertension) Sister  Osteoporosis (Thinning of bones) Sister  Hyperthyroidism Child  Heart disease Other  Kidney disease Other  Osteoporosis (Thinning of bones) Other  Stroke Other  Arthritis Other  Cervical cancer Other  Diabetes Other  Myocardial Infarction (Heart attack) Other    Social History   Tobacco Use  Smoking Status Never  Smokeless Tobacco Not on file    Social History   Socioeconomic History  Marital status: Divorced  Tobacco Use  Smoking status: Never  Substance and Sexual Activity  Alcohol use: Yes  Comment: Occasional  Drug use: No   Objective:   There were no vitals filed for this visit.  There is no height or weight on file to calculate BMI.  Physical Exam Vitals reviewed.  Constitutional:  General: She is not in acute distress. Appearance: Normal appearance.  HENT:  Head: Normocephalic and atraumatic.  Right Ear: External ear normal.  Left Ear: External ear normal.  Nose: Nose normal.  Mouth/Throat:  Mouth: Mucous membranes are moist.  Pharynx: Oropharynx is clear.  Eyes:  General: No scleral icterus. Extraocular Movements: Extraocular movements intact.  Conjunctiva/sclera: Conjunctivae normal.  Pupils: Pupils are equal, round, and reactive to  light.  Cardiovascular:  Rate and Rhythm: Normal rate and regular rhythm.  Pulses: Normal pulses.  Heart sounds: Normal heart sounds.  Pulmonary:  Effort: Pulmonary effort is normal. No respiratory distress.  Breath sounds: Normal breath sounds.  Abdominal:  General: Bowel sounds are normal.  Palpations: Abdomen is soft.  Tenderness: There is no abdominal tenderness.  Musculoskeletal:  General: No swelling, tenderness or deformity. Normal range of motion.  Cervical back: Normal range of motion and neck supple.  Skin: General: Skin is warm and dry.  Coloration: Skin is not jaundiced.  Neurological:  General: No focal deficit present.  Mental Status: She is alert and oriented to person, place, and time.  Psychiatric:  Mood and Affect: Mood normal.  Behavior: Behavior normal.     Breast: The left mastectomy incision is healing nicely with no sign of infection or seroma. The skin flaps are healthy. The left chest wall was prepped with ChloraPrep and infiltrated with 1% lidocaine. I was able to aspirate approximately 120 cc of watery light red fluid from the mastectomy site. She tolerated this well.  Labs, Imaging and Diagnostic Testing:  Assessment and Plan:   Diagnoses and all orders for this visit:  Malignant neoplasm of lower-inner quadrant of left breast in female, estrogen receptor positive     The patient is about 6 months status  post left mastectomy for breast cancer. She tolerated the surgery well. I was able to aspirate seroma fluid from beneath the mastectomy incision today and she tolerated this well. She will continue to perform her normal activities but she will wear a compression top for the next several days and try to avoid overhead activity with the left arm for the next several days. I will plan to see her back in about 1 month to check for any residual fluid. I also talked to her about placing a new drain in the cavity and she will think about it and let me know  if she would be agreeable to that

## 2022-03-12 NOTE — Transfer of Care (Signed)
Immediate Anesthesia Transfer of Care Note  Patient: Bonnie Reed  Procedure(s) Performed: PLACEMENT OF DRAIN LEFT CHEST WALL (Left: Chest)  Patient Location: PACU  Anesthesia Type:MAC  Level of Consciousness: drowsy and patient cooperative  Airway & Oxygen Therapy: Patient Spontanous Breathing  Post-op Assessment: Report given to RN and Post -op Vital signs reviewed and stable  Post vital signs: Reviewed and stable  Last Vitals:  Vitals Value Taken Time  BP 142/56 03/12/22 1335  Temp    Pulse 69 03/12/22 1337  Resp 11 03/12/22 1337  SpO2 97 % 03/12/22 1337  Vitals shown include unvalidated device data.  Last Pain:  Vitals:   03/12/22 1046  TempSrc: Oral  PainSc:          Complications: No notable events documented.

## 2022-03-12 NOTE — Op Note (Signed)
03/12/2022  1:27 PM  PATIENT:  Bonnie Reed  76 y.o. female  PRE-OPERATIVE DIAGNOSIS:  LEFT CHEST WALL SEROMA  POST-OPERATIVE DIAGNOSIS:  LEFT CHEST WALL SEROMA  PROCEDURE:  Procedure(s): PLACEMENT OF DRAIN LEFT CHEST WALL (Left)  SURGEON:  Surgeon(s) and Role:    * Jovita Kussmaul, MD - Primary  PHYSICIAN ASSISTANT:   ASSISTANTS: none   ANESTHESIA:   local and IV sedation  EBL:  minimal   BLOOD ADMINISTERED:none  DRAINS: (1) Blake drain(s) in the prepectoral space    LOCAL MEDICATIONS USED:  MARCAINE     SPECIMEN:  No Specimen  DISPOSITION OF SPECIMEN:  N/A  COUNTS:  YES  TOURNIQUET:  * No tourniquets in log *  DICTATION: .Dragon Dictation  After informed consent was obtained the patient was brought to the operating room and placed in the supine position on the operating table.  After adequate IV sedation had been given the patient's left chest wall was prepped with ChloraPrep, allowed to dry, and draped in usual sterile manner.  An appropriate timeout was performed.  The lateral portion of the left chest wall inferior to the seroma was infiltrated with 1% Marcaine.  A small incision was made lateral and inferior to the seroma with a 15 blade knife.  The hemostat was used to bluntly dissected through this opening until the seroma was accessed.  I placed a 17 French round Blake drain into the seroma cavity.  The drain was anchored to the skin with a 3-0 nylon stitch.  The drain was placed to bulb suction and there was a good seal.  The seroma was completely evacuated.  Sterile drain dressings were then applied.  The patient tolerated the procedure well.  At the end of the case all needle sponge and instrument counts were correct.  The patient was then awakened and taken to recovery in stable condition.  PLAN OF CARE: Discharge to home after PACU  PATIENT DISPOSITION:  PACU - hemodynamically stable.   Delay start of Pharmacological VTE agent (>24hrs) due to surgical blood  loss or risk of bleeding: not applicable

## 2022-03-12 NOTE — Interval H&P Note (Signed)
History and Physical Interval Note:  03/12/2022 12:27 PM  Bonnie Reed  has presented today for surgery, with the diagnosis of LEFT CHEST WALL SEROMA.  The various methods of treatment have been discussed with the patient and family. After consideration of risks, benefits and other options for treatment, the patient has consented to  Procedure(s): PLACEMENT OF DRAIN LEFT CHEST WALL (Left) as a surgical intervention.  The patient's history has been reviewed, patient examined, no change in status, stable for surgery.  I have reviewed the patient's chart and labs.  Questions were answered to the patient's satisfaction.     [name]

## 2022-03-13 ENCOUNTER — Telehealth: Payer: Self-pay

## 2022-03-13 ENCOUNTER — Encounter (HOSPITAL_COMMUNITY): Payer: Self-pay | Admitting: General Surgery

## 2022-03-13 NOTE — Anesthesia Postprocedure Evaluation (Signed)
Anesthesia Post Note  Patient: Bonnie Reed  Procedure(s) Performed: PLACEMENT OF DRAIN LEFT CHEST WALL (Left: Chest)     Patient location during evaluation: PACU Anesthesia Type: MAC Level of consciousness: awake and alert Pain management: pain level controlled Vital Signs Assessment: post-procedure vital signs reviewed and stable Respiratory status: spontaneous breathing, nonlabored ventilation, respiratory function stable and patient connected to nasal cannula oxygen Cardiovascular status: stable and blood pressure returned to baseline Postop Assessment: no apparent nausea or vomiting Anesthetic complications: no   No notable events documented.  Last Vitals:  Vitals:   03/12/22 1400 03/12/22 1415  BP: (!) 183/68 (!) 163/69  Pulse: 63 61  Resp: 13 14  Temp:  36.5 C  SpO2: 99% 100%    Last Pain:  Vitals:   03/12/22 1336  TempSrc:   PainSc: 0-No pain                 Tiajuana Amass

## 2022-03-13 NOTE — Telephone Encounter (Signed)
Transition Care Management Follow-up Telephone Call Date of discharge and from where: 03/12/22. [facility] Sx: Placement of Left Chest Wall Drain How have you been since you were released from the hospital? I'm doing good. Any questions or concerns? No  Items Reviewed: Did the pt receive and understand the discharge instructions provided? Yes  Medications obtained and verified? Yes  Other? No  Any new allergies since your discharge? No  Dietary orders reviewed? No Do you have support at home? Yes   Home Care and Equipment/Supplies: Were home health services ordered? not applicable If so, what is the name of the agency? N/a  Has the agency set up a time to come to the patient's home? not applicable Were any new equipment or medical supplies ordered?  No What is the name of the medical supply agency? N/a Were you able to get the supplies/equipment? not applicable Do you have any questions related to the use of the equipment or supplies? No  Functional Questionnaire: (I = Independent and D = Dependent) ADLs: I  Bathing/Dressing- I  Meal Prep- I  Eating- I  Maintaining continence- I  Transferring/Ambulation- I  Managing Meds- I  Follow up appointments reviewed:  PCP Hospital f/u appt confirmed? No  Scheduled to see n/a on n/a @ n/a. [facility] f/u appt confirmed? Yes  Scheduled to see Dr. Autumn Messing on unknown @ unknown. Are transportation arrangements needed? No  If their condition worsens, is the pt aware to call PCP or go to the Emergency Dept.? Yes Was the patient provided with contact information for the PCP's office or ED? Yes Was to pt encouraged to call back with questions or concerns? Yes  Angeline Slim, RN, BSN RN Clinical Supervisor [facility] [employer]

## 2022-03-17 ENCOUNTER — Other Ambulatory Visit: Payer: Self-pay | Admitting: Nurse Practitioner

## 2022-03-19 ENCOUNTER — Ambulatory Visit (INDEPENDENT_AMBULATORY_CARE_PROVIDER_SITE_OTHER): Payer: Medicare HMO

## 2022-03-19 DIAGNOSIS — E538 Deficiency of other specified B group vitamins: Secondary | ICD-10-CM

## 2022-03-19 MED ORDER — CYANOCOBALAMIN 1000 MCG/ML IJ SOLN
1000.0000 ug | Freq: Once | INTRAMUSCULAR | Status: AC
  Administered 2022-03-19: 1000 ug via INTRAMUSCULAR

## 2022-03-19 NOTE — Progress Notes (Addendum)
Per orders of Vance Peper NP pt is here for b12 injection. Pt received b12 injection in Right deltoid  at 9:15 am. Given by [place] L . CMA/CPT. Pt tolerated B12 injection well. Pt will return in 1 week to get next b12 injection.

## 2022-04-03 ENCOUNTER — Other Ambulatory Visit: Payer: Self-pay | Admitting: Nurse Practitioner

## 2022-04-08 DIAGNOSIS — C50912 Malignant neoplasm of unspecified site of left female breast: Secondary | ICD-10-CM | POA: Diagnosis not present

## 2022-04-13 ENCOUNTER — Other Ambulatory Visit: Payer: Self-pay | Admitting: Nurse Practitioner

## 2022-04-14 DIAGNOSIS — Z170 Estrogen receptor positive status [ER+]: Secondary | ICD-10-CM | POA: Diagnosis not present

## 2022-04-14 DIAGNOSIS — C50312 Malignant neoplasm of lower-inner quadrant of left female breast: Secondary | ICD-10-CM | POA: Diagnosis not present

## 2022-04-20 ENCOUNTER — Ambulatory Visit (INDEPENDENT_AMBULATORY_CARE_PROVIDER_SITE_OTHER): Payer: Medicare HMO

## 2022-04-20 ENCOUNTER — Telehealth: Payer: Self-pay

## 2022-04-20 ENCOUNTER — Other Ambulatory Visit: Payer: Self-pay | Admitting: Nurse Practitioner

## 2022-04-20 VITALS — Ht 61.0 in | Wt 157.0 lb

## 2022-04-20 DIAGNOSIS — Z0000 Encounter for general adult medical examination without abnormal findings: Secondary | ICD-10-CM | POA: Diagnosis not present

## 2022-04-20 NOTE — Telephone Encounter (Signed)
   Telephone encounter was:  Successful.  04/20/2022 Name: Bonnie Reed MRN: [id number] DOB: 1945-09-08  Bonnie Reed is a 77 y.o. year old female who is a primary care patient of McElwee, Lauren A, NP . The community resource team was consulted for assistance with Food Insecurity and Financial Difficulties related to [place] guide performed the following interventions: Patient provided with information about care guide support team and interviewed to confirm resource needs.Patient is having financial strain and cant afford to buy food and pay utilities. Utilities may be cut off and losing her home if she dont get assistance. I have mailed and gave resources over the phone and added referrals in [place] for food & MOW   Follow Up Plan:  No further follow up planned at this time. The patient has been provided with needed resources.    [place], Care Management  [phone] [address], [place], [place] [address] Phone: [phone] Email: [email or web address]

## 2022-04-20 NOTE — Patient Instructions (Signed)
Bonnie Reed , Thank you for taking time to come for your Medicare Wellness Visit. I appreciate your ongoing commitment to your health goals. Please review the following plan we discussed and let me know if I can assist you in the future.   These are the goals we discussed:  Goals       Patient Stated      She will more safe and independent in going up and down her stairs to basement for laundry with an additional hand rail.    ACTION PLANNING - CUSTOM  Target Problem Area: Safety with laundry  Why Problem May Occur: Laundry is down stairs, orginally only one hand rail to go up /down stairs. Now there are two.        Target Goal: Safety and independence up and down steps to laundry room   STRATEGIES Saving Your Energy: DO: DON'T:  Plan to do laundry on a day that nothing else is scheduled Be in a rush to do laundry  Sit fold laundry            Modifying your home environment and making it safe: DO: DON'T:  Have an extra hand rail installed for up.down steps to laundry room Only use one hand rail up and down steps  Use strap on your cane to place can on your wrist so that you can hold onto both handrails equally going up and down the steps Try and hold cane in your hand while going up and down the steps  Consider using a wheeled cart to take your laundry to basement via ramp and sidewalk to outdoor entrance on nice days so don;t need to use steps all the time.. Use wheeled cart up and down the steps.        Practice It is important to practice the strategies so we can determine if they will be effective in helping to reach your goal. Follow these specific recommendations: 1.Use both hand rails going up and down steps (not hand rail and cane) 2. Place cane strap on your wrist when you go up and down steps   If a strategy does not work the first time, try it again and again (and maybe again). We may make some changes over the next few sessions, based on how they  work.   [facility] [facility] Therapist [phone]             Patient Stated (pt-stated)      CLIENT/RN ACTION PLAN - PAIN  Registered Nurse:  Reginia Naas RN Date:April 19, 2019  Client Name: Bonnie Reed Client ID:    Target Area:  PAIN   Why Problem May Occur:  Chronic (R) hip pain/ (R) lower extremity arthritis    Target Goal: "Manage my (R) hip pain so I can stay as independent as possible and do the things I need and want to do"    STRATEGIES Coping Strategies Ideas:  Heat Use heating pad or warm towel on painful area no more than 20 minutes at a time. Don't sleep with a heating pad on - it could burn your skin Warm showers  Ice Use ice pack or frozen bag of vegetables on painful area.   Leave cold pack on for less than 20 minutes. Ice can burn your skin.  Don't leave ice on longer than 20 minutes.   Activity and Exercise Joints get stiff when not in use Aging Gracefully Exercises Walking (inside or outside) [employer]:  cooking, cleaning,  Psychologist, forensic Walking dogs when weather is nice Consider online exercise programs as we discussed  Listen to Music Listening to music can decrease pain. Turn off the TV and turn on the radio.   Prayer/Meditation Prayer and meditation can decrease pain   Other   Acetaminophen/Tylenol (same medication) DO NOT take more Acetaminophen than below because it can be bad for your liver. 500 mg. tablets:  2 tablets every 8 hours, as needed for pain.  Do not take more than 6, (500 mg) tablets every day. 300 mg. tablets:  2 tablets every 6 hours, as needed for pain.  Do not take more than 8 (325 mg) tablets every day. Look for Acetaminophen/Tylenol in other medicines you buy over the counter.   Still in Pain There ae a lot of different kinds of pain medicines:  creams, patches and supplements. Ask your Healthcare Provider about other pain medications.   Stop Smoking Smoking can make arthritis  worse.   Stop Pain Before it gets bad. Once in pain It is harder to get rid of. Begin pain relief while you have mild pain.   Other   Other   ;  PRACTICE It is important to practice the strategies so we can determine if they will be effective in helping to reach the goal.    Follow these specific recommendations:  -- now that you have start doing the Aging Gracefully exercises we discussed during our first visit, try to gradually increase the time and amount you do each week; always start conservatively and gradually build up; do not do any exercises that cause you pain, and immediately stop if you experience pain while exercising -- consider doing online exercise programs for seniors, as we discussed -- consider starting to walk your dogs again when weather is nice, if you feel comfortable walking outside while following standard precautions for corona virus -- consider making an appointment with an orthopedic specialist to evaluate your pain -- consider re-positioning your computer desk top monitor to make sure your body is in good alignment, without twisting; try to keep your arms at your side in natural position for typing, as we discussed during our first visit: I am so glad to hear that this strategy was effective!  :) -- call your doctor if there is a change in your usual pain level/ if pain increases -- notify your doctor if you experience any new falls, even if you are not seriously hurt; continue using your cane as needed and proactively take your cane with you, even if think you may need to use it-- so you will have it, in case you do -- try not to rush at any time; take your time and move slowly and deliberately     If strategy does not work the first time, try it again.  We may make some changes over the next few sessions.    We may make some changes over the next few sessions, based on how they work.  Oneta Rack, RN, BSN, [employer] Coordinator [facility]  Care Management  [phone]          This is a list of the screening recommended for you and due dates:  Health Maintenance  Topic Date Due   Medicare Annual Wellness Visit  07/10/2022   Hemoglobin A1C  09/02/2022   Complete foot exam   10/25/2022   Eye exam for diabetics  10/30/2022   Yearly kidney health urinalysis for diabetes  01/27/2023  Yearly kidney function blood test for diabetes  03/13/2023   Colon Cancer Screening  02/13/2024   DTaP/Tdap/Td vaccine (2 - Td or Tdap) 11/14/2030   Pneumonia Vaccine  Completed   Flu Shot  Completed   DEXA scan (bone density measurement)  Completed   Hepatitis C Screening: USPSTF Recommendation to screen - Ages 75-79 yo.  Completed   Zoster (Shingles) Vaccine  Completed   HPV Vaccine  Aged Out   COVID-19 Vaccine  Discontinued    Advanced directives: Advance directive discussed with you today.   Conditions/risks identified: none  Next appointment: Follow up in one year for your annual wellness visit    Preventive Care 65 Years and Older, Female Preventive care refers to lifestyle choices and visits with your health care provider that can promote health and wellness. What does preventive care include? A yearly physical exam. This is also called an annual well check. Dental exams once or twice a year. Routine eye exams. Ask your health care provider how often you should have your eyes checked. Personal lifestyle choices, including: Daily care of your teeth and gums. Regular physical activity. Eating a healthy diet. Avoiding tobacco and drug use. Limiting alcohol use. Practicing safe sex. Taking low-dose aspirin every day. Taking vitamin and mineral supplements as recommended by your health care provider. What happens during an annual well check? The services and screenings done by your health care provider during your annual well check will depend on your age, overall health, lifestyle risk factors, and family history of  disease. Counseling  Your health care provider may ask you questions about your: Alcohol use. Tobacco use. Drug use. Emotional well-being. Home and relationship well-being. Sexual activity. Eating habits. History of falls. Memory and ability to understand (cognition). Work and work Statistician. Reproductive health. Screening  You may have the following tests or measurements: Height, weight, and BMI. Blood pressure. Lipid and cholesterol levels. These may be checked every 5 years, or more frequently if you are over 67 years old. Skin check. Lung cancer screening. You may have this screening every year starting at age 42 if you have a 30-pack-year history of smoking and currently smoke or have quit within the past 15 years. Fecal occult blood test (FOBT) of the stool. You may have this test every year starting at age 61. Flexible sigmoidoscopy or colonoscopy. You may have a sigmoidoscopy every 5 years or a colonoscopy every 10 years starting at age 44. Hepatitis C blood test. Hepatitis B blood test. Sexually transmitted disease (STD) testing. Diabetes screening. This is done by checking your blood sugar (glucose) after you have not eaten for a while (fasting). You may have this done every 1-3 years. Bone density scan. This is done to screen for osteoporosis. You may have this done starting at age 57. Mammogram. This may be done every 1-2 years. Talk to your health care provider about how often you should have regular mammograms. Talk with your health care provider about your test results, treatment options, and if necessary, the need for more tests. Vaccines  Your health care provider may recommend certain vaccines, such as: Influenza vaccine. This is recommended every year. Tetanus, diphtheria, and acellular pertussis (Tdap, Td) vaccine. You may need a Td booster every 10 years. Zoster vaccine. You may need this after age 87. Pneumococcal 13-valent conjugate (PCV13) vaccine. One  dose is recommended after age 68. Pneumococcal polysaccharide (PPSV23) vaccine. One dose is recommended after age 55. Talk to your health care provider about which screenings  and vaccines you need and how often you need them. This information is not intended to replace advice given to you by your health care provider. Make sure you discuss any questions you have with your health care provider. Document Released: 04/05/2015 Document Revised: 11/27/2015 Document Reviewed: 01/08/2015 Elsevier Interactive Patient Education  2017 [place] Prevention in the Home Falls can cause injuries. They can happen to people of all ages. There are many things you can do to make your home safe and to help prevent falls. What can I do on the outside of my home? Regularly fix the edges of walkways and driveways and fix any cracks. Remove anything that might make you trip as you walk through a door, such as a raised step or threshold. Trim any bushes or trees on the path to your home. Use bright outdoor lighting. Clear any walking paths of anything that might make someone trip, such as rocks or tools. Regularly check to see if handrails are loose or broken. Make sure that both sides of any steps have handrails. Any raised decks and porches should have guardrails on the edges. Have any leaves, snow, or ice cleared regularly. Use sand or salt on walking paths during winter. Clean up any spills in your garage right away. This includes oil or grease spills. What can I do in the bathroom? Use night lights. Install grab bars by the toilet and in the tub and shower. Do not use towel bars as grab bars. Use non-skid mats or decals in the tub or shower. If you need to sit down in the shower, use a plastic, non-slip stool. Keep the floor dry. Clean up any water that spills on the floor as soon as it happens. Remove soap buildup in the tub or shower regularly. Attach bath mats securely with double-sided  non-slip rug tape. Do not have throw rugs and other things on the floor that can make you trip. What can I do in the bedroom? Use night lights. Make sure that you have a light by your bed that is easy to reach. Do not use any sheets or blankets that are too big for your bed. They should not hang down onto the floor. Have a firm chair that has side arms. You can use this for support while you get dressed. Do not have throw rugs and other things on the floor that can make you trip. What can I do in the kitchen? Clean up any spills right away. Avoid walking on wet floors. Keep items that you use a lot in easy-to-reach places. If you need to reach something above you, use a strong step stool that has a grab bar. Keep electrical cords out of the way. Do not use floor polish or wax that makes floors slippery. If you must use wax, use non-skid floor wax. Do not have throw rugs and other things on the floor that can make you trip. What can I do with my stairs? Do not leave any items on the stairs. Make sure that there are handrails on both sides of the stairs and use them. Fix handrails that are broken or loose. Make sure that handrails are as long as the stairways. Check any carpeting to make sure that it is firmly attached to the stairs. Fix any carpet that is loose or worn. Avoid having throw rugs at the top or bottom of the stairs. If you do have throw rugs, attach them to the floor with carpet tape. Make  sure that you have a light switch at the top of the stairs and the bottom of the stairs. If you do not have them, ask someone to add them for you. What else can I do to help prevent falls? Wear shoes that: Do not have high heels. Have rubber bottoms. Are comfortable and fit you well. Are closed at the toe. Do not wear sandals. If you use a stepladder: Make sure that it is fully opened. Do not climb a closed stepladder. Make sure that both sides of the stepladder are locked into place. Ask  someone to hold it for you, if possible. Clearly mark and make sure that you can see: Any grab bars or handrails. First and last steps. Where the edge of each step is. Use tools that help you move around (mobility aids) if they are needed. These include: Canes. Walkers. Scooters. Crutches. Turn on the lights when you go into a dark area. Replace any light bulbs as soon as they burn out. Set up your furniture so you have a clear path. Avoid moving your furniture around. If any of your floors are uneven, fix them. If there are any pets around you, be aware of where they are. Review your medicines with your doctor. Some medicines can make you feel dizzy. This can increase your chance of falling. Ask your doctor what other things that you can do to help prevent falls. This information is not intended to replace advice given to you by your health care provider. Make sure you discuss any questions you have with your health care provider. Document Released: 01/03/2009 Document Revised: 08/15/2015 Document Reviewed: 04/13/2014 Elsevier Interactive Patient Education  2017 [employer].

## 2022-04-20 NOTE — Addendum Note (Signed)
Addended by: Kellie Simmering on: 04/20/2022 09:08 AM   Modules accepted: Orders

## 2022-04-20 NOTE — Progress Notes (Signed)
I connected with Bonnie Reed today by telephone and verified that I am speaking with the correct person using two identifiers. Location patient: home Location provider: work Persons participating in the virtual visit: Merrillyn Emilyn, Ruble LPN.   I discussed the limitations, risks, security and privacy concerns of performing an evaluation and management service by telephone and the availability of in person appointments. I also discussed with the patient that there may be a patient responsible charge related to this service. The patient expressed understanding and verbally consented to this telephonic visit.    Interactive audio and video telecommunications were attempted between this provider and patient, however failed, due to patient having technical difficulties OR patient did not have access to video capability.  We continued and completed visit with audio only.     Vital signs may be patient reported or missing.  Subjective:   Bonnie Reed is a 77 y.o. female who presents for an Initial Medicare Annual Wellness Visit.  Review of Systems     Cardiac Risk Factors include: advanced age (>53mn, >>65women);diabetes mellitus;dyslipidemia;hypertension     Objective:    Today's Vitals   04/20/22 0841  Weight: 157 lb (71.2 kg)  Height: '5\' 1"'$  (1.549 m)   Body mass index is 29.66 kg/m.     04/20/2022    8:47 AM 03/12/2022   10:46 AM 02/05/2022   10:23 AM 11/05/2021   11:35 AM 09/24/2021   11:59 AM 08/25/2021    6:54 PM 08/25/2021    9:58 AM  Advanced Directives  Does Patient Have a Medical Advance Directive? No No No No No  No  Would patient like information on creating a medical advance directive?  No - Patient declined No - Patient declined No - Patient declined No - Patient declined Yes (Inpatient - patient requests chaplain consult to create a medical advance directive)     Current Medications (verified) Outpatient Encounter Medications as of 04/20/2022  Medication Sig    ACCU-CHEK SOFTCLIX LANCETS lancets Use to check blood sugar three times daily or as directed. (Patient taking differently: 1 each by Other route See admin instructions. Use to check blood sugar three times daily or as directed.)   aspirin EC 81 MG tablet Take 81 mg by mouth daily.   atorvastatin (LIPITOR) 40 MG tablet Take 1 tablet (40 mg total) by mouth daily.   BD PEN NEEDLE NANO 2ND GEN 32G X 4 MM MISC USE AS DIRECTED THREE TIMES DAILY   Blood Glucose Monitoring Suppl (GLUCOCOM BLOOD GLUCOSE MONITOR) DEVI 1 each by Other route daily.   Carboxymethylcell-Hypromellose (GENTEAL OP) Place 1 drop into both eyes daily as needed (dry eyes).   cetirizine (ZYRTEC) 10 MG tablet Take 10 mg by mouth daily as needed for allergies.   cholecalciferol (VITAMIN D3) 25 MCG (1000 UNIT) tablet Take 1,000 Units by mouth daily.   denosumab (PROLIA) 60 MG/ML SOSY injection Inject into the skin.   empagliflozin (JARDIANCE) 10 MG TABS tablet Take 1 tablet (10 mg total) by mouth daily.   fluticasone (FLONASE) 50 MCG/ACT nasal spray Place 2 sprays into both nostrils daily as needed for allergies.   folic acid (FOLVITE) 1 MG tablet Take 1 tablet (1 mg total) by mouth daily.   gabapentin (NEURONTIN) 100 MG capsule Take 100 mg by mouth 2 (two) times daily as needed (pain).   ibuprofen (ADVIL) 200 MG tablet Take 200 mg by mouth every 6 (six) hours as needed for moderate pain.   insulin glargine (  LANTUS SOLOSTAR) 100 UNIT/ML Solostar Pen Inject 20 Units into the skin 2 (two) times daily.   Lancet Devices (ONE TOUCH DELICA LANCING DEV) MISC Use to test 3 times a day Dx:E11.9 (Patient taking differently: 1 each by Other route See admin instructions. Use to test 3 times a day Dx:E11.9)   letrozole (FEMARA) 2.5 MG tablet Take 1 tablet (2.5 mg total) by mouth daily.   LINZESS 145 MCG CAPS capsule TAKE 1 CAPSULE BY MOUTH ONCE DAILY AS NEEDED FOR CONSTIPATION   NOVOLOG FLEXPEN 100 UNIT/ML FlexPen INJECT 15 UNITS SUBCUTANEOUSLY  THREE TIMES DAILY WITH MEALS   pantoprazole (PROTONIX) 40 MG tablet Take 1 tablet (40 mg total) by mouth daily.   Semaglutide, 2 MG/DOSE, (OZEMPIC, 2 MG/DOSE,) 8 MG/3ML SOPN Inject 2 mg into the skin once a week.   traZODone (DESYREL) 50 MG tablet Take 0.5-1 tablets (25-50 mg total) by mouth at bedtime as needed for sleep.   valsartan (DIOVAN) 80 MG tablet Take 1 tablet (80 mg total) by mouth daily.   oxyCODONE (ROXICODONE) 5 MG immediate release tablet Take 1 tablet (5 mg total) by mouth every 6 (six) hours as needed for severe pain. (Patient not taking: Reported on 04/20/2022)   No facility-administered encounter medications on file as of 04/20/2022.    Allergies (verified) Phenylpropanol   History: Past Medical History:  Diagnosis Date   Allergy    Anemia    during knee replacement 15 years ago   Blood transfusion without reported diagnosis    Breast cancer ([place]) 1997   Left Breast Cancer   Cancer ([place])    Cervical spondylosis    CHF (congestive heart failure) ([place])    Coronary artery disease involving native coronary artery of native heart without angina pectoris    COVID 04/2021   mild case   Diabetes mellitus without complication (HCC)    Diverticulosis    GERD (gastroesophageal reflux disease)    without esophagitis   History of breast cancer    History of kidney stones    Hyperlipidemia    Hypertension    Lumbar spondylosis    Osteoarthritis    right knee   Osteopenia due to cancer therapy 09/30/2021   Other eczema    Personal history of radiation therapy 1997   Left Breast Cancer   Recurrent major depressive disorder ([place])    Thyroid goiter    Past Surgical History:  Procedure Laterality Date   APPENDECTOMY     BREAST BIOPSY Right 08/01/2021   BREAST LUMPECTOMY Left 1997   CESAREAN SECTION     CHOLECYSTECTOMY     HEMATOMA EVACUATION Left 03/12/2022   Procedure: PLACEMENT OF DRAIN LEFT CHEST WALL;  Surgeon: Jovita Kussmaul, MD;  Location: [place];  Service:  General;  Laterality: Left;   KNEE ARTHROSCOPY Right    LESION REMOVAL Left 08/25/2021   Procedure: EXCISION SKIN LESION LEFT CHEST WALL;  Surgeon: Jovita Kussmaul, MD;  Location: [facility];  Service: General;  Laterality: Left;   REDUCTION MAMMAPLASTY Right 1998   SIMPLE MASTECTOMY WITH AXILLARY SENTINEL NODE BIOPSY Left 08/25/2021   Procedure: LEFT MASTECTOMY;  Surgeon: Jovita Kussmaul, MD;  Location[facility];  Service: General;  Laterality: Left;   TOTAL HIP ARTHROPLASTY Right    TUBAL LIGATION     Family History  Problem Relation Age of Onset   Stroke Mother    Kidney disease Mother    Dementia Mother    Cancer Father  lung   Colon cancer Neg Hx    Rectal cancer Neg Hx    Social History   Socioeconomic History   Marital status: Divorced    Spouse name: Not on file   Number of children: 4   Years of education: Not on file   Highest education level: Not on file  Occupational History   Occupation: Retired  Tobacco Use   Smoking status: Never   Smokeless tobacco: Never  Vaping Use   Vaping Use: Never used  Substance and Sexual Activity   Alcohol use: No   Drug use: No   Sexual activity: Not Currently  Other Topics Concern   Not on file  Social History Narrative   Not on file   Social Determinants of Health   Financial Resource Strain: Low Risk  (04/20/2022)   Overall Financial Resource Strain (CARDIA)    Difficulty of Paying Living Expenses: Not hard at all  Food Insecurity: No Food Insecurity (04/20/2022)   Hunger Vital Sign    Worried About Running Out of Food in the Last Year: Never true    [place] in the Last Year: Never true  Transportation Needs: No Transportation Needs (04/20/2022)   PRAPARE - Hydrologist (Medical): No    Lack of Transportation (Non-Medical): No  Physical Activity: Insufficiently Active (04/20/2022)   Exercise Vital Sign    Days of Exercise per Week: 4 days    Minutes of Exercise per Session: 30 min   Stress: No Stress Concern Present (04/20/2022)   [place]    Feeling of Stress : Not at all  Social Connections: Not on file    Tobacco Counseling Counseling given: Not Answered   Clinical Intake:  Pre-visit preparation completed: Yes  Pain : No/denies pain     Nutritional Status: BMI 25 -29 Overweight Nutritional Risks: None Diabetes: Yes  How often do you need to have someone help you when you read instructions, pamphlets, or other written materials from your doctor or pharmacy?: 1 - Never  Diabetic? Yes Nutrition Risk Assessment:  Has the patient had any N/V/D within the last 2 months?  No  Does the patient have any non-healing wounds?  No  Has the patient had any unintentional weight loss or weight gain?  No   Diabetes:  Is the patient diabetic?  Yes  If diabetic, was a CBG obtained today?  No  Did the patient bring in their glucometer from home?  No  How often do you monitor your CBG's? Twice daily.   Financial Strains and Diabetes Management:  Are you having any financial strains with the device, your supplies or your medication? No .  Does the patient want to be seen by Chronic Care Management for management of their diabetes?  No  Would the patient like to be referred to a Nutritionist or for Diabetic Management?  No   Diabetic Exams:  Diabetic Eye Exam: Completed 10/29/2021 Diabetic Foot Exam: Completed 10/24/2021   Interpreter Needed?: No  Information entered by :: NAllen LPN   Activities of Daily Living    04/20/2022    8:48 AM 03/12/2022   10:56 AM  In your present state of health, do you have any difficulty performing the following activities:  Hearing? 0   Vision? 1   Difficulty concentrating or making decisions? 0   Walking or climbing stairs? 0   Dressing or bathing? 0   Doing  errands, shopping? 0 0  Preparing Food and eating ? N   Using the Toilet? N   In the past six  months, have you accidently leaked urine? N   Do you have problems with loss of bowel control? N   Managing your Medications? N   Managing your Finances? N   Housekeeping or managing your Housekeeping? N     Patient Care Team: Charyl Dancer, NP as PCP - General (Internal Medicine) Volanda Napoleon, MD as Medical Oncologist (Oncology)  Indicate any recent Medical Services you may have received from other than [facility] providers in the past year (date may be approximate).     Assessment:   This is a routine wellness examination for Robynne.  Hearing/Vision screen Vision Screening - Comments:: Regular eye exams, [facility]  Dietary issues and exercise activities discussed: Current Exercise Habits: Home exercise routine, Type of exercise: walking, Time (Minutes): 30, Frequency (Times/Week): 4, Weekly Exercise (Minutes/Week): 120   Goals Addressed             This Visit's Progress    Patient Stated       04/20/2022, wants to keep blood sugar down       Depression Screen    04/20/2022    8:48 AM 03/03/2022    8:25 AM 10/24/2021    1:04 PM 11/22/2019    3:00 PM 06/21/2019    9:43 AM 03/02/2019   11:25 AM 02/08/2019    8:45 AM  PHQ 2/9 Scores  PHQ - 2 Score 0 0 2 0 0 1 0  PHQ- 9 Score   2        Fall Risk    04/20/2022    8:47 AM 03/03/2022    8:25 AM 10/24/2021   10:48 AM 02/20/2019    6:50 PM 02/08/2019    8:44 AM  Fall Risk   Falls in the past year? 0 0 0 1 1  Number falls in past yr: 0  0 1 0  Comment    one off the toilet and one in the front yard dog ran into her  Injury with Fall? 0  0 0   Risk for fall due to : Medication side effect No Fall Risks No Fall Risks History of fall(s);Impaired balance/gait   Follow up Falls prevention discussed;Education provided;Falls evaluation completed   Falls evaluation completed;Education provided     FALL RISK PREVENTION PERTAINING TO THE HOME:  Any stairs in or around the home? Yes  If so, are there any without  handrails? No  Home free of loose throw rugs in walkways, pet beds, electrical cords, etc? Yes  Adequate lighting in your home to reduce risk of falls? Yes   ASSISTIVE DEVICES UTILIZED TO PREVENT FALLS:  Life alert? No  Use of a cane, walker or w/c? No  Grab bars in the bathroom? Yes  Shower chair or bench in shower? Yes  Elevated toilet seat or a handicapped toilet? Yes   TIMED UP AND GO:  Was the test performed? No .      Cognitive Function:        04/20/2022    8:49 AM  6CIT Screen  What Year? 0 points  What month? 0 points  What time? 0 points  Count back from 20 0 points  Months in reverse 0 points  Repeat phrase 0 points  Total Score 0 points    Immunizations Immunization History  Administered Date(s) Administered   Fluad Quad(high Dose 65+)  12/03/2021   Influenza,inj,Quad PF,6+ Mos 04/21/2017, 01/20/2018, 02/08/2019   Influenza-Unspecified 12/21/2013, 04/21/2017, 01/20/2018   PFIZER(Purple Top)SARS-COV-2 Vaccination 01/04/2020, 07/23/2020   Pfizer Covid-19 Vaccine Bivalent Booster 53yr & up 01/07/2021   Pneumococcal Conjugate-13 04/21/2017   Pneumococcal Polysaccharide-23 02/08/2019   Tdap 11/13/2020   Zoster Recombinat (Shingrix) 11/13/2020, 01/31/2021    TDAP status: Up to date  Flu Vaccine status: Up to date  Pneumococcal vaccine status: Up to date  Covid-19 vaccine status: Completed vaccines  Qualifies for Shingles Vaccine? Yes   Zostavax completed Yes   Shingrix Completed?: Yes  Screening Tests Health Maintenance  Topic Date Due   HEMOGLOBIN A1C  09/02/2022   FOOT EXAM  10/25/2022   OPHTHALMOLOGY EXAM  10/30/2022   Diabetic kidney evaluation - Urine ACR  01/27/2023   Diabetic kidney evaluation - eGFR measurement  03/13/2023   Medicare Annual Wellness (AWV)  04/21/2023   COLONOSCOPY (Pts 45-448yrInsurance coverage will need to be confirmed)  02/13/2024   DTaP/Tdap/Td (2 - Td or Tdap) 11/14/2030   Pneumonia Vaccine 6565Years old   Completed   INFLUENZA VACCINE  Completed   DEXA SCAN  Completed   Hepatitis C Screening  Completed   Zoster Vaccines- Shingrix  Completed   HPV VACCINES  Aged Out   COVID-19 Vaccine  Discontinued    Health Maintenance  There are no preventive care reminders to display for this patient.  Colorectal cancer screening: Type of screening: Colonoscopy. Completed 02/12/2021. Repeat every 3 years  Mammogram status: Completed 07/16/2021. Repeat every year  Bone Density status: Completed 09/30/2021.  Lung Cancer Screening: (Low Dose CT Chest recommended if Age 76-80ears, 30 pack-year currently smoking OR have quit w/in 15years.) does not qualify.   Lung Cancer Screening Referral: no  Additional Screening:  Hepatitis C Screening: does qualify; Completed 02/08/2019  Vision Screening: Recommended annual ophthalmology exams for early detection of glaucoma and other disorders of the eye. Is the patient up to date with their annual eye exam?  Yes  Who is the provider or what is the name of the office in which the patient attends annual eye exams? [facility] with a provider, would they like to be referred to a provider to establish care? No .   Dental Screening: Recommended annual dental exams for proper oral hygiene  Community Resource Referral / Chronic Care Management: CRR required this visit?  No   CCM required this visit?  No      Plan:     I have personally reviewed and noted the following in the patient's chart:   Medical and social history Use of alcohol, tobacco or illicit drugs  Current medications and supplements including opioid prescriptions. Patient is not currently taking opioid prescriptions. Functional ability and status Nutritional status Physical activity Advanced directives List of other physicians Hospitalizations, surgeries, and ER visits in previous 12 months Vitals Screenings to include cognitive, depression, and  falls Referrals and appointments  In addition, I have reviewed and discussed with patient certain preventive protocols, quality metrics, and best practice recommendations. A written personalized care plan for preventive services as well as general preventive health recommendations were provided to patient.     NiKellie SimmeringLPN   1/0/11/3816 Nurse Notes: needs assistance with heating bill.  Due to this being a virtual visit, the after visit summary with patients personalized plan was offered to patient via mail or my-chart. Patient would like to access on my-chart

## 2022-05-08 ENCOUNTER — Inpatient Hospital Stay: Payer: Medicare HMO | Admitting: Hematology & Oncology

## 2022-05-08 ENCOUNTER — Inpatient Hospital Stay: Payer: Medicare HMO | Attending: Gastroenterology

## 2022-05-08 ENCOUNTER — Encounter: Payer: Self-pay | Admitting: Hematology & Oncology

## 2022-05-08 ENCOUNTER — Inpatient Hospital Stay: Payer: Medicare HMO

## 2022-05-08 ENCOUNTER — Other Ambulatory Visit: Payer: Self-pay

## 2022-05-08 VITALS — BP 127/60 | HR 87 | Temp 98.0°F | Resp 18 | Ht 61.0 in | Wt 162.0 lb

## 2022-05-08 DIAGNOSIS — Z170 Estrogen receptor positive status [ER+]: Secondary | ICD-10-CM | POA: Insufficient documentation

## 2022-05-08 DIAGNOSIS — C50912 Malignant neoplasm of unspecified site of left female breast: Secondary | ICD-10-CM | POA: Diagnosis not present

## 2022-05-08 DIAGNOSIS — Z79899 Other long term (current) drug therapy: Secondary | ICD-10-CM | POA: Diagnosis not present

## 2022-05-08 DIAGNOSIS — D563 Thalassemia minor: Secondary | ICD-10-CM | POA: Diagnosis not present

## 2022-05-08 DIAGNOSIS — Z7952 Long term (current) use of systemic steroids: Secondary | ICD-10-CM | POA: Diagnosis not present

## 2022-05-08 DIAGNOSIS — M8580 Other specified disorders of bone density and structure, unspecified site: Secondary | ICD-10-CM

## 2022-05-08 DIAGNOSIS — C50312 Malignant neoplasm of lower-inner quadrant of left female breast: Secondary | ICD-10-CM

## 2022-05-08 DIAGNOSIS — Z79811 Long term (current) use of aromatase inhibitors: Secondary | ICD-10-CM | POA: Insufficient documentation

## 2022-05-08 LAB — CBC WITH DIFFERENTIAL (CANCER CENTER ONLY)
Abs Immature Granulocytes: 0.02 10*3/uL (ref 0.00–0.07)
Basophils Absolute: 0.1 10*3/uL (ref 0.0–0.1)
Basophils Relative: 1 %
Eosinophils Absolute: 0.6 10*3/uL — ABNORMAL HIGH (ref 0.0–0.5)
Eosinophils Relative: 6 %
HCT: 40.1 % (ref 36.0–46.0)
Hemoglobin: 12.1 g/dL (ref 12.0–15.0)
Immature Granulocytes: 0 %
Lymphocytes Relative: 30 %
Lymphs Abs: 2.9 10*3/uL (ref 0.7–4.0)
MCH: 21.8 pg — ABNORMAL LOW (ref 26.0–34.0)
MCHC: 30.2 g/dL (ref 30.0–36.0)
MCV: 72.4 fL — ABNORMAL LOW (ref 80.0–100.0)
Monocytes Absolute: 0.9 10*3/uL (ref 0.1–1.0)
Monocytes Relative: 10 %
Neutro Abs: 5.2 10*3/uL (ref 1.7–7.7)
Neutrophils Relative %: 53 %
Platelet Count: 302 10*3/uL (ref 150–400)
RBC: 5.54 MIL/uL — ABNORMAL HIGH (ref 3.87–5.11)
RDW: 16 % — ABNORMAL HIGH (ref 11.5–15.5)
WBC Count: 9.7 10*3/uL (ref 4.0–10.5)
nRBC: 0 % (ref 0.0–0.2)

## 2022-05-08 LAB — CMP (CANCER CENTER ONLY)
ALT: 10 U/L (ref 0–44)
AST: 15 U/L (ref 15–41)
Albumin: 4.2 g/dL (ref 3.5–5.0)
Alkaline Phosphatase: 125 U/L (ref 38–126)
Anion gap: 12 (ref 5–15)
BUN: 16 mg/dL (ref 8–23)
CO2: 24 mmol/L (ref 22–32)
Calcium: 10 mg/dL (ref 8.9–10.3)
Chloride: 102 mmol/L (ref 98–111)
Creatinine: 0.9 mg/dL (ref 0.44–1.00)
GFR, Estimated: >60 mL/min (ref >60)
Glucose, Bld: 123 mg/dL — ABNORMAL HIGH (ref 70–99)
Potassium: 3.6 mmol/L (ref 3.5–5.1)
Sodium: 138 mmol/L (ref 135–145)
Total Bilirubin: 0.4 mg/dL (ref 0.3–1.2)
Total Protein: 7.7 g/dL (ref 6.5–8.1)

## 2022-05-08 LAB — LACTATE DEHYDROGENASE: LDH: 132 U/L (ref 98–192)

## 2022-05-08 MED ORDER — SODIUM CHLORIDE 0.9 % IV SOLN: Freq: Once | INTRAVENOUS | Status: DC

## 2022-05-08 MED ORDER — DENOSUMAB 60 MG/ML ~~LOC~~ SOSY
60.0000 mg | PREFILLED_SYRINGE | Freq: Once | SUBCUTANEOUS | Status: DC
  Filled 2022-05-08: qty 1

## 2022-05-08 NOTE — Progress Notes (Signed)
Injection not needed per Dr. Marin Olp.

## 2022-05-08 NOTE — Progress Notes (Signed)
Hematology and Oncology Follow Up Visit  Bonnie Reed [id number] [birth year] 77 y.o. 05/08/2022   Principle Diagnosis:  Invasive ductal carcinoma of the LEFT breast -- Stage 1 (T1cNxM0) --ER+/HER2- -- Oncotype = 3 Alpha thalassemia minor trait  Current Therapy:  S/P LEFT MRM on 0000000  Folic acid 1 mg PO daily Femara 2.5 mg p.o. daily -start on 10/03/2021 Prolia 60 mg IM q. 12 months --next dose on 11/10/2022   Interim History:  Bonnie Reed is here today for follow-up.  She is doing quite well.  She is all excited because in March, she was going up to [place] to see the [employer] musical.  I had no idea that there was a [employer] musical.    She is having some problems with the left mastectomy site.  She is retaining fluid.  She is going to go back to see the surgeon to see how this can help this.  She is doing well with the Femara.  She only wants the Prolia once a year.  I think this will be okay.  She has had no problems with hot flashes.  There is been no issues with cough or shortness of breath.  She has avoided COVID.  She did enjoy the [place] season.  She has had no rashes.  There is been no bleeding.  Overall, I would say that her performance status is probably ECOG 1.      Medications:  Allergies as of 05/08/2022       Reactions   Phenylpropanol Other (See Comments)   Unknown/cannot remember        Medication List        Accurate as of May 08, 2022 11:20 AM. If you have any questions, ask your nurse or doctor.          Accu-Chek Softclix Lancets lancets Use to check blood sugar three times daily or as directed. What changed:  how much to take how to take this when to take this   aspirin EC 81 MG tablet Take 81 mg by mouth daily.   atorvastatin 40 MG tablet Commonly known as: LIPITOR Take 1 tablet (40 mg total) by mouth daily.   BD Pen Needle Nano 2nd Gen 32G X 4 MM Misc Generic drug: Insulin Pen Needle USE AS  DIRECTED THREE TIMES DAILY   cetirizine 10 MG tablet Commonly known as: ZYRTEC Take 10 mg by mouth daily as needed for allergies.   cholecalciferol 25 MCG (1000 UNIT) tablet Commonly known as: VITAMIN D3 Take 1,000 Units by mouth daily.   empagliflozin 10 MG Tabs tablet Commonly known as: Jardiance Take 1 tablet (10 mg total) by mouth daily.   fluticasone 50 MCG/ACT nasal spray Commonly known as: FLONASE Place 2 sprays into both nostrils daily as needed for allergies.   folic acid 1 MG tablet Commonly known as: FOLVITE Take 1 tablet (1 mg total) by mouth daily.   gabapentin 100 MG capsule Commonly known as: NEURONTIN Take 100 mg by mouth 2 (two) times daily as needed (pain).   GENTEAL OP Place 1 drop into both eyes daily as needed (dry eyes).   GlucoCom Blood Glucose Monitor Devi 1 each by Other route daily.   ibuprofen 200 MG tablet Commonly known as: ADVIL Take 200 mg by mouth every 6 (six) hours as needed for moderate pain.   Lantus SoloStar 100 UNIT/ML Solostar Pen Generic drug: insulin glargine Inject 20 Units into the skin 2 (two) times daily.  letrozole 2.5 MG tablet Commonly known as: FEMARA Take 1 tablet (2.5 mg total) by mouth daily.   Linzess 145 MCG Caps capsule Generic drug: linaclotide TAKE 1 CAPSULE BY MOUTH ONCE DAILY AS NEEDED FOR CONSTIPATION   NovoLOG FlexPen 100 UNIT/ML FlexPen Generic drug: insulin aspart INJECT 15 UNITS SUBCUTANEOUSLY THREE TIMES DAILY WITH MEALS   ONE TOUCH DELICA LANCING DEV Misc Use to test 3 times a day Dx:E11.9 What changed:  how much to take how to take this when to take this   oxyCODONE 5 MG immediate release tablet Commonly known as: Roxicodone Take 1 tablet (5 mg total) by mouth every 6 (six) hours as needed for severe pain.   Ozempic (2 MG/DOSE) 8 MG/3ML Sopn Generic drug: Semaglutide (2 MG/DOSE) Inject 2 mg into the skin once a week.   pantoprazole 40 MG tablet Commonly known as: PROTONIX Take 1  tablet (40 mg total) by mouth daily.   Prolia 60 MG/ML Sosy injection Generic drug: denosumab Inject into the skin.   traZODone 50 MG tablet Commonly known as: DESYREL Take 0.5-1 tablets (25-50 mg total) by mouth at bedtime as needed for sleep.   valsartan 80 MG tablet Commonly known as: DIOVAN Take 1 tablet (80 mg total) by mouth daily.        Allergies:  Allergies  Allergen Reactions   Phenylpropanol Other (See Comments)    Unknown/cannot remember    Past Medical History, Surgical history, Social history, and Family History were reviewed and updated.  Review of Systems: Review of Systems  Constitutional: Negative.   HENT: Negative.  Negative for hearing loss.   Eyes: Negative.   Respiratory: Negative.    Cardiovascular: Negative.   Gastrointestinal: Negative.   Genitourinary: Negative.   Musculoskeletal: Negative.   Skin: Negative.   Neurological: Negative.   Endo/Heme/Allergies: Negative.   Psychiatric/Behavioral: Negative.       Physical Exam:  height is 5' 1"$  (1.549 m) and weight is 162 lb (73.5 kg). Her oral temperature is 98 F (36.7 C). Her blood pressure is 127/60 and her pulse is 18 (abnormal). Her respiration is 18 and oxygen saturation is 99%.   Wt Readings from Last 3 Encounters:  05/08/22 162 lb (73.5 kg)  04/20/22 157 lb (71.2 kg)  03/12/22 163 lb 2.3 oz (74 kg)   Physical Exam Vitals reviewed.  Constitutional:      Comments: Breast exam shows right breast with no masses, edema or erythema.  There is no right axillary adenopathy.  Left chest wall does show the left mastectomy.  She has little bit of swelling about the mastectomy site.  There is no erythema or warmth.    There may be little bit of tenderness at the lateral aspect of the mastectomy.  There is no left axillary lymph nodes.    HENT:     Head: Normocephalic and atraumatic.  Eyes:     Pupils: Pupils are equal, round, and reactive to light.  Cardiovascular:     Rate and Rhythm:  Normal rate and regular rhythm.     Heart sounds: Normal heart sounds.     Comments: Cardiac exam shows regular rate and rhythm with normal S1 and S2.  I do not hear any murmurs, rubs or bruits. Pulmonary:     Effort: Pulmonary effort is normal.     Breath sounds: Normal breath sounds.  Abdominal:     General: Bowel sounds are normal.     Palpations: Abdomen is soft.  Musculoskeletal:  General: No tenderness or deformity. Normal range of motion.     Cervical back: Normal range of motion.     Comments: Extremity shows some mild chronic swelling in the left arm.  There is no swelling in the legs.  Lymphadenopathy:     Cervical: No cervical adenopathy.  Skin:    General: Skin is warm and dry.     Findings: No erythema or rash.  Neurological:     Mental Status: She is alert and oriented to person, place, and time.  Psychiatric:        Behavior: Behavior normal.        Thought Content: Thought content normal.        Judgment: Judgment normal.      Lab Results  Component Value Date   WBC 9.7 05/08/2022   HGB 12.1 05/08/2022   HCT 40.1 05/08/2022   MCV 72.4 (L) 05/08/2022   PLT 302 05/08/2022   Lab Results  Component Value Date   FERRITIN 118 09/24/2021   IRON 85 09/24/2021   TIBC 417 09/24/2021   UIBC 332 09/24/2021   IRONPCTSAT 20 09/24/2021   Lab Results  Component Value Date   RETICCTPCT 1.9 09/24/2021   RBC 5.54 (H) 05/08/2022   No results found for: "KPAFRELGTCHN", "LAMBDASER", "KAPLAMBRATIO" No results found for: "IGGSERUM", "IGA", "IGMSERUM" No results found for: "TOTALPROTELP", "ALBUMINELP", "A1GS", "A2GS", "BETS", "BETA2SER", "GAMS", "MSPIKE", "SPEI"   Chemistry      Component Value Date/Time   NA 138 05/08/2022 1026   NA 141 02/08/2019 1049   K 3.6 05/08/2022 1026   CL 102 05/08/2022 1026   CO2 24 05/08/2022 1026   BUN 16 05/08/2022 1026   BUN 15 02/08/2019 1049   CREATININE 0.90 05/08/2022 1026      Component Value Date/Time   CALCIUM 10.0  05/08/2022 1026   ALKPHOS 125 05/08/2022 1026   AST 15 05/08/2022 1026   ALT 10 05/08/2022 1026   BILITOT 0.4 05/08/2022 1026       Impression and Plan: Ms. Reffitt is a very pleasant 77 yo African American female with alpha thalassemia minor trait.  She has remote history of breast cancer of the left breast.  She now has another breast cancer.  Thankfully, this is early stage.  Even more important is the fact that it is estrogen positive and HER2 negative.  There is a very low Oncotype score.  She will continue on the Femara.  I think this is very reasonable for her.  I have no problems with doing the Prolia yearly.  Of note, she does have the alpha thalassemia.  She is on folic acid for this.  I know that she will have a wonderful time open [place].  I look forward to hearing about the musical.  We will plan to get her back to see [place] now in another 3-4 months.  Volanda Napoleon, MD 2/16/202411:20 AM

## 2022-05-14 ENCOUNTER — Other Ambulatory Visit: Payer: Self-pay | Admitting: Nurse Practitioner

## 2022-05-14 DIAGNOSIS — C50312 Malignant neoplasm of lower-inner quadrant of left female breast: Secondary | ICD-10-CM | POA: Diagnosis not present

## 2022-05-14 DIAGNOSIS — Z170 Estrogen receptor positive status [ER+]: Secondary | ICD-10-CM | POA: Diagnosis not present

## 2022-05-15 ENCOUNTER — Other Ambulatory Visit: Payer: Self-pay | Admitting: Nurse Practitioner

## 2022-06-02 DIAGNOSIS — R32 Unspecified urinary incontinence: Secondary | ICD-10-CM | POA: Diagnosis not present

## 2022-06-02 DIAGNOSIS — E669 Obesity, unspecified: Secondary | ICD-10-CM | POA: Diagnosis not present

## 2022-06-02 DIAGNOSIS — K219 Gastro-esophageal reflux disease without esophagitis: Secondary | ICD-10-CM | POA: Diagnosis not present

## 2022-06-02 DIAGNOSIS — M1990 Unspecified osteoarthritis, unspecified site: Secondary | ICD-10-CM | POA: Diagnosis not present

## 2022-06-02 DIAGNOSIS — E538 Deficiency of other specified B group vitamins: Secondary | ICD-10-CM | POA: Diagnosis not present

## 2022-06-02 DIAGNOSIS — I509 Heart failure, unspecified: Secondary | ICD-10-CM | POA: Diagnosis not present

## 2022-06-02 DIAGNOSIS — I2510 Atherosclerotic heart disease of native coronary artery without angina pectoris: Secondary | ICD-10-CM | POA: Diagnosis not present

## 2022-06-02 DIAGNOSIS — M8580 Other specified disorders of bone density and structure, unspecified site: Secondary | ICD-10-CM | POA: Diagnosis not present

## 2022-06-02 DIAGNOSIS — G4700 Insomnia, unspecified: Secondary | ICD-10-CM | POA: Diagnosis not present

## 2022-06-02 DIAGNOSIS — Z008 Encounter for other general examination: Secondary | ICD-10-CM | POA: Diagnosis not present

## 2022-06-02 DIAGNOSIS — E785 Hyperlipidemia, unspecified: Secondary | ICD-10-CM | POA: Diagnosis not present

## 2022-06-02 DIAGNOSIS — E1142 Type 2 diabetes mellitus with diabetic polyneuropathy: Secondary | ICD-10-CM | POA: Diagnosis not present

## 2022-06-02 DIAGNOSIS — J309 Allergic rhinitis, unspecified: Secondary | ICD-10-CM | POA: Diagnosis not present

## 2022-06-09 ENCOUNTER — Ambulatory Visit (INDEPENDENT_AMBULATORY_CARE_PROVIDER_SITE_OTHER): Payer: Medicare HMO | Admitting: Nurse Practitioner

## 2022-06-09 ENCOUNTER — Encounter: Payer: Self-pay | Admitting: Nurse Practitioner

## 2022-06-09 VITALS — BP 122/62 | HR 96 | Temp 98.4°F | Ht 61.0 in | Wt 160.0 lb

## 2022-06-09 DIAGNOSIS — J0110 Acute frontal sinusitis, unspecified: Secondary | ICD-10-CM | POA: Diagnosis not present

## 2022-06-09 DIAGNOSIS — E118 Type 2 diabetes mellitus with unspecified complications: Secondary | ICD-10-CM | POA: Diagnosis not present

## 2022-06-09 DIAGNOSIS — Z794 Long term (current) use of insulin: Secondary | ICD-10-CM | POA: Diagnosis not present

## 2022-06-09 DIAGNOSIS — K219 Gastro-esophageal reflux disease without esophagitis: Secondary | ICD-10-CM | POA: Diagnosis not present

## 2022-06-09 DIAGNOSIS — I509 Heart failure, unspecified: Secondary | ICD-10-CM | POA: Diagnosis not present

## 2022-06-09 LAB — POCT GLYCOSYLATED HEMOGLOBIN (HGB A1C)
HbA1c POC (<> result, manual entry): 5.6 % (ref 4.0–5.6)
HbA1c, POC (controlled diabetic range): 5.6 % (ref 0.0–7.0)
HbA1c, POC (prediabetic range): 5.6 % — AB (ref 5.7–6.4)
Hemoglobin A1C: 5.6 % (ref 4.0–5.6)

## 2022-06-09 MED ORDER — ESOMEPRAZOLE MAGNESIUM 40 MG PO CPDR: 40.0000 mg | DELAYED_RELEASE_CAPSULE | Freq: Every day | ORAL | 3 refills | Status: DC

## 2022-06-09 MED ORDER — FLUCONAZOLE 150 MG PO TABS: ORAL_TABLET | ORAL | 0 refills | Status: DC

## 2022-06-09 MED ORDER — AMOXICILLIN-POT CLAVULANATE 875-125 MG PO TABS: 1.0000 | ORAL_TABLET | Freq: Two times a day (BID) | ORAL | 0 refills | Status: DC

## 2022-06-09 MED ORDER — FREESTYLE LIBRE 3 READER DEVI: 1.0000 | 3 refills | Status: DC

## 2022-06-09 MED ORDER — FLUTICASONE PROPIONATE 50 MCG/ACT NA SUSP: 2.0000 | Freq: Every day | NASAL | 3 refills | Status: DC | PRN

## 2022-06-09 MED ORDER — TRAZODONE HCL 50 MG PO TABS: 25.0000 mg | ORAL_TABLET | Freq: Every evening | ORAL | 0 refills | Status: DC | PRN

## 2022-06-09 NOTE — Patient Instructions (Signed)
It was great to see you!  Stop the humalog. Decrease your lantus to 18 units once a day. Keep checking your sugars.   Goal is for your sugars in the morning before you eat to be less than 130 Gaol for your sugars 2 hours after eating to be less than 200.   Keep taking the zyrtec and fluticasone nasal spray.  Stop the afrin (you can use this for 3 days)  Start augmentin twice a day for 10 days with food.   I have sent diflucan 1 tablet to take once finishing the antibiotic to prevent yeast.   I have refilled your trazodone, flonase.  Let's follow-up in 3 months, sooner if you have concerns.  If a referral was placed today, you will be contacted for an appointment. Please note that routine referrals can sometimes take up to 3-4 weeks to process. Please call our office if you haven't heard anything after this time frame.  Take care,  Vance Peper, NP

## 2022-06-09 NOTE — Progress Notes (Unsigned)
Acute Office Visit  Subjective:     Patient ID: Bonnie Reed, female    DOB: 1945/12/26, 77 y.o.   MRN: [id number]  Chief Complaint  Patient presents with   Sinusitis    Cough, Stuffy nose, head congestion, soreness under eyes for 2 weeks    HPI Patient is in today for congestion and sinus pain  for about 2 weeks. She is also wanting her A1c checked.   She has decreased her humalog insulin to once a day and is taking lantus 18 units BID. She states her blood sugars are in the 80s in the morning and will go up to 140s during the day. She denies low blood sugars.   UPPER RESPIRATORY TRACT INFECTION  Fever: no Cough: no Shortness of breath: no Wheezing: no Chest pain: no Chest tightness: no Chest congestion: no Nasal congestion: yes Runny nose: yes Post nasal drip: yes Sneezing: yes Sore throat: no Swollen glands: no Sinus pressure: yes Headache: yes Face pain: no Toothache: no Ear pain: no bilateral Ear pressure: yes bilateral Eyes red/itching:yes Eye drainage/crusting: no  Vomiting: no Rash: no Fatigue: yes Sick contacts: no Strep contacts: no  Context: worse Recurrent sinusitis: no Relief with OTC cold/cough medications:  for a short time   Treatments attempted: flonase, afrin, zyrtec  ROS See pertinent positives and negatives per HPI.     Objective:    BP 122/62 (BP Location: Right Arm)   Pulse 96   Temp 98.4 F (36.9 C)   Ht 5\' 1"  (1.549 m)   Wt 160 lb (72.6 kg)   LMP 10/17/1991 (Exact Date)   SpO2 96%   BMI 30.23 kg/m  BP Readings from Last 3 Encounters:  06/09/22 122/62  05/08/22 127/60  03/12/22 (!) 163/69   Wt Readings from Last 3 Encounters:  06/09/22 160 lb (72.6 kg)  05/08/22 162 lb (73.5 kg)  04/20/22 157 lb (71.2 kg)      Physical Exam Vitals and nursing note reviewed.  Constitutional:      General: She is not in acute distress.    Appearance: Normal appearance.  HENT:     Head: Normocephalic.     Right Ear: Tympanic  membrane, ear canal and external ear normal.     Left Ear: Tympanic membrane, ear canal and external ear normal.     Nose:     Right Sinus: Frontal sinus tenderness present.     Left Sinus: Frontal sinus tenderness present.     Mouth/Throat:     Pharynx: Posterior oropharyngeal erythema present. No oropharyngeal exudate.  Eyes:     Conjunctiva/sclera: Conjunctivae normal.  Cardiovascular:     Rate and Rhythm: Normal rate and regular rhythm.     Pulses: Normal pulses.     Heart sounds: Normal heart sounds.  Pulmonary:     Effort: Pulmonary effort is normal.     Breath sounds: Normal breath sounds.  Musculoskeletal:     Cervical back: Normal range of motion and neck supple. No tenderness.  Lymphadenopathy:     Cervical: No cervical adenopathy.  Skin:    General: Skin is warm.  Neurological:     General: No focal deficit present.     Mental Status: She is alert and oriented to person, place, and time.  Psychiatric:        Mood and Affect: Mood normal.        Behavior: Behavior normal.        Thought Content: Thought content normal.  Judgment: Judgment normal.     Results for orders placed or performed in visit on 06/09/22  POCT glycosylated hemoglobin (Hb A1C)  Result Value Ref Range   Hemoglobin A1C 5.6 4.0 - 5.6 %   HbA1c POC (<> result, manual entry) 5.6 4.0 - 5.6 %   HbA1c, POC (prediabetic range) 5.6 (A) 5.7 - 6.4 %   HbA1c, POC (controlled diabetic range) 5.6 0.0 - 7.0 %        Assessment & Plan:   Problem List Items Addressed This Visit       Cardiovascular and Mediastinum   CHF (congestive heart failure) (HCC)    Chronic, stable.  She is euvolemic on exam today.  She is currently following with cardiology.  Continue collaboration recommendations from cardiology.        Digestive   Gastroesophageal reflux disease without esophagitis    Nexium seems to be helping better than Protonix, will switch to Nexium.      Relevant Medications   esomeprazole  (NEXIUM) 40 MG capsule     Endocrine   Type 2 diabetes mellitus with complication, with long-term current use of insulin (HCC) - Primary    A1c was 5.6% today.  She is doing very well with starting the Ozempic.  She is having some bloating, otherwise she is doing well.  Will have her continue Ozempic 2mg  injection weekly.  Will have her stop her Humalog and decrease the Lantus to 18 units once a day.  With her A1c being 5.6, concern for low blood sugars that she is not catching.  I would like her to check her blood sugars at least 4 times a day, will order a freestyle libre to help make sure she is not having any low blood sugars.  She can continue to decrease her Lantus by 5 units every few days as long as her blood sugars are staying less than 130 in the morning and 200 after eating. Follow- up in 3 months.       Relevant Orders   POCT glycosylated hemoglobin (Hb A1C) (Completed)   Other Visit Diagnoses     Acute non-recurrent frontal sinusitis       Treat with augmentin BID x10 days. Start flonase nasal spray daily. Diflucan after finishing abx to prevent yeast. Encourage fluids, rest   Relevant Medications   amoxicillin-clavulanate (AUGMENTIN) 875-125 MG tablet   fluconazole (DIFLUCAN) 150 MG tablet   fluticasone (FLONASE) 50 MCG/ACT nasal spray       Meds ordered this encounter  Medications   esomeprazole (NEXIUM) 40 MG capsule    Sig: Take 1 capsule (40 mg total) by mouth daily.    Dispense:  90 capsule    Refill:  3   traZODone (DESYREL) 50 MG tablet    Sig: Take 0.5-1 tablets (25-50 mg total) by mouth at bedtime as needed for sleep.    Dispense:  90 tablet    Refill:  0   amoxicillin-clavulanate (AUGMENTIN) 875-125 MG tablet    Sig: Take 1 tablet by mouth 2 (two) times daily.    Dispense:  20 tablet    Refill:  0   fluconazole (DIFLUCAN) 150 MG tablet    Sig: Take 1 tablet after finishing the antibiotic and 1 tablet 3 days later if needed    Dispense:  2 tablet     Refill:  0   fluticasone (FLONASE) 50 MCG/ACT nasal spray    Sig: Place 2 sprays into both nostrils daily as needed for  allergies.    Dispense:  15.8 mL    Refill:  3   Continuous Blood Gluc Receiver (FREESTYLE LIBRE 3 READER) DEVI    Sig: 1 each by Does not apply route every 14 (fourteen) days.    Dispense:  2 each    Refill:  3    Return in about 3 months (around 09/09/2022) for Diabetes.  Charyl Dancer, NP

## 2022-06-10 NOTE — Assessment & Plan Note (Signed)
Chronic, stable.  She is euvolemic on exam today.  She is currently following with cardiology.  Continue collaboration recommendations from cardiology. 

## 2022-06-10 NOTE — Assessment & Plan Note (Signed)
A1c was 5.6% today.  She is doing very well with starting the Ozempic.  She is having some bloating, otherwise she is doing well.  Will have her continue Ozempic 2mg  injection weekly.  Will have her stop her Humalog and decrease the Lantus to 18 units once a day.  With her A1c being 5.6, concern for low blood sugars that she is not catching.  I would like her to check her blood sugars at least 4 times a day, will order a freestyle libre to help make sure she is not having any low blood sugars.  She can continue to decrease her Lantus by 5 units every few days as long as her blood sugars are staying less than 130 in the morning and 200 after eating. Follow- up in 3 months.

## 2022-06-10 NOTE — Assessment & Plan Note (Signed)
Nexium seems to be helping better than Protonix, will switch to Nexium.

## 2022-06-13 ENCOUNTER — Other Ambulatory Visit: Payer: Self-pay | Admitting: Nurse Practitioner

## 2022-06-14 ENCOUNTER — Other Ambulatory Visit: Payer: Self-pay | Admitting: Nurse Practitioner

## 2022-06-15 ENCOUNTER — Other Ambulatory Visit: Payer: Self-pay | Admitting: Nurse Practitioner

## 2022-06-17 DIAGNOSIS — C50312 Malignant neoplasm of lower-inner quadrant of left female breast: Secondary | ICD-10-CM | POA: Diagnosis not present

## 2022-06-17 DIAGNOSIS — Z170 Estrogen receptor positive status [ER+]: Secondary | ICD-10-CM | POA: Diagnosis not present

## 2022-07-02 ENCOUNTER — Telehealth: Payer: Self-pay

## 2022-07-02 ENCOUNTER — Ambulatory Visit: Payer: Self-pay | Admitting: General Surgery

## 2022-07-02 ENCOUNTER — Telehealth: Payer: Self-pay | Admitting: Cardiology

## 2022-07-02 DIAGNOSIS — C50312 Malignant neoplasm of lower-inner quadrant of left female breast: Secondary | ICD-10-CM | POA: Diagnosis not present

## 2022-07-02 DIAGNOSIS — Z170 Estrogen receptor positive status [ER+]: Secondary | ICD-10-CM | POA: Diagnosis not present

## 2022-07-02 NOTE — Telephone Encounter (Signed)
Pt called complaining of chest pressure with some shortness of breath. She stated that she feels like she pulled a muscle. Advised her to go to the ED if her symptoms worsen and sent message to front desk to make appt for pt to be seen.

## 2022-07-02 NOTE — Telephone Encounter (Signed)
Pt c/o of Chest Pain: STAT if CP now or developed within 24 hours  1. Are you having CP right now? Yes, Feels like she pulled a muscle in her chest  2. Are you experiencing any other symptoms (ex. SOB, nausea, vomiting, sweating)? A little SOB  3. How long have you been experiencing CP? For about a week now  4. Is your CP continuous or coming and going? Coming and going  5. Have you taken Nitroglycerin? No, Patient does not have any ?

## 2022-07-05 ENCOUNTER — Emergency Department (HOSPITAL_COMMUNITY): Payer: Medicare HMO

## 2022-07-05 ENCOUNTER — Other Ambulatory Visit: Payer: Self-pay

## 2022-07-05 ENCOUNTER — Encounter (HOSPITAL_COMMUNITY): Payer: Self-pay

## 2022-07-05 ENCOUNTER — Emergency Department (HOSPITAL_COMMUNITY)
Admission: EM | Admit: 2022-07-05 | Discharge: 2022-07-05 | Disposition: A | Discharge status: Home / Self Care | Payer: Medicare HMO | Attending: Emergency Medicine | Admitting: Emergency Medicine

## 2022-07-05 DIAGNOSIS — R079 Chest pain, unspecified: Secondary | ICD-10-CM

## 2022-07-05 DIAGNOSIS — Z794 Long term (current) use of insulin: Secondary | ICD-10-CM | POA: Diagnosis not present

## 2022-07-05 DIAGNOSIS — I509 Heart failure, unspecified: Secondary | ICD-10-CM | POA: Diagnosis not present

## 2022-07-05 DIAGNOSIS — Z7984 Long term (current) use of oral hypoglycemic drugs: Secondary | ICD-10-CM | POA: Diagnosis not present

## 2022-07-05 DIAGNOSIS — I110 Hypertensive heart disease with heart failure: Secondary | ICD-10-CM | POA: Insufficient documentation

## 2022-07-05 DIAGNOSIS — Z7982 Long term (current) use of aspirin: Secondary | ICD-10-CM | POA: Diagnosis not present

## 2022-07-05 DIAGNOSIS — R0789 Other chest pain: Secondary | ICD-10-CM | POA: Insufficient documentation

## 2022-07-05 DIAGNOSIS — I2510 Atherosclerotic heart disease of native coronary artery without angina pectoris: Secondary | ICD-10-CM | POA: Diagnosis not present

## 2022-07-05 DIAGNOSIS — Z79899 Other long term (current) drug therapy: Secondary | ICD-10-CM | POA: Diagnosis not present

## 2022-07-05 DIAGNOSIS — E119 Type 2 diabetes mellitus without complications: Secondary | ICD-10-CM | POA: Diagnosis not present

## 2022-07-05 DIAGNOSIS — R202 Paresthesia of skin: Secondary | ICD-10-CM | POA: Diagnosis not present

## 2022-07-05 DIAGNOSIS — I700 Atherosclerosis of aorta: Secondary | ICD-10-CM | POA: Diagnosis not present

## 2022-07-05 LAB — BASIC METABOLIC PANEL
Anion gap: 11 (ref 5–15)
BUN: 16 mg/dL (ref 8–23)
CO2: 25 mmol/L (ref 22–32)
Calcium: 9.9 mg/dL (ref 8.9–10.3)
Chloride: 104 mmol/L (ref 98–111)
Creatinine, Ser: 0.92 mg/dL (ref 0.44–1.00)
GFR, Estimated: >60 mL/min (ref >60)
Glucose, Bld: 113 mg/dL — ABNORMAL HIGH (ref 70–99)
Potassium: 4 mmol/L (ref 3.5–5.1)
Sodium: 140 mmol/L (ref 135–145)

## 2022-07-05 LAB — CBC
HCT: 40.6 % (ref 36.0–46.0)
Hemoglobin: 12.4 g/dL (ref 12.0–15.0)
MCH: 22.3 pg — ABNORMAL LOW (ref 26.0–34.0)
MCHC: 30.5 g/dL (ref 30.0–36.0)
MCV: 72.9 fL — ABNORMAL LOW (ref 80.0–100.0)
Platelets: 321 10*3/uL (ref 150–400)
RBC: 5.57 MIL/uL — ABNORMAL HIGH (ref 3.87–5.11)
RDW: 15.9 % — ABNORMAL HIGH (ref 11.5–15.5)
WBC: 8.8 10*3/uL (ref 4.0–10.5)
nRBC: 0 % (ref 0.0–0.2)

## 2022-07-05 LAB — TROPONIN I (HIGH SENSITIVITY)
Troponin I (High Sensitivity): 5 ng/L (ref <18)
Troponin I (High Sensitivity): 5 ng/L (ref <18)

## 2022-07-05 MED ORDER — IOHEXOL 350 MG/ML SOLN
100.0000 mL | Freq: Once | INTRAVENOUS | Status: AC | PRN
  Administered 2022-07-05: 100 mL via INTRAVENOUS

## 2022-07-05 MED ORDER — FENTANYL CITRATE PF 50 MCG/ML IJ SOSY
50.0000 ug | PREFILLED_SYRINGE | Freq: Once | INTRAMUSCULAR | Status: AC
  Administered 2022-07-05: 50 ug via INTRAVENOUS
  Filled 2022-07-05: qty 1

## 2022-07-05 MED ORDER — NITROGLYCERIN 0.4 MG SL SUBL
0.4000 mg | SUBLINGUAL_TABLET | Freq: Once | SUBLINGUAL | Status: AC
  Administered 2022-07-05: 0.4 mg via SUBLINGUAL
  Filled 2022-07-05: qty 1

## 2022-07-05 MED ORDER — SODIUM CHLORIDE 0.9 % IV BOLUS
500.0000 mL | Freq: Once | INTRAVENOUS | Status: AC
  Administered 2022-07-05: 500 mL via INTRAVENOUS

## 2022-07-05 MED ORDER — KETOROLAC TROMETHAMINE 15 MG/ML IJ SOLN: 15.0000 mg | Freq: Once | INTRAMUSCULAR | Status: DC

## 2022-07-05 NOTE — ED Provider Notes (Addendum)
[employer] EMERGENCY DEPARTMENT AT [facility] Provider Note   CSN: [id number] Arrival date & time: 07/05/22  4098     History  No chief complaint on file.   Bonnie Reed is a 77 y.o. female history includes CHF, diabetes, GERD, hypertension, hyperlipidemia, CAD.  Patient presents to the emergency ferment for evaluation of intermittent chest pain for the past 1 week she describes a central chest pain which occasionally radiates to her back sometimes up to her neck and occasionally to her left arm.  Pain is mild-moderate and aching, it is associate with occasional tingling sensation of her left hand.  She denies similar pain in the past.  She took some 2 aspirin today without improvement.  She denies any recent injury, fever, chills, cough/hemoptysis, extremity swelling/color change, abdominal pain, vomiting, diarrhea or any additional concerns.  HPI     Home Medications Prior to Admission medications   Medication Sig Start Date End Date Taking? Authorizing Provider  ACCU-CHEK SOFTCLIX LANCETS lancets Use to check blood sugar three times daily or as directed. Patient taking differently: 1 each by Other route See admin instructions. Use to check blood sugar three times daily or as directed. 05/26/18   Westley Chandler, MD  amoxicillin-clavulanate (AUGMENTIN) 875-125 MG tablet Take 1 tablet by mouth 2 (two) times daily. Patient not taking: Reported on 07/03/2022 06/09/22   Gerre Scull, NP  aspirin EC 81 MG tablet Take 81 mg by mouth in the morning.    [provider]  atorvastatin (LIPITOR) 40 MG tablet Take 1 tablet (40 mg total) by mouth daily. 12/03/21   McElwee, Lauren A, NP  BD PEN NEEDLE NANO 2ND GEN 32G X 4 MM MISC USE AS DIRECTED THREE TIMES DAILY 04/03/22   McElwee, Lauren A, NP  Blood Glucose Monitoring Suppl (GLUCOCOM BLOOD GLUCOSE MONITOR) DEVI 1 each by Other route daily. 03/29/18   [provider]  Carboxymethylcell-Hypromellose (GENTEAL OP) Place  1 drop into both eyes daily as needed (dry eyes).    [provider]  cetirizine (ZYRTEC) 10 MG tablet Take 10 mg by mouth daily as needed for allergies.    [provider]  Continuous Blood Gluc Receiver (FREESTYLE LIBRE 3 READER) DEVI 1 each by Does not apply route every 14 (fourteen) days. 06/09/22   McElwee, Jake Church, NP  denosumab (PROLIA) 60 MG/ML SOSY injection Inject 60 mg into the skin every 6 (six) months.    [provider]  empagliflozin (JARDIANCE) 10 MG TABS tablet Take 1 tablet (10 mg total) by mouth daily. 12/03/21   McElwee, Jake Church, NP  esomeprazole (NEXIUM) 40 MG capsule Take 1 capsule (40 mg total) by mouth daily. 06/09/22   McElwee, Jake Church, NP  fluconazole (DIFLUCAN) 150 MG tablet Take 1 tablet after finishing the antibiotic and 1 tablet 3 days later if needed Patient not taking: Reported on 07/03/2022 06/09/22   McElwee, Lauren A, NP  fluticasone (FLONASE) 50 MCG/ACT nasal spray Place 2 sprays into both nostrils daily as needed for allergies. Patient taking differently: Place 2 sprays into both nostrils in the morning and at bedtime. 06/09/22   McElwee, Jake Church, NP  folic acid (FOLVITE) 1 MG tablet Take 1 tablet (1 mg total) by mouth daily. 03/10/21   Erenest Blank, NP  ibuprofen (ADVIL) 200 MG tablet Take 400 mg by mouth every 8 (eight) hours as needed (pain.).    [provider]  insulin glargine (LANTUS SOLOSTAR) 100 UNIT/ML Solostar Pen  Inject 20 Units into the skin 2 (two) times daily. 03/03/22   McElwee, Jake Church, NP  Lancet Devices (ONE TOUCH DELICA LANCING DEV) MISC Use to test 3 times a day Dx:E11.9 Patient taking differently: 1 each by Other route See admin instructions. Use to test 3 times a day Dx:E11.9 03/29/18   Nestor Ramp, MD  letrozole [facility]) 2.5 MG tablet Take 1 tablet (2.5 mg total) by mouth daily. 09/24/21   Josph Macho, MD  LINZESS 145 MCG CAPS capsule TAKE 1 CAPSULE BY MOUTH ONCE DAILY AS NEEDED FOR CONSTIPATION  06/17/22   McElwee, Lauren A, NP  OZEMPIC, 2 MG/DOSE, 8 MG/3ML SOPN INJECT 2MG  INTO THE SKIN ONCE WEEKLY Patient taking differently: Inject 2 mg into the skin every Saturday. 06/15/22   McElwee, Jake Church, NP  traZODone (DESYREL) 50 MG tablet Take 0.5-1 tablets (25-50 mg total) by mouth at bedtime as needed for sleep. Patient taking differently: Take 50 mg by mouth at bedtime as needed for sleep. 06/09/22   McElwee, Lauren A, NP  valsartan (DIOVAN) 80 MG tablet Take 1 tablet (80 mg total) by mouth daily. 12/03/21   McElwee, Jake Church, NP  Vitamin D, Ergocalciferol, (DRISDOL) 1.25 MG (50000 UNIT) CAPS capsule Take 50,000 Units by mouth every Monday.    [provider]      Allergies    Phenylpropanol    Review of Systems   Review of Systems Ten systems are reviewed and are negative for acute change except as noted in the HPI  Physical Exam Updated Vital Signs BP (!) 150/75   Pulse 63   Temp 98.1 F (36.7 C)   Resp 19   Ht 5\' 1"  (1.549 m)   Wt 71.2 kg   LMP 10/17/1991 (Exact Date)   SpO2 97%   BMI 29.66 kg/m  Physical Exam Constitutional:      General: She is not in acute distress.    Appearance: Normal appearance. She is well-developed. She is not ill-appearing or diaphoretic.  HENT:     Head: Normocephalic and atraumatic.  Eyes:     General: Vision grossly intact. Gaze aligned appropriately.     Pupils: Pupils are equal, round, and reactive to light.  Neck:     Trachea: Trachea and phonation normal.  Cardiovascular:     Rate and Rhythm: Normal rate and regular rhythm.     Pulses: Normal pulses.          Radial pulses are 2+ on the right side and 2+ on the left side.       Dorsalis pedis pulses are 2+ on the right side and 2+ on the left side.     Heart sounds: Normal heart sounds.  Pulmonary:     Effort: Pulmonary effort is normal. No respiratory distress.     Breath sounds: Normal breath sounds.  Abdominal:     General: There is no distension.     Palpations:  Abdomen is soft.     Tenderness: There is no abdominal tenderness. There is no guarding or rebound.  Musculoskeletal:        General: Normal range of motion.     Cervical back: Normal range of motion.     Right lower leg: No edema.     Left lower leg: No edema.     Comments: Mild increased pain with movement of the torso.  No midline spinal tenderness palpation.  No crepitus step-off deformity spine.  Skin:    General: Skin is  warm and dry.  Neurological:     Mental Status: She is alert.     GCS: GCS eye subscore is 4. GCS verbal subscore is 5. GCS motor subscore is 6.     Comments: Speech is clear and goal oriented, follows commands Major Cranial nerves without deficit, no facial droop Moves extremities without ataxia, coordination intact  Psychiatric:        Behavior: Behavior normal.     ED Results / Procedures / Treatments   Labs (all labs ordered are listed, but only abnormal results are displayed) Labs Reviewed  BASIC METABOLIC PANEL - Abnormal; Notable for the following components:      Result Value   Glucose, Bld 113 (*)    All other components within normal limits  CBC - Abnormal; Notable for the following components:   RBC 5.57 (*)    MCV 72.9 (*)    MCH 22.3 (*)    RDW 15.9 (*)    All other components within normal limits  TROPONIN I (HIGH SENSITIVITY)  TROPONIN I (HIGH SENSITIVITY)    EKG EKG Interpretation  Date/Time:  Sunday July 05 2022 08:53:34 EDT Ventricular Rate:  76 PR Interval:  184 QRS Duration: 100 QT Interval:  394 QTC Calculation: 443 R Axis:   -43 Text Interpretation: Normal sinus rhythm Left axis deviation Incomplete right bundle branch block Abnormal ECG When compared with ECG of 14-Aug-2017 15:11, PREVIOUS ECG IS PRESENT NO STEMI Confirmed by Alvester Chou [phone]) on 07/05/2022 9:53:57 AM  Radiology CT Angio Chest/Abd/Pel for Dissection W and/or Wo Contrast  Result Date: 07/05/2022 CLINICAL DATA:  Intermittent chest pain for the  past week. EXAM: CT ANGIOGRAPHY CHEST, ABDOMEN AND PELVIS TECHNIQUE: Non-contrast CT of the chest was initially obtained. Multidetector CT imaging through the chest, abdomen and pelvis was performed using the standard protocol during bolus administration of intravenous contrast. Multiplanar reconstructed images and MIPs were obtained and reviewed to evaluate the vascular anatomy. RADIATION DOSE REDUCTION: This exam was performed according to the departmental dose-optimization program which includes automated exposure control, adjustment of the mA and/or kV according to patient size and/or use of iterative reconstruction technique. CONTRAST:  OMNIPAQUE IOHEXOL 350 MG/ML SOLN COMPARISON:  CT chest dated June 04, 2021. CT abdomen pelvis dated Aug 14, 2017. FINDINGS: CTA CHEST FINDINGS Cardiovascular: Preferential opacification of the thoracic aorta. No evidence of thoracic aortic aneurysm or dissection. No acute intramural hematoma. Mild atherosclerotic calcification of the thoracic aorta. Normal heart size. No pericardial effusion. No pulmonary embolism. Mediastinum/Nodes: Prominent subcentimeter mediastinal lymph nodes are unchanged. No enlarged axillary or hilar lymph nodes. Thyroid gland, trachea, and esophagus demonstrate no significant findings. Lungs/Pleura: Unchanged left-greater-than-right lower lobe bronchiectasis. Similar post radiation changes in the subpleural anterior left upper lobe. No focal consolidation, pleural effusion, or pneumothorax. No suspicious pulmonary nodule. Musculoskeletal: Prior left mastectomy. Left chest wall fluid collection noted, likely postoperative seroma. No surrounding inflammatory changes. No acute or significant osseous findings. Review of the MIP images confirms the above findings. CTA ABDOMEN AND PELVIS FINDINGS VASCULAR Aorta: Normal caliber aorta without aneurysm, dissection, vasculitis or significant stenosis. Mild atherosclerotic calcification. Celiac: Median  arcuate configuration and narrowing of the celiac origin. Otherwise patent without aneurysm, dissection or vasculitis. SMA: Mild narrowing of the SMA origin. Otherwise patent without evidence of aneurysm, dissection, or vasculitis. Renals: Mild narrowing of both renal artery origins. Otherwise patent without evidence of aneurysm, dissection, vasculitis, or fibromuscular dysplasia. IMA: Patent without evidence of aneurysm, dissection, vasculitis or significant stenosis. Inflow:  Patent without evidence of aneurysm, dissection, vasculitis or significant stenosis. Veins: No obvious venous abnormality within the limitations of this arterial phase study. Review of the MIP images confirms the above findings. NON-VASCULAR Hepatobiliary: No focal liver abnormality is seen. Status post cholecystectomy. No biliary dilatation. Pancreas: Unremarkable. No pancreatic ductal dilatation or surrounding inflammatory changes. Spleen: Normal in size without focal abnormality. Adrenals/Urinary Tract: Adrenal glands are unremarkable. Kidneys are normal, without renal calculi, focal lesion, or hydronephrosis. Bladder is unremarkable. Stomach/Bowel: Stomach is within normal limits. History of prior appendectomy. No evidence of bowel wall thickening, distention, or inflammatory changes. Severe left-sided colonic diverticulosis. Lymphatic: No enlarged abdominal or pelvic lymph nodes. Reproductive: Multiple calcified uterine fibroids again noted. No adnexal mass. Other: Small fat containing left inguinal hernia. No free fluid or pneumoperitoneum. Musculoskeletal: New diffuse cortical and trabecular thickening involving the sacrum and pelvis, asymmetric to the right. Prior right total hip arthroplasty. Review of the MIP images confirms the above findings. IMPRESSION: 1. No evidence of acute aortic syndrome. No acute intrathoracic or intra-abdominal process. 2. New diffuse cortical and trabecular thickening involving the sacrum and pelvis,  consistent with Paget's disease. Electronically Signed   By: Obie Dredge M.D.   On: 07/05/2022 12:05   DG Chest 2 View  Result Date: 07/05/2022 CLINICAL DATA:  Chest pain for a week. History of breast cancer status post mastectomy. EXAM: CHEST - 2 VIEW COMPARISON:  Chest x-ray dated 06/11/2017 FINDINGS: Borderline cardiomegaly, stable. Lungs appear clear. No pleural effusion or pneumothorax is seen. Osseous structures about the chest are unremarkable. IMPRESSION: No active cardiopulmonary disease. No evidence of pneumonia or pulmonary edema. Borderline cardiomegaly. Electronically Signed   By: Bary Richard M.D.   On: 07/05/2022 09:33    Procedures Procedures    Medications Ordered in ED Medications  sodium chloride 0.9 % bolus 500 mL (0 mLs Intravenous Stopped 07/05/22 1151)  nitroGLYCERIN (NITROSTAT) SL tablet 0.4 mg (0.4 mg Sublingual Given 07/05/22 1033)  iohexol (OMNIPAQUE) 350 MG/ML injection 100 mL (100 mLs Intravenous Contrast Given 07/05/22 1126)  fentaNYL (SUBLIMAZE) injection 50 mcg (50 mcg Intravenous Given 07/05/22 1153)    ED Course/ Medical Decision Making/ A&P Clinical Course as of 07/05/22 1415  Sun Jul 05, 2022  1006 This is a very pleasant 77 year old female with a history of congestive heart failure presenting to the ED with left-sided chest pain ongoing for a week, which is not positional or exertional in nature, can also occur at rest, but radiates sharply into her back.  She also describes associated heaviness and numbness in her left arm.  She denies nausea or vomiting.  She denies abdominal pain.  She reports some pain radiating into her left neck.  She has never had the symptoms before.  She is prescribed nitroglycerin for anginal chest pain and ran out of this, but says that this pain feels different and different than her typical anginal pain.  Her vitals are largely unremarkable in the ED.  Her EKG shows normal sinus rhythm no acute ischemic findings.  Her initial  troponin is negative and her chest x-ray per my review was unremarkable.  However with this presentation I think a dissection study would be reasonable to evaluate for aortic pathology.  Will also try some sublingual nitroglycerin initially here. [MT]    Clinical Course User Index [MT] Trifan, Kermit Balo, MD  Medical Decision Making 77 year old female history as above presented for evaluation of chest pain radiating to the neck and left shoulder has been intermittent for 1 week.  On evaluation she is in no acute distress vital signs are stable on room air.  She does endorse some tingling to her left arm.  She has no additional concerns.  Plan to obtain cardiac workup in addition to a dissection study.  Patient seen and evaluated by attending physician Dr. Renaye Rakers.  Will additionally give patient some sublingual nitro to see if this helps with her symptoms.  Differential includes but not limited to ACS, dissection, PNA, CHF exacerbation, PE, costochondritis, GERD.  Amount and/or Complexity of Data Reviewed Labs: ordered.    Details: CBC without leukocytosis to suggest infectious process.  No anemia.  No thrombocytopenia. BMP shows no emergent electrolyte derangement, AKI or gap. Initial and delta high-sensitivity troponins within normal limits. Radiology: ordered.    Details: I personally reviewed and interpreted patient's two-view chest x-ray.  I do not appreciate any obvious PTX, PNA or effusion. I personally reviewed patient's CT dissection study.  I do not appreciate any obvious dissection.  Please see radiologist interpretation. ECG/medicine tests:     Details: I personally reviewed and interpreted patient's twelve-lead EKG.  I do not appreciate any obvious acute ischemic changes.  Risk Prescription drug management. Risk Details: Patient reassessed she is resting comfortably and in no acute distress.  She initially had no improvement after sublingual nitro and she  was given a small dose of fentanyl.  On reassessment she reports her symptoms are completely resolved.  Her vital signs are reassuring.  She is requesting discharge.  With 2 negative troponins have a low suspicion for ACS as cause of her pain.  CT dissection study today is also reassuring.  I have a low suspicion for ACS, PE, dissection or other emergent cardiopulmonary etiologies of her symptoms at this time.  Patient will be encouraged to closely follow-up with her cardiologist this week for recheck and she was given strict ER precautions.  All questions were answered.  She had no further concerns.  Patient was seen and evaluated by attending physician Dr. Renaye Rakers during this visit who agrees with workup and discharge at this time.    At this time there does not appear to be any evidence of an acute emergency medical condition and the patient appears stable for discharge with appropriate outpatient follow up. Diagnosis was discussed with patient who verbalizes understanding of care plan and is agreeable to discharge. I have discussed return precautions with patient and family at bedside who verbalizes understanding. Patient encouraged to follow-up with their PCP. All questions answered.    Note: Portions of this report may have been transcribed using voice recognition software. Every effort was made to ensure accuracy; however, inadvertent computerized transcription errors may still be present.         Final Clinical Impression(s) / ED Diagnoses Final diagnoses:  Chest pain, unspecified type    Rx / DC Orders ED Discharge Orders     None         Elizabeth Palau 07/05/22 1415    Elizabeth Palau 07/05/22 1416    Terald Sleeper, MD 07/05/22 [phone]

## 2022-07-05 NOTE — ED Triage Notes (Signed)
Patient complains of intermittent chest pain x 1 week. States this am had nausea with same. Has taken 2 baby asa prior to arrival. Alert and oriented

## 2022-07-05 NOTE — ED Notes (Signed)
Patient transported to X-ray 

## 2022-07-05 NOTE — Discharge Instructions (Addendum)
At this time there does not appear to be the presence of an emergent medical condition, however there is always the potential for conditions to change. Please read and follow the below instructions.  Please return to the Emergency Department immediately for any new or worsening symptoms. Please be sure to follow up with your Primary Care Provider within one week regarding your visit today; please call their office to schedule an appointment even if you are feeling better for a follow-up visit. Your CT scan today showed some bronchiectasis.  Also showed left chest wall fluid which is likely due to your postoperative seroma.  Your CT scan showed plaque buildup within your arteries.  Your CT scan also showed some diverticulosis as well as uterine fibroids and a small inguinal hernia.  There was some thickening of the bone of your sacrum and pelvis which could be due to Paget's disease.  Please discuss this with your doctor at your follow-up appointment. Please call your cardiologist today to tell them that you are in the emergency department and to schedule a follow-up appointment.   Please read the additional information packets attached to your discharge summary.  Go to the nearest Emergency Department immediately if: You have fever or chills Your chest pain returns You have a cough that gets worse, or you cough up blood. You have very bad (severe) pain in your belly (abdomen). You pass out (faint). You have either of these for no clear reason: Sudden chest discomfort. Sudden discomfort in your arms, back, neck, or jaw. You have shortness of breath at any time. You suddenly start to sweat, or your skin gets clammy. You feel sick to your stomach (nauseous). You throw up (vomit). You suddenly feel lightheaded or dizzy. You feel very weak or tired. Your heart starts to beat fast, or it feels like it is skipping beats.  Do not take your medicine if  develop an itchy rash, swelling in your mouth  or lips, or difficulty breathing; call 911 and seek immediate emergency medical attention if this occurs.  You may review your lab tests and imaging results in their entirety on your MyChart account.  Please discuss all results of fully with your primary care provider and other specialist at your follow-up visit.  Note: Portions of this text may have been transcribed using voice recognition software. Every effort was made to ensure accuracy; however, inadvertent computerized transcription errors may still be present.

## 2022-07-06 NOTE — Progress Notes (Signed)
COVID Vaccine Completed: yes  Date of COVID positive in last 90 days:  PCP - Rodman Pickle, NP Cardiologist - Gypsy Balsam, MD LOV 10/29/21  Chest x-ray - 07/05/22 Epic EKG - 07/05/22 Epic Stress Test - 07/24/21 Epic ECHO - 07/14/21 Epic Cardiac Cath - 2013 CEW Pacemaker/ICD device last checked:n/a Spinal Cord Stimulator: n/a  Bowel Prep - no  Sleep Study - n/a CPAP -   Fasting Blood Sugar - 98-150 Checks Blood Sugar 2 times a day  Last dose of GLP1 agonist-  Ozempic every Saturday  GLP1 instructions:  hold 7 days, last dose 06/27/22, held 07/04/22   Last dose of SGLT-2 inhibitors-  N/A SGLT-2 instructions: N/A   Blood Thinner Instructions: Aspirin Instructions: ASA 81,  no instructions per pt Last Dose:  Activity level: Can go up a flight of stairs and perform activities of daily living without stopping and without symptoms of chest pain or shortness of breath.   Anesthesia review: HTN, CHF, CAD, DM2, anemia, MI  Patient denies shortness of breath, fever, cough and chest pain at PAT appointment  Patient verbalized understanding of instructions that were given to them at the PAT appointment. Patient was also instructed that they will need to review over the PAT instructions again at home before surgery.

## 2022-07-06 NOTE — Patient Instructions (Signed)
SURGICAL WAITING ROOM VISITATION  Patients having surgery or a procedure may have no more than 2 support people in the waiting area - these visitors may rotate.    Children under the age of 16 must have an adult with them 55who is not the patient.  Due to an increase in RSV and influenza rates and associated hospitalizations, children ages 73 and under may not visit patients in [facility] hospitals.  If the patient needs to stay at the hospital during part of their recovery, the visitor guidelines for inpatient rooms apply. Pre-op nurse will coordinate an appropriate time for 1 support person to accompany patient in pre-op.  This support person may not rotate.    Please refer to the [facility] website for the visitor guidelines for Inpatients (after your surgery is over and you are in a regular room).    Your procedure is scheduled on: 07/10/22   Report to [facility] Main Entrance    Report to admitting at 6:15 AM   Call this number if you have problems the morning of surgery [phone]   Do not eat food :After Midnight.   After Midnight you may have the following liquids until 5:30 AM DAY OF SURGERY  Water Non-Citrus Juices (without pulp, NO RED-Apple, White grape, White cranberry) Black Coffee (NO MILK/CREAM OR CREAMERS, sugar ok)  Clear Tea (NO MILK/CREAM OR CREAMERS, sugar ok) regular and decaf                             Plain Jell-O (NO RED)                                           Fruit ices (not with fruit pulp, NO RED)                                     Popsicles (NO RED)                                                               Sports drinks like Gatorade (NO RED)                      If you have questions, please contact your surgeon's office.   FOLLOW BOWEL PREP AND ANY ADDITIONAL PRE OP INSTRUCTIONS YOU RECEIVED FROM YOUR SURGEON'S OFFICE!!!     Oral Hygiene is also important to reduce your risk of infection.                                     Remember - BRUSH YOUR TEETH THE MORNING OF SURGERY WITH YOUR REGULAR TOOTHPASTE  DENTURES WILL BE REMOVED PRIOR TO SURGERY PLEASE DO NOT APPLY "Poly grip" OR ADHESIVES!!!   Take these medicines the morning of surgery with A SIP OF WATER: Atorvastatin, Zyrtec, Nexium, Flonase  DO NOT TAKE ANY ORAL DIABETIC MEDICATIONS DAY OF YOUR SURGERY  How to Manage Your Diabetes Before and After Surgery  Why is it important to control my blood sugar before and after surgery? Improving blood sugar levels before and after surgery helps healing and can limit problems. A way of improving blood sugar control is eating a healthy diet by:  Eating less sugar and carbohydrates  Increasing activity/exercise  Talking with your doctor about reaching your blood sugar goals High blood sugars (greater than 180 mg/dL) can raise your risk of infections and slow your recovery, so you will need to focus on controlling your diabetes during the weeks before surgery. Make sure that the doctor who takes care of your diabetes knows about your planned surgery including the date and location.  How do I manage my blood sugar before surgery? Check your blood sugar at least 4 times a day, starting 2 days before surgery, to make sure that the level is not too high or low. Check your blood sugar the morning of your surgery when you wake up and every 2 hours until you get to the Short Stay unit. If your blood sugar is less than 70 mg/dL, you will need to treat for low blood sugar: Do not take insulin. Treat a low blood sugar (less than 70 mg/dL) with  cup of clear juice (cranberry or apple), 4 glucose tablets, OR glucose gel. Recheck blood sugar in 15 minutes after treatment (to make sure it is greater than 70 mg/dL). If your blood sugar is not greater than 70 mg/dL on recheck, call [phone] for further instructions. Report your blood sugar to the short stay nurse when you get to Short Stay.  If you are admitted to the  hospital after surgery: Your blood sugar will be checked by the staff and you will probably be given insulin after surgery (instead of oral diabetes medicines) to make sure you have good blood sugar levels. The goal for blood sugar control after surgery is 80-180 mg/dL.   WHAT DO I DO ABOUT MY DIABETES MEDICATION?  Do not take oral diabetes medicines (pills) the morning of surgery.  Hold Jardiance for 3 days before surgery.  THE DAY BEFORE SURGERY, take morning dose of Lantus as prescribed. Take 50% of evening dose     THE MORNING OF SURGERY, take 50% of Lantus  DO NOT TAKE THE FOLLOWING 7 DAYS PRIOR TO SURGERY: Ozempic, Wegovy, Rybelsus (Semaglutide), Byetta (exenatide), Bydureon (exenatide ER), Victoza, Saxenda (liraglutide), or Trulicity (dulaglutide) Mounjaro (Tirzepatide) Adlyxin (Lixisenatide), Polyethylene Glycol Loxenatide.  Reviewed and Endorsed by [facility] Patient Education Committee, August 2015                              You may not have any metal on your body including hair pins, jewelry, and body piercing             Do not wear make-up, lotions, powders, perfumes, or deodorant  Do not wear nail polish including gel and S&S, artificial/acrylic nails, or any other type of covering on natural nails including finger and toenails. If you have artificial nails, gel coating, etc. that needs to be removed by a nail salon please have this removed prior to surgery or surgery may need to be canceled/ delayed if the surgeon/ anesthesia feels like they are unable to be safely monitored.   Do not shave  48 hours prior to surgery.    Do not bring valuables to the hospital. [facility] IS NOT  RESPONSIBLE   FOR VALUABLES.   Contacts, glasses, dentures or bridgework may not be worn into surgery.  DO NOT BRING YOUR HOME MEDICATIONS TO THE HOSPITAL. PHARMACY WILL DISPENSE MEDICATIONS LISTED ON YOUR MEDICATION LIST TO YOU DURING YOUR ADMISSION IN THE  HOSPITAL!    Patients discharged on the day of surgery will not be allowed to drive home.  Someone NEEDS to stay with you for the first 24 hours after anesthesia.   Special Instructions: Bring a copy of your healthcare power of attorney and living will documents the day of surgery if you haven't scanned them before.              Please read over the following fact sheets you were given: IF YOU HAVE QUESTIONS ABOUT YOUR PRE-OP INSTRUCTIONS PLEASE CALL [phone] [place]  If you received a COVID test during your pre-op visit  it is requested that you wear a mask when out in public, stay away from anyone that may not be feeling well and notify your surgeon if you develop symptoms. If you test positive for Covid or have been in contact with anyone that has tested positive in the last 10 days please notify you surgeon.    [facility] - Preparing for Surgery Before surgery, you can play an important role.  Because skin is not sterile, your skin needs to be as free of germs as possible.  You can reduce the number of germs on your skin by washing with CHG (chlorahexidine gluconate) soap before surgery.  CHG is an antiseptic cleaner which kills germs and bonds with the skin to continue killing germs even after washing. Please DO NOT use if you have an allergy to CHG or antibacterial soaps.  If your skin becomes reddened/irritated stop using the CHG and inform your nurse when you arrive at Short Stay. Do not shave (including legs and underarms) for at least 48 hours prior to the first CHG shower.  You may shave your face/neck.  Please follow these instructions carefully:  1.  Shower with CHG Soap the night before surgery and the  morning of surgery.  2.  If you choose to wash your hair, wash your hair first as usual with your normal  shampoo.  3.  After you shampoo, rinse your hair and body thoroughly to remove the shampoo.                             4.  Use CHG as you would any other liquid soap.  You  can apply chg directly to the skin and wash.  Gently with a scrungie or clean washcloth.  5.  Apply the CHG Soap to your body ONLY FROM THE NECK DOWN.   Do   not use on face/ open                           Wound or open sores. Avoid contact with eyes, ears mouth and   genitals (private parts).                       Wash face,  Genitals (private parts) with your normal soap.             6.  Wash thoroughly, paying special attention to the area where your    surgery  will be performed.  7.  Thoroughly rinse your body with warm water  from the neck down.  8.  DO NOT shower/wash with your normal soap after using and rinsing off the CHG Soap.                9.  Pat yourself dry with a clean towel.            10.  Wear clean pajamas.            11.  Place clean sheets on your bed the night of your first shower and do not  sleep with pets. Day of Surgery : Do not apply any lotions/deodorants the morning of surgery.  Please wear clean clothes to the hospital/surgery center.  FAILURE TO FOLLOW THESE INSTRUCTIONS MAY RESULT IN THE CANCELLATION OF YOUR SURGERY  PATIENT SIGNATURE_________________________________  NURSE SIGNATURE__________________________________  ________________________________________________________________________

## 2022-07-08 ENCOUNTER — Other Ambulatory Visit: Payer: Self-pay

## 2022-07-08 ENCOUNTER — Other Ambulatory Visit (HOSPITAL_COMMUNITY)
Admission: RE | Admit: 2022-07-08 | Discharge: 2022-07-08 | Disposition: A | Discharge status: Home / Self Care | Payer: Medicare HMO | Source: Ambulatory Visit | Attending: Obstetrics and Gynecology | Admitting: Obstetrics and Gynecology

## 2022-07-08 ENCOUNTER — Other Ambulatory Visit: Payer: Self-pay | Admitting: Obstetrics and Gynecology

## 2022-07-08 ENCOUNTER — Telehealth: Payer: Self-pay | Admitting: Cardiology

## 2022-07-08 ENCOUNTER — Telehealth: Payer: Self-pay

## 2022-07-08 ENCOUNTER — Encounter (HOSPITAL_COMMUNITY)
Admission: RE | Admit: 2022-07-08 | Discharge: 2022-07-08 | Disposition: A | Discharge status: Home / Self Care | Payer: Medicare HMO | Source: Ambulatory Visit | Attending: General Surgery | Admitting: General Surgery

## 2022-07-08 ENCOUNTER — Encounter (HOSPITAL_COMMUNITY): Payer: Self-pay

## 2022-07-08 VITALS — BP 159/80 | HR 74 | Temp 98.6°F | Resp 16 | Ht 61.0 in | Wt 158.0 lb

## 2022-07-08 DIAGNOSIS — Z01419 Encounter for gynecological examination (general) (routine) without abnormal findings: Secondary | ICD-10-CM | POA: Insufficient documentation

## 2022-07-08 DIAGNOSIS — L900 Lichen sclerosus et atrophicus: Secondary | ICD-10-CM | POA: Diagnosis not present

## 2022-07-08 DIAGNOSIS — Z794 Long term (current) use of insulin: Secondary | ICD-10-CM

## 2022-07-08 DIAGNOSIS — R69 Illness, unspecified: Secondary | ICD-10-CM | POA: Diagnosis not present

## 2022-07-08 DIAGNOSIS — D259 Leiomyoma of uterus, unspecified: Secondary | ICD-10-CM | POA: Diagnosis not present

## 2022-07-08 DIAGNOSIS — R102 Pelvic and perineal pain: Secondary | ICD-10-CM | POA: Diagnosis not present

## 2022-07-08 DIAGNOSIS — Z1151 Encounter for screening for human papillomavirus (HPV): Secondary | ICD-10-CM | POA: Diagnosis not present

## 2022-07-08 LAB — GLUCOSE, CAPILLARY: Glucose-Capillary: 93 mg/dL (ref 70–99)

## 2022-07-08 NOTE — Telephone Encounter (Signed)
  Pt c/o swelling: STAT is pt has developed SOB within 24 hours  If swelling, where is the swelling located? Both feet  How much weight have you gained and in what time span?   Have you gained 3 pounds in a day or 5 pounds in a week?   Do you have a log of your daily weights (if so, list)?   Are you currently taking a fluid pill? No   Are you currently SOB? No   Have you traveled recently? No   Pt said, both of her feet is swelling and would like to ask Dr. Kirtland Bouchard is he can prescribed fluid pill for her

## 2022-07-08 NOTE — Telephone Encounter (Signed)
     Patient  visit on 4/14  at [facility]   Have you been able to follow up with your primary care physician? No   The patient was or was not able to obtain any needed medicine or equipment. Yes   Are there diet recommendations that you are having difficulty following? No   Patient expresses understanding of discharge instructions and education provided has no other needs at this time.  Yes      Lenard Forth [facility] Guide, [facility] [phone] [address] [place], [place], [place] [address] Phone: [phone] Email: [email or web address]

## 2022-07-08 NOTE — Progress Notes (Signed)
 " Cardiology Office Note:    Date:  07/09/2022   ID:  Bonnie Reed, DOB 07-01-1945, MRN [id number]  PCP:  Nedra Tinnie LABOR, NP   [facility] Providers Cardiologist:  Lamar Fitch, MD     Referring MD: Nedra Tinnie LABOR, NP  CC: recent ED visit f/u   History of Present Illness:    Bonnie Reed is a 77 y.o. female with a hx of hypertension, congestive heart failure, CAD s/p LHC in 2012 revealing moderate stenosis of LAD, GERD, DM 2, anemia, depression, hyperlipidemia, breast cancer.  Previously established with [facility] as far back as 2012, she had had an abnormal nuclear stress test.  She underwent LHC on 02/04/2011 which showed moderate disease in the LAD at areas where the vessel bends, not suitable for intervention.  Most recently she establish care with Dr. Fitch in May 2023 at the behest of her oncologist Dr. Timmy for preoperative evaluation for upcoming mastectomy related to breast cancer.  Repeat echo at this time revealed an EF 60 to 65%, moderate concentric LVH, grade 1 DD, trivial MR, mild thickening of the aortic valve.  MPI on 07/24/2021 was low risk study, normal.  Evaluated in the ED on 07/05/2022 for chest pain which has been occurring for the previous week.  She described it as heaviness and numbness in her left arm, denied nausea vomiting abdominal pain.  Reported some radiation to the left side of her neck.  EKG showed sinus rhythm with no acute ischemic findings.  Troponin negative, chest x-ray was unremarkable.  CTA for possible dissection was negative.  She was given a nitroglycerin , this did not help her pain however eventually the pain did subside while she was in the ED.  She was ultimately discharged home.  Ms. Comp presents today for follow-up after recent ED evaluation.  For the last week she has noticed left-sided chest pain that typically starts sharp in nature and then progresses to dull, she is also noticed pain on the left side of her  neck.  She states that approximately 2 weeks ago she started moving heavy plants around at her home.  She is also dealing with recurrent seroma of her left chest wall, that has had to be drained approximately 4 times since her mastectomy in June 2023.  She is scheduled to have a drain placed on 07/10/2022. She denies palpitations, dyspnea, pnd, orthopnea, n, v, dizziness, syncope, edema, weight gain, or early satiety.   Past Medical History:  Diagnosis Date   Allergy    Anemia    during knee replacement 15 years ago   Blood transfusion without reported diagnosis    Breast cancer 1997   Left Breast Cancer   Cancer    Cervical spondylosis    CHF (congestive heart failure)    Coronary artery disease involving native coronary artery of native heart without angina pectoris    COVID 04/2021   mild case   Diabetes mellitus without complication    Diverticulosis    GERD (gastroesophageal reflux disease)    without esophagitis   History of breast cancer    History of kidney stones    Hyperlipidemia    Hypertension    Lumbar spondylosis    Osteoarthritis    right knee   Osteopenia due to cancer therapy 09/30/2021   Other eczema    Personal history of radiation therapy 1997   Left Breast Cancer   Recurrent major depressive disorder    Thyroid  goiter  Past Surgical History:  Procedure Laterality Date   APPENDECTOMY     BREAST BIOPSY Right 08/01/2021   BREAST LUMPECTOMY Left 1997   CESAREAN SECTION     CHOLECYSTECTOMY     HEMATOMA EVACUATION Left 03/12/2022   Procedure: PLACEMENT OF DRAIN LEFT CHEST WALL;  Surgeon: Curvin Deward MOULD, MD;  Location: [facility] OR;  Service: General;  Laterality: Left;   KNEE ARTHROSCOPY Right    LESION REMOVAL Left 08/25/2021   Procedure: EXCISION SKIN LESION LEFT CHEST WALL;  Surgeon: Curvin Deward MOULD, MD;  Location: [facility] OR;  Service: General;  Laterality: Left;   REDUCTION MAMMAPLASTY Right 1998   SIMPLE MASTECTOMY WITH AXILLARY SENTINEL NODE BIOPSY Left  08/25/2021   Procedure: LEFT MASTECTOMY;  Surgeon: Curvin Deward MOULD, MD;  Location: [facility] OR;  Service: General;  Laterality: Left;   TOTAL HIP ARTHROPLASTY Right    TUBAL LIGATION      Current Medications: Current Meds  Medication Sig   ACCU-CHEK SOFTCLIX LANCETS lancets Use to check blood sugar three times daily or as directed. (Patient taking differently: 1 each by Other route See admin instructions. Use to check blood sugar three times daily or as directed.)   aspirin EC 81 MG tablet Take 81 mg by mouth in the morning.   atorvastatin  (LIPITOR) 40 MG tablet Take 1 tablet (40 mg total) by mouth daily.   BD PEN NEEDLE NANO 2ND GEN 32G X 4 MM MISC USE AS DIRECTED THREE TIMES DAILY   Blood Glucose Monitoring Suppl (GLUCOCOM BLOOD GLUCOSE MONITOR) DEVI 1 each by Other route daily.   Carboxymethylcell-Hypromellose (GENTEAL OP) Place 1 drop into both eyes daily as needed (dry eyes).   cetirizine (ZYRTEC) 10 MG tablet Take 10 mg by mouth daily as needed for allergies.   denosumab  (PROLIA ) 60 MG/ML SOSY injection Inject 60 mg into the skin every 6 (six) months.   empagliflozin  (JARDIANCE ) 10 MG TABS tablet Take 1 tablet (10 mg total) by mouth daily.   esomeprazole  (NEXIUM ) 40 MG capsule Take 1 capsule (40 mg total) by mouth daily.   fluticasone  (FLONASE ) 50 MCG/ACT nasal spray Place 2 sprays into both nostrils daily as needed for allergies. (Patient taking differently: Place 2 sprays into both nostrils in the morning and at bedtime.)   folic acid  (FOLVITE ) 1 MG tablet Take 1 tablet (1 mg total) by mouth daily.   ibuprofen  (ADVIL ) 200 MG tablet Take 400 mg by mouth every 8 (eight) hours as needed (pain.).   insulin  glargine (LANTUS  SOLOSTAR) 100 UNIT/ML Solostar Pen Inject 20 Units into the skin 2 (two) times daily.   Lancet Devices (ONE TOUCH DELICA LANCING DEV) MISC Use to test 3 times a day Dx:E11.9 (Patient taking differently: 1 each by Other route See admin instructions. Use to test 3 times a day  Dx:E11.9)   letrozole  (FEMARA ) 2.5 MG tablet Take 1 tablet (2.5 mg total) by mouth daily.   LINZESS  145 MCG CAPS capsule TAKE 1 CAPSULE BY MOUTH ONCE DAILY AS NEEDED FOR CONSTIPATION   nitroGLYCERIN  (NITROSTAT ) 0.4 MG SL tablet Place 1 tablet (0.4 mg total) under the tongue every 5 (five) minutes as needed for chest pain.   OZEMPIC , 2 MG/DOSE, 8 MG/3ML SOPN INJECT 2MG  INTO THE SKIN ONCE WEEKLY (Patient taking differently: Inject 2 mg into the skin every Saturday.)   traZODone  (DESYREL ) 50 MG tablet Take 0.5-1 tablets (25-50 mg total) by mouth at bedtime as needed for sleep. (Patient taking differently: Take 50 mg by mouth at bedtime  as needed for sleep.)   valsartan  (DIOVAN ) 80 MG tablet Take 1 tablet (80 mg total) by mouth daily.   Vitamin D , Ergocalciferol , (DRISDOL) 1.25 MG (50000 UNIT) CAPS capsule Take 50,000 Units by mouth every Monday.     Allergies:   Chlorpheniramine and Phenylpropanol   Social History   Socioeconomic History   Marital status: Divorced    Spouse name: Not on file   Number of children: 4   Years of education: Not on file   Highest education level: Not on file  Occupational History   Occupation: Retired  Tobacco Use   Smoking status: Never   Smokeless tobacco: Never  Vaping Use   Vaping Use: Never used  Substance and Sexual Activity   Alcohol use: No   Drug use: No   Sexual activity: Not Currently  Other Topics Concern   Not on file  Social History Narrative   Not on file   Social Determinants of Health   Financial Resource Strain: Low Risk  (04/20/2022)   Overall Financial Resource Strain (CARDIA)    Difficulty of Paying Living Expenses: Not hard at all  Food Insecurity: Food Insecurity Present (04/20/2022)   Hunger Vital Sign    Worried About Running Out of Food in the Last Year: Often true    Ran Out of Food in the Last Year: Often true  Transportation Needs: No Transportation Needs (04/20/2022)   PRAPARE - Scientist, Research (physical Sciences) (Medical): No    Lack of Transportation (Non-Medical): No  Physical Activity: Insufficiently Active (04/20/2022)   Exercise Vital Sign    Days of Exercise per Week: 4 days    Minutes of Exercise per Session: 30 min  Stress: No Stress Concern Present (04/20/2022)   [employer] of Occupational Health - Occupational Stress Questionnaire    Feeling of Stress : Not at all  Social Connections: Not on file     Family History: The patient's family history includes Cancer in her father; Dementia in her mother; Kidney disease in her mother; Stroke in her mother. There is no history of Colon cancer or Rectal cancer.  ROS:   Please see the history of present illness.    All other systems reviewed and are negative.  EKGs/Labs/Other Studies Reviewed:    The following studies were reviewed today: Cardiac Studies & Procedures     STRESS TESTS  MYOCARDIAL PERFUSION IMAGING 07/24/2021  Narrative   The study is normal. The study is low risk.   Left ventricular function is normal. Nuclear stress EF: 62 %. The left ventricular ejection fraction is normal (55-65%). End diastolic cavity size is normal.   Prior study not available for comparison.   ECHOCARDIOGRAM  ECHOCARDIOGRAM COMPLETE 07/14/2021  Narrative ECHOCARDIOGRAM REPORT    Patient Name:   Maryl HADAR ELGERSMA Date of Exam: 07/14/2021 Medical Rec #:  [id number]          Height:       61.0 in Accession #:    [id number]         Weight:       176.0 lb Date of Birth:  [birth year]          BSA:          1.789 m Patient Age:    76 years           BP:           146/63 mmHg Patient Gender: F  HR:           77 bpm. Exam Location:  Outpatient  Procedure: 2D Echo, Cardiac Doppler and Color Doppler  Indications:    CHF  History:        Patient has no prior history of Echocardiogram examinations. CHF; Risk Factors:Hypertension and Diabetes.  Sonographer:    Waddell Captain Referring Phys: [id number] PETER R  ENNEVER   Sonographer Comments: Image acquisition challenging due to respiratory motion. Global longitudinal strain was attempted. IMPRESSIONS   1. Left ventricular ejection fraction, by estimation, is 60 to 65%. The left ventricle has normal function. The left ventricle has no regional wall motion abnormalities. There is moderate concentric left ventricular hypertrophy. Left ventricular diastolic parameters are consistent with Grade I diastolic dysfunction (impaired relaxation). 2. Right ventricular systolic function is normal. The right ventricular size is normal. Tricuspid regurgitation signal is inadequate for assessing PA pressure. 3. The mitral valve is abnormal. Trivial mitral valve regurgitation. Moderate mitral annular calcification. 4. The aortic valve is tricuspid. There is mild calcification of the aortic valve. There is mild thickening of the aortic valve. Aortic valve regurgitation is mild. Aortic valve sclerosis/calcification is present, without any evidence of aortic stenosis. 5. The inferior vena cava is normal in size with greater than 50% respiratory variability, suggesting right atrial pressure of 3 mmHg.  FINDINGS Left Ventricle: Left ventricular ejection fraction, by estimation, is 60 to 65%. The left ventricle has normal function. The left ventricle has no regional wall motion abnormalities. Global longitudinal strain performed but not reported based on interpreter judgement due to suboptimal tracking. The left ventricular internal cavity size was normal in size. There is moderate concentric left ventricular hypertrophy. Left ventricular diastolic parameters are consistent with Grade I diastolic dysfunction (impaired relaxation).  Right Ventricle: The right ventricular size is normal. No increase in right ventricular wall thickness. Right ventricular systolic function is normal. Tricuspid regurgitation signal is inadequate for assessing PA pressure.  Left Atrium: Left  atrial size was normal in size.  Right Atrium: Right atrial size was normal in size.  Pericardium: There is no evidence of pericardial effusion.  Mitral Valve: The mitral valve is abnormal. There is mild thickening of the mitral valve leaflet(s). There is mild calcification of the mitral valve leaflet(s). Moderate mitral annular calcification. Trivial mitral valve regurgitation.  Tricuspid Valve: The tricuspid valve is normal in structure. Tricuspid valve regurgitation is trivial.  Aortic Valve: The aortic valve is tricuspid. There is mild calcification of the aortic valve. There is mild thickening of the aortic valve. Aortic valve regurgitation is mild. Aortic valve sclerosis/calcification is present, without any evidence of aortic stenosis. Aortic valve peak gradient measures 8.9 mmHg.  Pulmonic Valve: The pulmonic valve was normal in structure. Pulmonic valve regurgitation is trivial.  Aorta: The aortic root and ascending aorta are structurally normal, with no evidence of dilitation.  Venous: The inferior vena cava is normal in size with greater than 50% respiratory variability, suggesting right atrial pressure of 3 mmHg.  IAS/Shunts: There is redundancy of the interatrial septum. The atrial septum is grossly normal.   LEFT VENTRICLE PLAX 2D LVIDd:         4.00 cm     Diastology LVIDs:         2.60 cm     LV e' medial:    4.03 cm/s LV PW:         1.40 cm     LV E/e' medial:  14.2 LV IVS:  1.20 cm     LV e' lateral:   4.35 cm/s LVOT diam:     2.00 cm     LV E/e' lateral: 13.2 LV SV:         67 LV SV Index:   37 LVOT Area:     3.14 cm  LV Volumes (MOD) LV vol d, MOD A2C: 80.0 ml LV vol d, MOD A4C: 64.2 ml LV vol s, MOD A2C: 30.9 ml LV vol s, MOD A4C: 24.6 ml LV SV MOD A2C:     49.1 ml LV SV MOD A4C:     64.2 ml LV SV MOD BP:      46.3 ml  RIGHT VENTRICLE             IVC RV Basal diam:  3.20 cm     IVC diam: 1.10 cm RV Mid diam:    2.50 cm RV S prime:     12.00  cm/s TAPSE (M-mode): 2.1 cm  LEFT ATRIUM             Index        RIGHT ATRIUM           Index LA diam:        3.20 cm 1.79 cm/m   RA Area:     13.60 cm LA Vol (A2C):   39.8 ml 22.25 ml/m  RA Volume:   29.60 ml  16.55 ml/m LA Vol (A4C):   37.6 ml 21.02 ml/m LA Biplane Vol: 39.2 ml 21.91 ml/m AORTIC VALVE AV Area (Vmax): 2.05 cm AV Vmax:        149.00 cm/s AV Peak Grad:   8.9 mmHg LVOT Vmax:      97.30 cm/s LVOT Vmean:     76.900 cm/s LVOT VTI:       0.212 m  AORTA Ao Root diam: 2.80 cm Ao Asc diam:  2.80 cm  MITRAL VALVE MV Area (PHT): 3.07 cm    SHUNTS MV Decel Time: 247 msec    Systemic VTI:  0.21 m MV E velocity: 57.40 cm/s  Systemic Diam: 2.00 cm MV A velocity: 91.30 cm/s MV E/A ratio:  0.63  Powell Sorrow MD Electronically signed by Powell Sorrow MD Signature Date/Time: 07/14/2021/2:27:30 PM    Final              EKG:  EKG is not ordered today, reviewed EKG from ED visit on 07/05/2022, normal sinus rhythm with left axis deviation, heart rate 76 bpm, consistent with prior EKG tracings.  Recent Labs: 05/08/2022: ALT 10 07/05/2022: BUN 16; Creatinine, Ser 0.92; Hemoglobin 12.4; Platelets 321; Potassium 4.0; Sodium 140  Recent Lipid Panel    Component Value Date/Time   CHOL 143 01/26/2022 1014   CHOL 162 02/08/2019 1049   TRIG 83.0 01/26/2022 1014   HDL 40.00 01/26/2022 1014   HDL 41 02/08/2019 1049   CHOLHDL 4 01/26/2022 1014   VLDL 16.6 01/26/2022 1014   LDLCALC 86 01/26/2022 1014   LDLCALC 103 (H) 02/08/2019 1049     Risk Assessment/Calculations:                Physical Exam:    VS:  BP 120/68 (BP Location: Right Arm, Patient Position: Sitting, Cuff Size: Normal)   Pulse 90   Ht 5' 1 (1.549 m)   Wt 159 lb (72.1 kg)   LMP 10/17/1991 (Exact Date)   SpO2 97%   BMI 30.04 kg/m     Wt Readings from Last  3 Encounters:  07/09/22 159 lb (72.1 kg)  07/08/22 158 lb (71.7 kg)  07/05/22 157 lb (71.2 kg)     GEN: appears younger than  stated age,  well nourished, well developed in no acute distress HEENT: Normal NECK: No JVD; No carotid bruits LYMPHATICS: No lymphadenopathy CARDIAC: RRR, no murmurs, rubs, gallops RESPIRATORY:  Clear to auscultation without rales, wheezing or rhonchi  ABDOMEN: Soft, non-tender, non-distended MUSCULOSKELETAL:  No edema; No deformity.  Left chest wall with bandage, exquisitely tender to palpation or movement. SKIN: Warm and dry NEUROLOGIC:  Alert and oriented x 3 PSYCHIATRIC:  Normal affect   ASSESSMENT:    1. Precordial pain   2. Coronary artery disease involving native coronary artery of native heart without angina pectoris   3. Primary hypertension   4. Malignant neoplasm of lower-inner quadrant of left breast in female, estrogen receptor positive   5. Chronic systolic congestive heart failure   6. Preoperative cardiovascular examination    PLAN:    In order of problems listed above:  Precordial pain-s/p mastectomy in June 2023, she has had recurrent seromas which have required draining x 4, plans for a drain to be placed tomorrow under conscious sedation by [facility] surgery.  Upon palpation, she is exquisitely tender, when I manually abduct her left arm she winces in pain.  Pain is consistent with musculoskeletal pain.  She has taken ibuprofen , that is the only thing that is currently helping her.  She is placing ice on her mastectomy site which helps some.  CAD -s/p LHC in 2012 revealing moderate stenosis of LAD.  During most recent ED visit troponin was negative, EKG unremarkable.  Ischemic evaluation in 2023 was negative. Stable with no anginal symptoms. No indication for ischemic evaluation.  Continue aspirin 81 mg daily, continue Lipitor 40 mg daily.  Will send in nitroglycerin  to be taken as needed as needed for chest pain.  Chronic systolic heart failure-most recent echo in 2023 revealed EF 60 to 65%, grade 1 DD, moderate concentric LVH.  NYHA class I, euvolemic.   Continue Jardiance  10 mg daily, valsartan  80 mg daily.  Currently not on beta-blocker therapy, although it appears she was previously on metoprolol  back in 2013.  Hypertension-blood pressure is well-controlled today at 120/68, continue valsartan  80 mg daily. Preoperative evaluation -plans for conscious sedation for drain placement on 07/10/2022. According to the Revised Cardiac Risk Index (RCRI), her Perioperative Risk of Major Cardiac Event is (%): 6.6 Her Functional Capacity in METs is: 6.61 according to the [facility] Activity Status Index (DASI). Therefore, based on ACC/AHA guidelines, patient would be at acceptable risk for the planned procedure without further cardiovascular testing. I will route this recommendation to the requesting party via Epic fax function.    Disposition-keep follow-up appointment with Dr. Krasowski.  Consider beta-blocker therapy.       Medication Adjustments/Labs and Tests Ordered: Current medicines are reviewed at length with the patient today.  Concerns regarding medicines are outlined above.  No orders of the defined types were placed in this encounter.  Meds ordered this encounter  Medications   nitroGLYCERIN  (NITROSTAT ) 0.4 MG SL tablet    Sig: Place 1 tablet (0.4 mg total) under the tongue every 5 (five) minutes as needed for chest pain.    Dispense:  25 tablet    Refill:  1    Patient Instructions  Medication Instructions:  Your physician has recommended you make the following change in your medication:   START: Nitroglycerin  0.4 mg  under the tongue every 5 minutes as needed for chest pain  *If you need a refill on your cardiac medications before your next appointment, please call your pharmacy*   Lab Work: None If you have labs (blood work) drawn today and your tests are completely normal, you will receive your results only by: MyChart Message (if you have MyChart) OR A paper copy in the mail If you have any lab test that is abnormal or we need to  change your treatment, we will call you to review the results.   Testing/Procedures: None   Follow-Up: At [facility], you and your health needs are our priority.  As part of our continuing mission to provide you with exceptional heart care, we have created designated Provider Care Teams.  These Care Teams include your primary Cardiologist (physician) and Advanced Practice Providers (APPs -  Physician Assistants and Nurse Practitioners) who all work together to provide you with the care you need, when you need it.  We recommend signing up for the patient portal called MyChart.  Sign up information is provided on this After Visit Summary.  MyChart is used to connect with patients for Virtual Visits (Telemedicine).  Patients are able to view lab/test results, encounter notes, upcoming appointments, etc.  Non-urgent messages can be sent to your provider as well.   To learn more about what you can do with MyChart, go to [email or web address].    Your next appointment:   Keep upcoming appointment with Dr. Bernie  Provider:   Delon Hoover, NP [facility])    Other Instructions None    Signed, Delon JAYSON Hoover, NP  07/09/2022 2:09 PM    [facility] "

## 2022-07-09 ENCOUNTER — Ambulatory Visit: Payer: Medicare HMO | Attending: Cardiology | Admitting: Cardiology

## 2022-07-09 ENCOUNTER — Encounter: Payer: Self-pay | Admitting: Cardiology

## 2022-07-09 ENCOUNTER — Other Ambulatory Visit: Payer: Self-pay | Admitting: Nurse Practitioner

## 2022-07-09 VITALS — BP 120/68 | HR 90 | Ht 61.0 in | Wt 159.0 lb

## 2022-07-09 DIAGNOSIS — I5022 Chronic systolic (congestive) heart failure: Secondary | ICD-10-CM | POA: Diagnosis not present

## 2022-07-09 DIAGNOSIS — R072 Precordial pain: Secondary | ICD-10-CM | POA: Diagnosis not present

## 2022-07-09 DIAGNOSIS — Z01810 Encounter for preprocedural cardiovascular examination: Secondary | ICD-10-CM

## 2022-07-09 DIAGNOSIS — Z170 Estrogen receptor positive status [ER+]: Secondary | ICD-10-CM | POA: Diagnosis not present

## 2022-07-09 DIAGNOSIS — I2510 Atherosclerotic heart disease of native coronary artery without angina pectoris: Secondary | ICD-10-CM

## 2022-07-09 DIAGNOSIS — I10 Essential (primary) hypertension: Secondary | ICD-10-CM | POA: Diagnosis not present

## 2022-07-09 DIAGNOSIS — C50312 Malignant neoplasm of lower-inner quadrant of left female breast: Secondary | ICD-10-CM | POA: Diagnosis not present

## 2022-07-09 MED ORDER — NITROGLYCERIN 0.4 MG SL SUBL: 0.4000 mg | SUBLINGUAL_TABLET | SUBLINGUAL | 1 refills | Status: AC | PRN

## 2022-07-09 NOTE — Telephone Encounter (Signed)
Pt scheduled for appt by front desk for Thursday 07-09-22.

## 2022-07-09 NOTE — Anesthesia Preprocedure Evaluation (Addendum)
Anesthesia Evaluation  Patient identified by MRN, date of birth, ID band Patient awake    Reviewed: Allergy & Precautions, NPO status , Patient's Chart, lab work & pertinent test results  History of Anesthesia Complications Negative for: history of anesthetic complications  Airway Mallampati: II  TM Distance: >3 FB Neck ROM: Full    Dental  (+) Dental Advisory Given, Chipped   Pulmonary neg pulmonary ROS   breath sounds clear to auscultation       Cardiovascular hypertension, Pt. on medications (-) angina + CAD (CAD s/p LHC in 2012 revealing moderate stenosis of LAD)   Rhythm:Regular Rate:Normal  07/2021 stress:  The study is normal. The study is low risk. Left ventricular function is normal. Nuclear stress EF: 62 %. The left ventricular ejection fraction is normal (55-65%). End diastolic cavity size is normal.  06/2021 ECHO: EF 60-65%. The LV has normal function, no regional wall motion abnormalities. There is moderate concentric LVH. Grade I diastolic dysfunction, trivial MR, mild AI    Neuro/Psych    Depression    negative neurological ROS     GI/Hepatic Neg liver ROS,GERD  Medicated and Controlled,,  Endo/Other  diabetes (glu 107), Insulin Dependent  ozempic  Renal/GU negative Renal ROS     Musculoskeletal  (+) Arthritis ,    Abdominal   Peds  Hematology negative hematology ROS (+)   Anesthesia Other Findings Breast cancer  Reproductive/Obstetrics                             Anesthesia Physical Anesthesia Plan  ASA: 3  Anesthesia Plan: MAC   Post-op Pain Management: Tylenol PO (pre-op)* and Minimal or no pain anticipated   Induction:   PONV Risk Score and Plan: 2 and Ondansetron and Treatment may vary due to age or medical condition  Airway Management Planned: Simple Face Mask and Natural Airway  Additional Equipment: None  Intra-op Plan:   Post-operative Plan:    Informed Consent: I have reviewed the patients History and Physical, chart, labs and discussed the procedure including the risks, benefits and alternatives for the proposed anesthesia with the patient or authorized representative who has indicated his/her understanding and acceptance.     Dental advisory given  Plan Discussed with: CRNA and Surgeon  Anesthesia Plan Comments: (See PAT note 07/08/2022)       Anesthesia Quick Evaluation

## 2022-07-09 NOTE — Progress Notes (Addendum)
Anesthesia chart review   Case: [id number] Date/Time: 07/10/22 0815   Procedure: DRAIN PLACEMENT LEFT MASTECTOMY CAVITY (Left)   Anesthesia type: Monitor Anesthesia Care   Pre-op diagnosis: RECURRENT SEROMA LEFT CHEST WALL   Location: [facility] ROOM 04 / [facility]   Surgeons: Griselda Miner, MD       DISCUSSION: 77 year old never smoker with history of HTN, GERD, DM II, CAD, CHF, left breast cancer, recurrent seroma of left chest wall scheduled for above procedure 07/10/2022 with Dr. Chevis Pretty.   Pt presented to the ED 07/05/2022 for chest pain, low suspicion for ACS. Advised to follow up with cardio.  She has an appointment later today.   Per cardio note 07/09/2022, chest pain consistent with musculoskeletal pain, no indication for ischemic workup. "Plans for conscious sedation for drain placement on 07/10/2022. According to the Revised Cardiac Risk Index (RCRI), her Perioperative Risk of Major Cardiac Event is (%): 6.6 Her Functional Capacity in METs is: 6.61 according to the Duke Activity Status Index (DASI). Therefore, based on ACC/AHA guidelines, patient would be at acceptable risk for the planned procedure without further cardiovascular testing. I will route this recommendation to the requesting party via Epic fax function."  Anticipate pt can proceed with planned procedure barring acute status change.   VS: BP (!) 159/80   Pulse 74   Temp 37 C (Oral)   Resp 16   Ht  (1.549 m)   Wt 71.7 kg   LMP 10/17/1991 (Exact Date)   SpO2 100%   BMI 29.85 kg/m   PROVIDERS: Gerre Scull, NP is PCP   Cardiologist - Gypsy Balsam, MD  LABS: Labs reviewed: Acceptable for surgery. (all labs ordered are listed, but only abnormal results are displayed)  Labs Reviewed  GLUCOSE, CAPILLARY     IMAGES:   EKG:   CV: Myocardial perfusion 07/24/2021   The study is normal. The study is low risk.   Left ventricular function is normal. Nuclear stress EF: 62 %. The left ventricular ejection  fraction is normal (55-65%). End diastolic cavity size is normal.   Prior study not available for comparison.  Echo 07/14/2021 1. Left ventricular ejection fraction, by estimation, is 60 to 65%. The  left ventricle has normal function. The left ventricle has no regional  wall motion abnormalities. There is moderate concentric left ventricular  hypertrophy. Left ventricular  diastolic parameters are consistent with Grade I diastolic dysfunction  (impaired relaxation).   2. Right ventricular systolic function is normal. The right ventricular  size is normal. Tricuspid regurgitation signal is inadequate for assessing  PA pressure.   3. The mitral valve is abnormal. Trivial mitral valve regurgitation.  Moderate mitral annular calcification.   4. The aortic valve is tricuspid. There is mild calcification of the  aortic valve. There is mild thickening of the aortic valve. Aortic valve  regurgitation is mild. Aortic valve sclerosis/calcification is present,  without any evidence of aortic  stenosis.   5. The inferior vena cava is normal in size with greater than 50%  respiratory variability, suggesting right atrial pressure of 3 mmHg.   Past Medical History:  Diagnosis Date   Allergy    Anemia    during knee replacement 15 years ago   Blood transfusion without reported diagnosis    Breast cancer 1997   Left Breast Cancer   Cancer    Cervical spondylosis    CHF (congestive heart failure)    Coronary artery disease involving native coronary artery of  native heart without angina pectoris    COVID 04/2021   mild case   Diabetes mellitus without complication    Diverticulosis    GERD (gastroesophageal reflux disease)    without esophagitis   History of breast cancer    History of kidney stones    Hyperlipidemia    Hypertension    Lumbar spondylosis    Osteoarthritis    right knee   Osteopenia due to cancer therapy 09/30/2021   Other eczema    Personal history of radiation therapy  1997   Left Breast Cancer   Recurrent major depressive disorder    Thyroid goiter     Past Surgical History:  Procedure Laterality Date   APPENDECTOMY     BREAST BIOPSY Right 08/01/2021   BREAST LUMPECTOMY Left 1997   CESAREAN SECTION     CHOLECYSTECTOMY     HEMATOMA EVACUATION Left 03/12/2022   Procedure: PLACEMENT OF DRAIN LEFT CHEST WALL;  Surgeon: Griselda Miner, MD;  Location: [facility];  Service: General;  Laterality: Left;   KNEE ARTHROSCOPY Right    LESION REMOVAL Left 08/25/2021   Procedure: EXCISION SKIN LESION LEFT CHEST WALL;  Surgeon: Griselda Miner, MD;  Location: [facility];  Service: General;  Laterality: Left;   REDUCTION MAMMAPLASTY Right 1998   SIMPLE MASTECTOMY WITH AXILLARY SENTINEL NODE BIOPSY Left 08/25/2021   Procedure: LEFT MASTECTOMY;  Surgeon: Griselda Miner, MD;  Location: [facility];  Service: General;  Laterality: Left;   TOTAL HIP ARTHROPLASTY Right    TUBAL LIGATION      MEDICATIONS:  ACCU-CHEK SOFTCLIX LANCETS lancets   amoxicillin-clavulanate (AUGMENTIN) 875-125 MG tablet   aspirin EC 81 MG tablet   atorvastatin (LIPITOR) 40 MG tablet   BD PEN NEEDLE NANO 2ND GEN 32G X 4 MM MISC   Blood Glucose Monitoring Suppl (GLUCOCOM BLOOD GLUCOSE MONITOR) DEVI   Carboxymethylcell-Hypromellose (GENTEAL OP)   cetirizine (ZYRTEC) 10 MG tablet   Continuous Blood Gluc Receiver (FREESTYLE LIBRE 3 READER) DEVI   denosumab (PROLIA) 60 MG/ML SOSY injection   empagliflozin (JARDIANCE) 10 MG TABS tablet   esomeprazole (NEXIUM) 40 MG capsule   fluconazole (DIFLUCAN) 150 MG tablet   fluticasone (FLONASE) 50 MCG/ACT nasal spray   folic acid (FOLVITE) 1 MG tablet   ibuprofen (ADVIL) 200 MG tablet   insulin glargine (LANTUS SOLOSTAR) 100 UNIT/ML Solostar Pen   Lancet Devices (ONE TOUCH DELICA LANCING DEV) MISC   letrozole (FEMARA) 2.5 MG tablet   LINZESS 145 MCG CAPS capsule   OZEMPIC, 2 MG/DOSE, 8 MG/3ML SOPN   traZODone (DESYREL) 50 MG tablet   valsartan (DIOVAN) 80 MG  tablet   Vitamin D, Ergocalciferol, (DRISDOL) 1.25 MG (50000 UNIT) CAPS capsule   No current facility-administered medications for this encounter.    Jodell Cipro Ward, PA-C [facility] [phone]

## 2022-07-09 NOTE — Patient Instructions (Signed)
Medication Instructions:  Your physician has recommended you make the following change in your medication:   START: Nitroglycerin 0.4 mg under the tongue every 5 minutes as needed for chest pain  *If you need a refill on your cardiac medications before your next appointment, please call your pharmacy*   Lab Work: None If you have labs (blood work) drawn today and your tests are completely normal, you will receive your results only by: MyChart Message (if you have MyChart) OR A paper copy in the mail If you have any lab test that is abnormal or we need to change your treatment, we will call you to review the results.   Testing/Procedures: None   Follow-Up: At [facility], you and your health needs are our priority.  As part of our continuing mission to provide you with exceptional heart care, we have created designated Provider Care Teams.  These Care Teams include your primary Cardiologist (physician) and Advanced Practice Providers (APPs -  Physician Assistants and Nurse Practitioners) who all work together to provide you with the care you need, when you need it.  We recommend signing up for the patient portal called "MyChart".  Sign up information is provided on this After Visit Summary.  MyChart is used to connect with patients for Virtual Visits (Telemedicine).  Patients are able to view lab/test results, encounter notes, upcoming appointments, etc.  Non-urgent messages can be sent to your provider as well.   To learn more about what you can do with MyChart, go to [email or web address].    Your next appointment:   Keep upcoming appointment with Dr. Bing Matter  Provider:   Wallis Bamberg, NP [facility])    Other Instructions None

## 2022-07-10 ENCOUNTER — Encounter (HOSPITAL_COMMUNITY): Payer: Self-pay | Admitting: General Surgery

## 2022-07-10 ENCOUNTER — Ambulatory Visit (HOSPITAL_COMMUNITY): Payer: Medicare HMO | Admitting: Physician Assistant

## 2022-07-10 ENCOUNTER — Ambulatory Visit (HOSPITAL_COMMUNITY)
Admission: RE | Admit: 2022-07-10 | Discharge: 2022-07-10 | Disposition: A | Discharge status: Home / Self Care | Payer: Medicare HMO | Attending: General Surgery | Admitting: General Surgery

## 2022-07-10 ENCOUNTER — Other Ambulatory Visit: Payer: Self-pay

## 2022-07-10 ENCOUNTER — Encounter (HOSPITAL_COMMUNITY)
Admission: RE | Disposition: A | Discharge status: Home / Self Care | Payer: Self-pay | Source: Home / Self Care | Attending: General Surgery

## 2022-07-10 ENCOUNTER — Ambulatory Visit (HOSPITAL_BASED_OUTPATIENT_CLINIC_OR_DEPARTMENT_OTHER): Payer: Medicare HMO | Admitting: Anesthesiology

## 2022-07-10 DIAGNOSIS — I10 Essential (primary) hypertension: Secondary | ICD-10-CM | POA: Diagnosis not present

## 2022-07-10 DIAGNOSIS — I2510 Atherosclerotic heart disease of native coronary artery without angina pectoris: Secondary | ICD-10-CM | POA: Diagnosis not present

## 2022-07-10 DIAGNOSIS — M96843 Postprocedural seroma of a musculoskeletal structure following other procedure: Secondary | ICD-10-CM | POA: Insufficient documentation

## 2022-07-10 DIAGNOSIS — Z833 Family history of diabetes mellitus: Secondary | ICD-10-CM | POA: Diagnosis not present

## 2022-07-10 DIAGNOSIS — Z170 Estrogen receptor positive status [ER+]: Secondary | ICD-10-CM | POA: Insufficient documentation

## 2022-07-10 DIAGNOSIS — K219 Gastro-esophageal reflux disease without esophagitis: Secondary | ICD-10-CM | POA: Diagnosis not present

## 2022-07-10 DIAGNOSIS — Z9012 Acquired absence of left breast and nipple: Secondary | ICD-10-CM | POA: Diagnosis not present

## 2022-07-10 DIAGNOSIS — Z7985 Long-term (current) use of injectable non-insulin antidiabetic drugs: Secondary | ICD-10-CM

## 2022-07-10 DIAGNOSIS — E119 Type 2 diabetes mellitus without complications: Secondary | ICD-10-CM | POA: Diagnosis not present

## 2022-07-10 DIAGNOSIS — I509 Heart failure, unspecified: Secondary | ICD-10-CM | POA: Insufficient documentation

## 2022-07-10 DIAGNOSIS — Z923 Personal history of irradiation: Secondary | ICD-10-CM | POA: Insufficient documentation

## 2022-07-10 DIAGNOSIS — Z794 Long term (current) use of insulin: Secondary | ICD-10-CM | POA: Insufficient documentation

## 2022-07-10 DIAGNOSIS — C50312 Malignant neoplasm of lower-inner quadrant of left female breast: Secondary | ICD-10-CM | POA: Diagnosis not present

## 2022-07-10 DIAGNOSIS — Z8249 Family history of ischemic heart disease and other diseases of the circulatory system: Secondary | ICD-10-CM | POA: Diagnosis not present

## 2022-07-10 DIAGNOSIS — L7634 Postprocedural seroma of skin and subcutaneous tissue following other procedure: Secondary | ICD-10-CM | POA: Diagnosis not present

## 2022-07-10 DIAGNOSIS — Z7984 Long term (current) use of oral hypoglycemic drugs: Secondary | ICD-10-CM | POA: Diagnosis not present

## 2022-07-10 DIAGNOSIS — M1990 Unspecified osteoarthritis, unspecified site: Secondary | ICD-10-CM | POA: Diagnosis not present

## 2022-07-10 DIAGNOSIS — Z8261 Family history of arthritis: Secondary | ICD-10-CM | POA: Diagnosis not present

## 2022-07-10 DIAGNOSIS — F32A Depression, unspecified: Secondary | ICD-10-CM | POA: Insufficient documentation

## 2022-07-10 DIAGNOSIS — Z79899 Other long term (current) drug therapy: Secondary | ICD-10-CM | POA: Insufficient documentation

## 2022-07-10 DIAGNOSIS — I110 Hypertensive heart disease with heart failure: Secondary | ICD-10-CM | POA: Insufficient documentation

## 2022-07-10 DIAGNOSIS — E118 Type 2 diabetes mellitus with unspecified complications: Secondary | ICD-10-CM

## 2022-07-10 HISTORY — PX: EVACUATION BREAST HEMATOMA: SHX1537

## 2022-07-10 LAB — GLUCOSE, CAPILLARY
Glucose-Capillary: 107 mg/dL — ABNORMAL HIGH (ref 70–99)
Glucose-Capillary: 111 mg/dL — ABNORMAL HIGH (ref 70–99)

## 2022-07-10 SURGERY — EVACUATION, HEMATOMA, BREAST
Anesthesia: Monitor Anesthesia Care | Laterality: Left

## 2022-07-10 MED ORDER — DEXMEDETOMIDINE HCL IN NACL 80 MCG/20ML IV SOLN
INTRAVENOUS | Status: DC | PRN
  Administered 2022-07-10: 4 ug via BUCCAL

## 2022-07-10 MED ORDER — HEMOSTATIC AGENTS (NO CHARGE) OPTIME
TOPICAL | Status: DC | PRN
  Administered 2022-07-10: 1 via TOPICAL

## 2022-07-10 MED ORDER — MIDAZOLAM HCL 2 MG/2ML IJ SOLN: 0.5000 mg | Freq: Once | INTRAMUSCULAR | Status: DC | PRN

## 2022-07-10 MED ORDER — OXYCODONE HCL 5 MG/5ML PO SOLN: 5.0000 mg | Freq: Once | ORAL | Status: DC | PRN

## 2022-07-10 MED ORDER — FENTANYL CITRATE (PF) 100 MCG/2ML IJ SOLN
INTRAMUSCULAR | Status: AC
  Filled 2022-07-10: qty 2

## 2022-07-10 MED ORDER — OXYCODONE HCL 5 MG PO TABS: 5.0000 mg | ORAL_TABLET | Freq: Once | ORAL | Status: DC | PRN

## 2022-07-10 MED ORDER — PROPOFOL 500 MG/50ML IV EMUL
INTRAVENOUS | Status: DC | PRN
  Administered 2022-07-10: 75 ug/kg/min via INTRAVENOUS
  Administered 2022-07-10: 40 mg via INTRAVENOUS

## 2022-07-10 MED ORDER — MIDAZOLAM HCL 2 MG/2ML IJ SOLN
INTRAMUSCULAR | Status: AC
  Filled 2022-07-10: qty 2

## 2022-07-10 MED ORDER — LIDOCAINE HCL (PF) 1 % IJ SOLN
INTRAMUSCULAR | Status: DC | PRN
  Administered 2022-07-10: 10 mL

## 2022-07-10 MED ORDER — FENTANYL CITRATE (PF) 100 MCG/2ML IJ SOLN
INTRAMUSCULAR | Status: DC | PRN
  Administered 2022-07-10: 50 ug via INTRAVENOUS

## 2022-07-10 MED ORDER — CEFAZOLIN SODIUM-DEXTROSE 2-4 GM/100ML-% IV SOLN
2.0000 g | INTRAVENOUS | Status: AC
  Administered 2022-07-10: 2 g via INTRAVENOUS
  Filled 2022-07-10: qty 100

## 2022-07-10 MED ORDER — CHLORHEXIDINE GLUCONATE CLOTH 2 % EX PADS: 6.0000 | MEDICATED_PAD | Freq: Once | CUTANEOUS | Status: DC

## 2022-07-10 MED ORDER — ACETAMINOPHEN 500 MG PO TABS
1000.0000 mg | ORAL_TABLET | ORAL | Status: AC
  Administered 2022-07-10: 1000 mg via ORAL
  Filled 2022-07-10: qty 2

## 2022-07-10 MED ORDER — GABAPENTIN 300 MG PO CAPS
300.0000 mg | ORAL_CAPSULE | ORAL | Status: AC
  Administered 2022-07-10: 300 mg via ORAL
  Filled 2022-07-10: qty 1

## 2022-07-10 MED ORDER — PROMETHAZINE HCL 25 MG/ML IJ SOLN: 6.2500 mg | INTRAMUSCULAR | Status: DC | PRN

## 2022-07-10 MED ORDER — LIDOCAINE HCL (PF) 1 % IJ SOLN
INTRAMUSCULAR | Status: AC
  Filled 2022-07-10: qty 30

## 2022-07-10 MED ORDER — LACTATED RINGERS IV SOLN: INTRAVENOUS | Status: DC

## 2022-07-10 MED ORDER — BUPIVACAINE HCL 0.25 % IJ SOLN
INTRAMUSCULAR | Status: AC
  Filled 2022-07-10: qty 1

## 2022-07-10 MED ORDER — MEPERIDINE HCL 50 MG/ML IJ SOLN: 6.2500 mg | INTRAMUSCULAR | Status: DC | PRN

## 2022-07-10 MED ORDER — CHLORHEXIDINE GLUCONATE 0.12 % MT SOLN
15.0000 mL | Freq: Once | OROMUCOSAL | Status: AC
  Administered 2022-07-10: 15 mL via OROMUCOSAL

## 2022-07-10 MED ORDER — MIDAZOLAM HCL 5 MG/5ML IJ SOLN
INTRAMUSCULAR | Status: DC | PRN
  Administered 2022-07-10: 1 mg via INTRAVENOUS

## 2022-07-10 MED ORDER — FENTANYL CITRATE PF 50 MCG/ML IJ SOSY: 25.0000 ug | PREFILLED_SYRINGE | INTRAMUSCULAR | Status: DC | PRN

## 2022-07-10 MED ORDER — OXYCODONE HCL 5 MG PO TABS
5.0000 mg | ORAL_TABLET | Freq: Four times a day (QID) | ORAL | 0 refills | Status: DC | PRN
Start: 2022-07-10 — End: 2023-01-06

## 2022-07-10 MED ORDER — ORAL CARE MOUTH RINSE: 15.0000 mL | Freq: Once | OROMUCOSAL | Status: AC

## 2022-07-10 MED ORDER — ONDANSETRON HCL 4 MG/2ML IJ SOLN
INTRAMUSCULAR | Status: DC | PRN
  Administered 2022-07-10: 4 mg via INTRAVENOUS

## 2022-07-10 SURGICAL SUPPLY — 40 items
ADH SKN CLS APL DERMABOND .7 (GAUZE/BANDAGES/DRESSINGS) ×1
APL SKNCLS STERI-STRIP NONHPOA (GAUZE/BANDAGES/DRESSINGS)
APL SRG 38 LTWT LNG FL B (MISCELLANEOUS) ×1
APPLICATOR ARISTA FLEXITIP XL (MISCELLANEOUS) IMPLANT
BAG COUNTER SPONGE SURGICOUNT (BAG) IMPLANT
BAG SPNG CNTER NS LX DISP (BAG)
BENZOIN TINCTURE PRP APPL 2/3 (GAUZE/BANDAGES/DRESSINGS) IMPLANT
BLADE HEX COATED 2.75 (ELECTRODE) ×1 IMPLANT
BLADE SURG SZ10 CARB STEEL (BLADE) ×2 IMPLANT
DERMABOND ADVANCED .7 DNX12 (GAUZE/BANDAGES/DRESSINGS) IMPLANT
DRAPE LAPAROTOMY T 102X78X121 (DRAPES) IMPLANT
DRAPE LAPAROTOMY TRNSV 102X78 (DRAPES) IMPLANT
DRAPE SHEET LG 3/4 BI-LAMINATE (DRAPES) IMPLANT
ELECT REM PT RETURN 15FT ADLT (MISCELLANEOUS) ×1 IMPLANT
EVACUATOR SILICONE 100CC (DRAIN) IMPLANT
GAUZE SPONGE 4X4 12PLY STRL (GAUZE/BANDAGES/DRESSINGS) ×1 IMPLANT
GLOVE BIO SURGEON STRL SZ7.5 (GLOVE) ×2 IMPLANT
GLOVE BIOGEL PI IND STRL 7.0 (GLOVE) ×1 IMPLANT
GOWN STRL REUS W/ TWL LRG LVL3 (GOWN DISPOSABLE) ×1 IMPLANT
GOWN STRL REUS W/ TWL XL LVL3 (GOWN DISPOSABLE) ×1 IMPLANT
GOWN STRL REUS W/TWL LRG LVL3 (GOWN DISPOSABLE) ×1
GOWN STRL REUS W/TWL XL LVL3 (GOWN DISPOSABLE) ×1
HEMOSTAT ARISTA ABSORB 3G PWDR (HEMOSTASIS) IMPLANT
KIT BASIN OR (CUSTOM PROCEDURE TRAY) ×1 IMPLANT
KIT TURNOVER KIT A (KITS) IMPLANT
MARKER SKIN DUAL TIP RULER LAB (MISCELLANEOUS) IMPLANT
NDL HYPO 25X1 1.5 SAFETY (NEEDLE) ×1 IMPLANT
NEEDLE HYPO 25X1 1.5 SAFETY (NEEDLE) ×1 IMPLANT
NS IRRIG 1000ML POUR BTL (IV SOLUTION) ×1 IMPLANT
PACK BASIC VI WITH GOWN DISP (CUSTOM PROCEDURE TRAY) ×1 IMPLANT
PENCIL SMOKE EVACUATOR (MISCELLANEOUS) IMPLANT
SOL PREP POV-IOD 4OZ 10% (MISCELLANEOUS) ×1 IMPLANT
SPIKE FLUID TRANSFER (MISCELLANEOUS) IMPLANT
SPONGE T-LAP 4X18 ~~LOC~~+RFID (SPONGE) ×1 IMPLANT
STAPLER VISISTAT 35W (STAPLE) IMPLANT
SUT MNCRL AB 4-0 PS2 18 (SUTURE) IMPLANT
SUT VIC AB 3-0 SH 18 (SUTURE) IMPLANT
SYR CONTROL 10ML LL (SYRINGE) ×1 IMPLANT
TOWEL OR 17X26 10 PK STRL BLUE (TOWEL DISPOSABLE) ×1 IMPLANT
WATER STERILE IRR 1000ML POUR (IV SOLUTION) ×1 IMPLANT

## 2022-07-10 NOTE — Transfer of Care (Signed)
Immediate Anesthesia Transfer of Care Note  Patient: Bonnie Reed  Procedure(s) Performed: DRAIN PLACEMENT LEFT MASTECTOMY CAVITY (Left)  Patient Location: PACU  Anesthesia Type:MAC  Level of Consciousness: awake and patient cooperative  Airway & Oxygen Therapy: Patient Spontanous Breathing and Patient connected to face mask  Post-op Assessment: Report given to RN and Post -op Vital signs reviewed and stable  Post vital signs: Reviewed and stable  Last Vitals:  Vitals Value Taken Time  BP 143/67 07/10/22 0900  Temp    Pulse 65 07/10/22 0901  Resp 16 07/10/22 0901  SpO2 100 % 07/10/22 0901  Vitals shown include unvalidated device data.  Last Pain:  Vitals:   07/10/22 0701  TempSrc: Oral         Complications: No notable events documented.

## 2022-07-10 NOTE — Interval H&P Note (Signed)
History and Physical Interval Note:  07/10/2022 8:08 AM  Bonnie Reed  has presented today for surgery, with the diagnosis of RECURRENT SEROMA LEFT CHEST WALL.  The various methods of treatment have been discussed with the patient and family. After consideration of risks, benefits and other options for treatment, the patient has consented to  Procedure(s): DRAIN PLACEMENT LEFT MASTECTOMY CAVITY (Left) as a surgical intervention.  The patient's history has been reviewed, patient examined, no change in status, stable for surgery.  I have reviewed the patient's chart and labs.  Questions were answered to the patient's satisfaction.     [name]

## 2022-07-10 NOTE — H&P (Signed)
PROVIDER: Lindell Noe, MD  MRN: [id number] DOB: November 11, 1945 Subjective   Chief Complaint: Follow-up ( RE-CHECK - Seroma Re-check/)   History of Present Illness: Bonnie Reed is a 77 y.o. female who is seen today for left breast cancer. The patient is a 77 year old black female who is about 11 months status post left mastectomy for a T1 CNX left breast cancer that was ER and PR positive and HER2 negative with a Ki-67 of 15%. She tolerated the surgery well. She elected for mastectomy because of her previous history of breast cancer and radiation on that side. She is also about 4 months status post placement of a drain and a recurrent seroma along the left chest wall. She tolerated this well. She has had a recurrence of the swelling along the left chest wall.    Review of Systems: A complete review of systems was obtained from the patient. I have reviewed this information and discussed as appropriate with the patient. See HPI as well for other ROS.  ROS   Medical History: Past Medical History:  Diagnosis Date  Abnormal nuclear stress test 10/25  EAV:WUJW ischemia, mid and distal LAD, normal ef some mild distal anteror hypo  Anemia  Breast cancer (CMS/HHS-HCC)  sp lumpectomy and radiation  Depression  Diabetes mellitus type 2, uncomplicated (CMS/HHS-HCC)  Eczema, unspecified  Encounter for blood transfusion  Gastroesophageal reflux  Goiter  neck  Hx of cardiac cath  11/12:DRH:Mild moderate mid distal LAD small vessel, on the small end for a stent.  Hypertension  Kidney stones  Osteoarthritis  Thyroid goiter   Patient Active Problem List  Diagnosis  Osteoarthritis  Hypertension  Diabetes mellitus type 2, uncomplicated (CMS/HHS-HCC)  Depression  Encounter for blood transfusion  Kidney stones  Anemia  Eczema, unspecified  Thyroid goiter  Gastroesophageal reflux  Abnormal nuclear stress test  CAD (coronary artery disease)  Breast cancer (CMS/HHS-HCC)  Cervicalgia   Lumbago  Cervical spondylosis without myelopathy  Lumbosacral spondylosis without myelopathy  Hx of cardiac cath  Malignant neoplasm of lower-inner quadrant of left breast in female, estrogen receptor positive (CMS/HHS-HCC)  Enlarged thyroid   Past Surgical History:  Procedure Laterality Date  APPENDECTOMY 03/23/1954  CHOLECYSTECTOMY 03/23/1970  Cyst removed (R) wrist 03/24/2003  Pseudomonas (R) foot 03/23/2005  BREAST LUMPECTOMY  CESAREAN SECTION  MASTECTOMY  right total hip replacement    Allergies  Allergen Reactions  Hay Fever & Allergy Relief [Chlorpheniramine-Phenylpropan] Itching  Chlorpheniramine Itching   Current Outpatient Medications on File Prior to Visit  Medication Sig Dispense Refill  cetirizine (ZYRTEC) 10 MG tablet Take 10 mg by mouth daily.  cholecalciferol, vitamin D3, (VITAMIN D3) 125 mcg (5,000 unit) tablet Take 1 tablet by mouth daily.  ciprofloxacin (CIPRO) 500 MG tablet  denosumab (PROLIA) 60 mg/mL inj syringe Inject subcutaneously  esomeprazole magnesium (NEXIUM) 10 mg packet Take 10 mg by mouth continuously as needed.  FLUoxetine (PROZAC) 20 MG tablet Take 20 mg by mouth daily.  fluticasone propionate (FLONASE) 50 mcg/actuation nasal spray Place 2 sprays into both nostrils once daily  folic acid (FOLVITE) 1 MG tablet Take by mouth  glipiZIDE (GLUCOTROL) 10 MG tablet Take 10 mg by mouth 2 (two) times daily  hydroCHLOROthiazide (HYDRODIURIL) 25 MG tablet Take 25 mg by mouth once daily  HYDROcodone-acetaminophen (VICODIN) 5-500 mg tablet  insulin ASPART (NOVOLOG FLEXPEN) pen injector (concentration 100 units/mL) Inject subcutaneously  insulin DETEMIR (LEVEMIR FLEXPEN) pen injector (concentration 100 units/mL) Inject subcutaneously  JARDIANCE 10 mg tablet Take 10 mg by mouth  every morning  letrozole (FEMARA) 2.5 mg tablet Take by mouth  metFORMIN (GLUCOPHAGE) 1000 MG tablet Take 1,000 mg by mouth 2 (two) times daily with meals.  oxyCODONE (ROXICODONE)  5 MG immediate release tablet Take 1 tablet (5 mg total) by mouth every 4 (four) hours as needed for Pain 10 tablet 0  OZEMPIC 0.25 mg or 0.5 mg (2 mg/3 mL) pen injector INJECT 0.25MG  INTO THE SKIN ONCE A WEEK. START WITH 0.25MG  ONCE A WEEK FOR 4 WEEKS, THEN INCREASE TO 0.5MG  WEEKLY  valsartan (DIOVAN) 80 MG tablet Take 80 mg by mouth daily.  zolpidem (AMBIEN) 10 mg tablet Take 10 mg by mouth nightly as needed.   Current Facility-Administered Medications on File Prior to Visit  Medication Dose Route Frequency Provider Last Rate Last Admin  regadenoson (LEXISCAN) inj syringe 0.4 mg 0.4 mg Intravenous Once Alonna Buckler, MD   Family History  Problem Relation Age of Onset  Diabetes Mother 5  alive  Alzheimer's disease Mother  Stroke Mother  High blood pressure (Hypertension) Mother  Lung cancer Father 24  Deceased  Diabetes Sister  Depression Sister  Arthritis Sister  Asthma Sister  High blood pressure (Hypertension) Sister  Osteoporosis (Thinning of bones) Sister  Hyperthyroidism Child  Heart disease Other  Kidney disease Other  Osteoporosis (Thinning of bones) Other  Stroke Other  Arthritis Other  Cervical cancer Other  Diabetes Other  Myocardial Infarction (Heart attack) Other    Social History   Tobacco Use  Smoking Status Never  Smokeless Tobacco Not on file    Social History   Socioeconomic History  Marital status: Divorced  Tobacco Use  Smoking status: Never  Substance and Sexual Activity  Alcohol use: Yes  Comment: Occasional  Drug use: No   Objective:   Vitals:  PainSc: 7   There is no height or weight on file to calculate BMI.  Physical Exam Vitals reviewed.  Constitutional:  General: She is not in acute distress. Appearance: Normal appearance.  HENT:  Head: Normocephalic and atraumatic.  Right Ear: External ear normal.  Left Ear: External ear normal.  Nose: Nose normal.  Mouth/Throat:  Mouth: Mucous membranes are moist.   Pharynx: Oropharynx is clear.  Eyes:  General: No scleral icterus. Extraocular Movements: Extraocular movements intact.  Conjunctiva/sclera: Conjunctivae normal.  Pupils: Pupils are equal, round, and reactive to light.  Cardiovascular:  Rate and Rhythm: Normal rate and regular rhythm.  Pulses: Normal pulses.  Heart sounds: Normal heart sounds.  Pulmonary:  Effort: Pulmonary effort is normal. No respiratory distress.  Breath sounds: Normal breath sounds.  Abdominal:  General: Bowel sounds are normal.  Palpations: Abdomen is soft.  Tenderness: There is no abdominal tenderness.  Musculoskeletal:  General: No swelling, tenderness or deformity. Normal range of motion.  Cervical back: Normal range of motion and neck supple.  Skin: General: Skin is warm and dry.  Coloration: Skin is not jaundiced.  Neurological:  General: No focal deficit present.  Mental Status: She is alert and oriented to person, place, and time.  Psychiatric:  Mood and Affect: Mood normal.  Behavior: Behavior normal.     Breast: The left mastectomy incision is healing nicely with no sign of infection or seroma. The skin flaps are healthy. There is a small residual seroma under the skin flap today. The left lateral chest wall was prepped with ChloraPrep and infiltrated with 1% lidocaine. I was able to aspirate approximately 150 cc of old bloody fluid from the mastectomy site. She tolerated  this well.  Labs, Imaging and Diagnostic Testing:  Assessment and Plan:   Diagnoses and all orders for this visit:  Malignant neoplasm of lower-inner quadrant of left breast in female, estrogen receptor positive (CMS/HHS-HCC) - [facility] Case Posting Request; Future    The patient is about 11 months status post left mastectomy for breast cancer. She tolerated the surgery well. She is about 4 months status post placement of a drain for a recurrent seroma. She has a recurrence of the fluid along the left chest wall. At this point  I would recommend trying to replace the drain again but leave it in for longer. We can also cover the mastectomy cavity with a hemostatic powder to help at all stick together. I have discussed with her in detail the risk and benefits of the operation as well as some of the technical aspects and she understands and wishes to proceed. She does have some congestive heart failure and has been feeling a little bit short of breath lately. I have encouraged her to contact her cardiologist to discuss this with them. Since she will likely just get some conscious sedation she should be able to have the surgery in the next week or 2

## 2022-07-10 NOTE — Anesthesia Postprocedure Evaluation (Signed)
Anesthesia Post Note  Patient: Bonnie Reed  Procedure(s) Performed: DRAIN PLACEMENT LEFT MASTECTOMY CAVITY (Left)     Patient location during evaluation: PACU Anesthesia Type: MAC Level of consciousness: awake and alert, patient cooperative and oriented Pain management: pain level controlled Vital Signs Assessment: post-procedure vital signs reviewed and stable Respiratory status: spontaneous breathing, nonlabored ventilation and respiratory function stable Cardiovascular status: stable and blood pressure returned to baseline Postop Assessment: no apparent nausea or vomiting Anesthetic complications: no   No notable events documented.  Last Vitals:  Vitals:   07/10/22 0938 07/10/22 1000  BP: (!) 173/67   Pulse: (!) 57 (!) 59  Resp: 18   Temp: (!) 36.3 C   SpO2: 99% 99%    Last Pain:  Vitals:   07/10/22 1000  TempSrc:   PainSc: 0-No pain                 Zea Kostka,E. Shaquina Gillham

## 2022-07-10 NOTE — Op Note (Signed)
07/10/2022  8:52 AM  PATIENT:  Bonnie Reed  76 y.o. female  PRE-OPERATIVE DIAGNOSIS:  RECURRENT SEROMA LEFT CHEST WALL  POST-OPERATIVE DIAGNOSIS:  RECURRENT SEROMA LEFT CHEST WALL  PROCEDURE:  Procedure(s): DRAIN PLACEMENT LEFT MASTECTOMY CAVITY (Left)  SURGEON:  Surgeon(s) and Role:    * Griselda Miner, MD - Primary  PHYSICIAN ASSISTANT:   ASSISTANTS: none   ANESTHESIA:   local and IV sedation  EBL:  minimal   BLOOD ADMINISTERED:none  DRAINS: (1) Blake drain(s) in the left mastectomy cavity    LOCAL MEDICATIONS USED:  LIDOCAINE   SPECIMEN:  No Specimen  DISPOSITION OF SPECIMEN:  N/A  COUNTS:  YES  TOURNIQUET:  * No tourniquets in log *  DICTATION: .Dragon Dictation  After informed consent was obtained the patient was brought to the operating room and placed in the supine position on the operating table.  After adequate IV sedation had been given the patient's left chest wall was prepped with ChloraPrep, allowed to dry, and draped in usual sterile manner.  An appropriate timeout was performed.  The lateral left chest wall was infiltrated with 1% lidocaine.  A small transversely oriented incision was made at the lateral edge of the seroma cavity.  The incision was probed bluntly with a hemostat until access was gained to the seroma cavity.  The seroma cavity was completely evacuated.  The seroma cavity was then coated with Arista powder.  A 19 French round Blake drain was then placed into the seroma cavity and the drain was anchored to the skin with a 3-0 nylon stitch.  The drain was placed to bulb suction and there was a good seal.  The seroma cavity appeared to be completely evacuated.  Sterile drain dressings were applied.  The patient tolerated the procedure well.  At the end of the case all needle sponge and instrument counts were correct.  The patient was then awakened and taken to recovery in stable condition.  PLAN OF CARE: Discharge to home after PACU  PATIENT  DISPOSITION:  PACU - hemodynamically stable.   Delay start of Pharmacological VTE agent (>24hrs) due to surgical blood loss or risk of bleeding: not applicable

## 2022-07-11 ENCOUNTER — Encounter (HOSPITAL_COMMUNITY): Payer: Self-pay | Admitting: General Surgery

## 2022-07-13 ENCOUNTER — Other Ambulatory Visit: Payer: Self-pay | Admitting: Nurse Practitioner

## 2022-07-13 LAB — CYTOLOGY - PAP
Comment: NEGATIVE
Diagnosis: NEGATIVE
High risk HPV: NEGATIVE

## 2022-07-14 ENCOUNTER — Telehealth: Payer: Self-pay | Admitting: Nurse Practitioner

## 2022-07-14 MED ORDER — VALSARTAN 80 MG PO TABS: 80.0000 mg | ORAL_TABLET | Freq: Every day | ORAL | 1 refills | Status: DC

## 2022-07-14 NOTE — Telephone Encounter (Signed)
[name] [phone]   Please refill pts Valsartan.  She has 0 left [facility] on [address]

## 2022-07-15 NOTE — Telephone Encounter (Signed)
Left message that Rx was approved and sent into pharmacy

## 2022-07-16 DIAGNOSIS — R102 Pelvic and perineal pain: Secondary | ICD-10-CM | POA: Diagnosis not present

## 2022-07-16 DIAGNOSIS — D259 Leiomyoma of uterus, unspecified: Secondary | ICD-10-CM | POA: Diagnosis not present

## 2022-07-29 DIAGNOSIS — Z170 Estrogen receptor positive status [ER+]: Secondary | ICD-10-CM | POA: Diagnosis not present

## 2022-07-29 DIAGNOSIS — C50312 Malignant neoplasm of lower-inner quadrant of left female breast: Secondary | ICD-10-CM | POA: Diagnosis not present

## 2022-08-03 DIAGNOSIS — M25551 Pain in right hip: Secondary | ICD-10-CM | POA: Diagnosis not present

## 2022-08-03 DIAGNOSIS — Z96641 Presence of right artificial hip joint: Secondary | ICD-10-CM | POA: Diagnosis not present

## 2022-08-05 ENCOUNTER — Ambulatory Visit: Payer: Medicare HMO | Admitting: Cardiology

## 2022-08-07 DIAGNOSIS — L989 Disorder of the skin and subcutaneous tissue, unspecified: Secondary | ICD-10-CM | POA: Diagnosis not present

## 2022-08-07 DIAGNOSIS — L900 Lichen sclerosus et atrophicus: Secondary | ICD-10-CM | POA: Diagnosis not present

## 2022-08-07 DIAGNOSIS — M25551 Pain in right hip: Secondary | ICD-10-CM | POA: Diagnosis not present

## 2022-08-10 ENCOUNTER — Other Ambulatory Visit: Payer: Self-pay | Admitting: Nurse Practitioner

## 2022-08-11 NOTE — Telephone Encounter (Signed)
Requesting: Linzess 145 MCG Oral Capsule  Last Visit: 06/09/2022 Next Visit: Visit date not found Last Refill: 07/13/2022  Please Advise

## 2022-08-24 ENCOUNTER — Ambulatory Visit (INDEPENDENT_AMBULATORY_CARE_PROVIDER_SITE_OTHER): Payer: Medicare HMO | Admitting: Internal Medicine

## 2022-08-24 ENCOUNTER — Encounter: Payer: Self-pay | Admitting: Internal Medicine

## 2022-08-24 VITALS — BP 122/60 | HR 85 | Temp 98.2°F | Ht 61.0 in | Wt 152.2 lb

## 2022-08-24 DIAGNOSIS — S46912A Strain of unspecified muscle, fascia and tendon at shoulder and upper arm level, left arm, initial encounter: Secondary | ICD-10-CM | POA: Diagnosis not present

## 2022-08-24 MED ORDER — KETOROLAC TROMETHAMINE 60 MG/2ML IM SOLN
60.0000 mg | Freq: Once | INTRAMUSCULAR | Status: AC
Start: 2022-08-24 — End: 2022-08-24
  Administered 2022-08-24: 60 mg via INTRAMUSCULAR

## 2022-08-24 MED ORDER — METHOCARBAMOL 500 MG PO TABS
500.0000 mg | ORAL_TABLET | Freq: Three times a day (TID) | ORAL | 0 refills | Status: AC | PRN
Start: 2022-08-24 — End: 2022-08-31

## 2022-08-24 NOTE — Patient Instructions (Addendum)
Ibuprofen 600mg  (3 tablets) every 8 hours as needed for shoulder pain  Alternate Ice and heat  Rest , avoid heavy lifting.  Take muscle relaxant ( Methocarbamol) as directed for muscle spasms or tightness

## 2022-08-24 NOTE — Progress Notes (Signed)
[facility] [address] [place] [address] Dept: [phone] Dept Fax: [phone]  Acute Care Office Visit  Subjective:    Patient ID: Bonnie Reed, female    DOB: Nov 14, 1945, 77 y.o..   MRN: [id number]  Chief Complaint  Patient presents with   Shoulder Pain    Was seen in ED a month ago for should pain and not getting better    History of Present Illness: Bonnie Reed is a 77 yo F who complains of left shoulder pain onset 3 days ago after grilling out for a cookout and lifting a heavy bag of charcoal. Pain described sharp, radiating down left arm and to left upper back.  Patient was seen in ER in April 2024 for left sided chest pain. Did cardiac workup which was negative. Was given injection of Fentanyl and discharged. Pain did ease off until this past weekend after heavy lifting.  Injury: no  Treatments tried: Advil with some relief  Denies Chest pain, SHOB, palpitations, fatigue, diaphoresis, N/V, numbness in extremities.      The following portions of the patient's history were reviewed and updated as appropriate: past medical history, past surgical history, family history, social history, allergies, medications, and problem list.   Patient Active Problem List   Diagnosis Date Noted   B12 deficiency 03/03/2022   Insomnia 12/03/2021   Osteopenia due to cancer therapy 09/30/2021   Depression 07/22/2021   Anemia 07/22/2021   Malignant neoplasm of lower-inner quadrant of left breast in female, estrogen receptor positive (HCC) 07/07/2021   Right leg pain 02/08/2019   Hypochromic microcytic anemia 04/08/2018   Eczema 04/08/2018   Mixed hyperlipidemia 04/08/2018   Primary osteoarthritis of right knee 02/01/2018   Healthcare maintenance 04/21/2017   Rhinitis, chronic 03/22/2017   Type 2 diabetes mellitus with complication, with long-term current use of insulin (HCC) 10/16/2016   Hypertension 10/16/2016   CHF  (congestive heart failure) (HCC) 10/16/2016   Hypertrophy of nasal turbinates 06/16/2016   Calculus of kidney 03/31/2016   Abnormal nuclear stress test 03/31/2016   Hx of cardiac cath 03/31/2016   Gastroesophageal reflux disease without esophagitis 03/31/2016   Nontoxic goiter, unspecified 03/31/2016   Allergy to wheat 11/22/2014   Allergy to pollen 11/22/2014   Allergy to dog dander 11/22/2014   Cervical spondylosis without myelopathy 06/19/2011   Low back pain 06/19/2011   Lumbosacral spondylosis without myelopathy 06/19/2011   Neck pain 06/19/2011   CAD (coronary artery disease) 01/28/2011   Past Medical History:  Diagnosis Date   Allergy    Anemia    during knee replacement 15 years ago   Blood transfusion without reported diagnosis    Breast cancer (HCC) 1997   Left Breast Cancer   Cancer (HCC)    Cervical spondylosis    CHF (congestive heart failure) (HCC)    Coronary artery disease involving native coronary artery of native heart without angina pectoris    COVID 04/2021   mild case   Diabetes mellitus without complication (HCC)    Diverticulosis    GERD (gastroesophageal reflux disease)    without esophagitis   History of breast cancer    History of kidney stones    Hyperlipidemia    Hypertension    Lumbar spondylosis    Osteoarthritis    right knee   Osteopenia due to cancer therapy 09/30/2021   Other eczema    Personal history of radiation therapy 1997   Left Breast Cancer  Recurrent major depressive disorder (HCC)    Thyroid goiter    Past Surgical History:  Procedure Laterality Date   APPENDECTOMY     BREAST BIOPSY Right 08/01/2021   BREAST LUMPECTOMY Left 1997   CESAREAN SECTION     CHOLECYSTECTOMY     EVACUATION BREAST HEMATOMA Left 07/10/2022   Procedure: DRAIN PLACEMENT LEFT MASTECTOMY CAVITY;  Surgeon: Griselda Miner, MD;  Location: [facility]S;  Service: General;  Laterality: Left;   HEMATOMA EVACUATION Left 03/12/2022   Procedure: PLACEMENT OF  DRAIN LEFT CHEST WALL;  Surgeon: Griselda Miner, MD;  Location: [facility] OR;  Service: General;  Laterality: Left;   KNEE ARTHROSCOPY Right    LESION REMOVAL Left 08/25/2021   Procedure: EXCISION SKIN LESION LEFT CHEST WALL;  Surgeon: Griselda Miner, MD;  Location: [facility] OR;  Service: General;  Laterality: Left;   REDUCTION MAMMAPLASTY Right 1998   SIMPLE MASTECTOMY WITH AXILLARY SENTINEL NODE BIOPSY Left 08/25/2021   Procedure: LEFT MASTECTOMY;  Surgeon: Griselda Miner, MD;  Location: [facility] OR;  Service: General;  Laterality: Left;   TOTAL HIP ARTHROPLASTY Right    TUBAL LIGATION     Family History  Problem Relation Age of Onset   Stroke Mother    Kidney disease Mother    Dementia Mother    Cancer Father        lung   Colon cancer Neg Hx    Rectal cancer Neg Hx    Outpatient Medications Prior to Visit  Medication Sig Dispense Refill   ACCU-CHEK SOFTCLIX LANCETS lancets Use to check blood sugar three times daily or as directed. (Patient taking differently: 1 each by Other route See admin instructions. Use to check blood sugar three times daily or as directed.) 100 each 12   aspirin EC 81 MG tablet Take 81 mg by mouth in the morning.     atorvastatin (LIPITOR) 40 MG tablet Take 1 tablet (40 mg total) by mouth daily. 90 tablet 1   BD PEN NEEDLE NANO 2ND GEN 32G X 4 MM MISC USE AS DIRECTED THREE TIMES DAILY 100 each 0   Blood Glucose Monitoring Suppl (GLUCOCOM BLOOD GLUCOSE MONITOR) DEVI 1 each by Other route daily.     Carboxymethylcell-Hypromellose (GENTEAL OP) Place 1 drop into both eyes daily as needed (dry eyes).     cetirizine (ZYRTEC) 10 MG tablet Take 10 mg by mouth daily as needed for allergies.     Continuous Blood Gluc Receiver (FREESTYLE LIBRE 3 READER) DEVI 1 each by Does not apply route every 14 (fourteen) days. (Patient not taking: Reported on 07/09/2022) 2 each 3   denosumab (PROLIA) 60 MG/ML SOSY injection Inject 60 mg into the skin every 6 (six) months.     empagliflozin (JARDIANCE)  10 MG TABS tablet Take 1 tablet (10 mg total) by mouth daily. 90 tablet 1   esomeprazole (NEXIUM) 40 MG capsule Take 1 capsule (40 mg total) by mouth daily. 90 capsule 3   fluconazole (DIFLUCAN) 150 MG tablet Take 1 tablet after finishing the antibiotic and 1 tablet 3 days later if needed (Patient not taking: Reported on 07/03/2022) 2 tablet 0   fluticasone (FLONASE) 50 MCG/ACT nasal spray Place 2 sprays into both nostrils daily as needed for allergies. (Patient taking differently: Place 2 sprays into both nostrils in the morning and at bedtime.) 15.8 mL 3   folic acid (FOLVITE) 1 MG tablet Take 1 tablet (1 mg total) by mouth daily. 30 tablet 11  ibuprofen (ADVIL) 200 MG tablet Take 400 mg by mouth every 8 (eight) hours as needed (pain.).     insulin glargine (LANTUS SOLOSTAR) 100 UNIT/ML Solostar Pen Inject 20 Units into the skin 2 (two) times daily. 15 mL PRN   Lancet Devices (ONE TOUCH DELICA LANCING DEV) MISC Use to test 3 times a day Dx:E11.9 (Patient taking differently: 1 each by Other route See admin instructions. Use to test 3 times a day Dx:E11.9) 1 each 0   letrozole (FEMARA) 2.5 MG tablet Take 1 tablet (2.5 mg total) by mouth daily. 30 tablet 12   LINZESS 145 MCG CAPS capsule TAKE 1 CAPSULE BY MOUTH ONCE DAILY AS NEEDED FOR CONSTIPATION 30 capsule 0   nitroGLYCERIN (NITROSTAT) 0.4 MG SL tablet Place 1 tablet (0.4 mg total) under the tongue every 5 (five) minutes as needed for chest pain. 25 tablet 1   oxyCODONE (ROXICODONE) 5 MG immediate release tablet Take 1 tablet (5 mg total) by mouth every 6 (six) hours as needed for severe pain. 5 tablet 0   Semaglutide, 2 MG/DOSE, (OZEMPIC, 2 MG/DOSE,) 8 MG/3ML SOPN INJECT 2 MG INTO THE SKIN ONCE WEEKLY 3 mL 2   traZODone (DESYREL) 50 MG tablet Take 0.5-1 tablets (25-50 mg total) by mouth at bedtime as needed for sleep. (Patient taking differently: Take 50 mg by mouth at bedtime as needed for sleep.) 90 tablet 0   valsartan (DIOVAN) 80 MG tablet Take  1 tablet (80 mg total) by mouth daily. 90 tablet 1   Vitamin D, Ergocalciferol, (DRISDOL) 1.25 MG (50000 UNIT) CAPS capsule Take 50,000 Units by mouth every Monday.     No facility-administered medications prior to visit.   No Active Allergies   ROS:  A complete ROS was performed with pertinent positives/negatives noted in the HPI. The remainder of the ROS are negative.    Objective:   Today's Vitals   08/24/22 1044  BP: 122/60  Pulse: 85  Temp: 98.2 F (36.8 C)  TempSrc: Temporal  SpO2: 99%  Weight: 152 lb 3.2 oz (69 kg)  Height: 5\' 1"  (1.549 m)   Filed Weights   08/24/22 1044  Weight: 152 lb 3.2 oz (69 kg)    General: Well developed, well nourished. No acute distress. Skin: Warm and dry. No rashes. Neck: Supple. No lymphadenopathy. No thyromegaly. CV: RRR without murmurs or rubs. Pulses 2+ bilaterally. Left chest wall pain reproducible with palpation Lungs: Clear to auscultation bilaterally. No wheezing, rales or rhonchi. Extremities: Limited abduction and rotation to right shoulder secondary to pain.  No edema noted. Neuro: CN II-XII intact. Normal sensation. Psych: Alert and oriented. Normal mood and affect.  Lab Results No results found for any visits on 08/24/22.    Assessment & Plan:  1. Strain of left shoulder, initial encounter - alternate ice/heat  - rest no heavy lifting  - continue ibuprofen 600mg -800mg  every 8 hours as needed for pain - ketorolac (TORADOL) injection 60 mg - methocarbamol (ROBAXIN) 500 MG tablet; Take 1 tablet (500 mg total) by mouth every 8 (eight) hours as needed for up to 7 days for muscle spasms.  Dispense: 21 tablet; Refill: 0   Return if symptoms worsen or fail to improve.   Salvatore Decent, FNP

## 2022-09-01 ENCOUNTER — Other Ambulatory Visit: Payer: Self-pay | Admitting: Nurse Practitioner

## 2022-09-02 ENCOUNTER — Other Ambulatory Visit: Payer: Self-pay | Admitting: Hematology & Oncology

## 2022-09-07 ENCOUNTER — Inpatient Hospital Stay: Payer: Medicare HMO | Attending: Hematology & Oncology

## 2022-09-07 ENCOUNTER — Inpatient Hospital Stay: Payer: Medicare HMO | Admitting: Hematology & Oncology

## 2022-09-07 ENCOUNTER — Other Ambulatory Visit: Payer: Self-pay

## 2022-09-07 ENCOUNTER — Encounter: Payer: Self-pay | Admitting: Hematology & Oncology

## 2022-09-07 VITALS — BP 158/68 | HR 72 | Temp 98.5°F | Resp 18 | Ht 61.0 in | Wt 152.0 lb

## 2022-09-07 DIAGNOSIS — C50912 Malignant neoplasm of unspecified site of left female breast: Secondary | ICD-10-CM | POA: Insufficient documentation

## 2022-09-07 DIAGNOSIS — C50312 Malignant neoplasm of lower-inner quadrant of left female breast: Secondary | ICD-10-CM

## 2022-09-07 DIAGNOSIS — D563 Thalassemia minor: Secondary | ICD-10-CM | POA: Insufficient documentation

## 2022-09-07 DIAGNOSIS — Z170 Estrogen receptor positive status [ER+]: Secondary | ICD-10-CM | POA: Insufficient documentation

## 2022-09-07 DIAGNOSIS — Z79811 Long term (current) use of aromatase inhibitors: Secondary | ICD-10-CM | POA: Insufficient documentation

## 2022-09-07 LAB — CMP (CANCER CENTER ONLY)
ALT: 8 U/L (ref 0–44)
AST: 13 U/L — ABNORMAL LOW (ref 15–41)
Albumin: 4.3 g/dL (ref 3.5–5.0)
Alkaline Phosphatase: 168 U/L — ABNORMAL HIGH (ref 38–126)
Anion gap: 7 (ref 5–15)
BUN: 18 mg/dL (ref 8–23)
CO2: 29 mmol/L (ref 22–32)
Calcium: 10.8 mg/dL — ABNORMAL HIGH (ref 8.9–10.3)
Chloride: 107 mmol/L (ref 98–111)
Creatinine: 0.88 mg/dL (ref 0.44–1.00)
GFR, Estimated: >60 mL/min (ref >60)
Glucose, Bld: 119 mg/dL — ABNORMAL HIGH (ref 70–99)
Potassium: 4.4 mmol/L (ref 3.5–5.1)
Sodium: 143 mmol/L (ref 135–145)
Total Bilirubin: 0.4 mg/dL (ref 0.3–1.2)
Total Protein: 7.6 g/dL (ref 6.5–8.1)

## 2022-09-07 LAB — CBC WITH DIFFERENTIAL (CANCER CENTER ONLY)
Abs Immature Granulocytes: 0.02 10*3/uL (ref 0.00–0.07)
Basophils Absolute: 0.1 10*3/uL (ref 0.0–0.1)
Basophils Relative: 1 %
Eosinophils Absolute: 0.3 10*3/uL (ref 0.0–0.5)
Eosinophils Relative: 5 %
HCT: 40.6 % (ref 36.0–46.0)
Hemoglobin: 12.3 g/dL (ref 12.0–15.0)
Immature Granulocytes: 0 %
Lymphocytes Relative: 36 %
Lymphs Abs: 2.6 10*3/uL (ref 0.7–4.0)
MCH: 22.1 pg — ABNORMAL LOW (ref 26.0–34.0)
MCHC: 30.3 g/dL (ref 30.0–36.0)
MCV: 73 fL — ABNORMAL LOW (ref 80.0–100.0)
Monocytes Absolute: 0.8 10*3/uL (ref 0.1–1.0)
Monocytes Relative: 11 %
Neutro Abs: 3.5 10*3/uL (ref 1.7–7.7)
Neutrophils Relative %: 47 %
Platelet Count: 282 10*3/uL (ref 150–400)
RBC: 5.56 MIL/uL — ABNORMAL HIGH (ref 3.87–5.11)
RDW: 15.9 % — ABNORMAL HIGH (ref 11.5–15.5)
WBC Count: 7.3 10*3/uL (ref 4.0–10.5)
nRBC: 0 % (ref 0.0–0.2)

## 2022-09-07 LAB — LACTATE DEHYDROGENASE: LDH: 125 U/L (ref 98–192)

## 2022-09-07 MED ORDER — TRAMADOL HCL 50 MG PO TABS: 50.0000 mg | ORAL_TABLET | Freq: Four times a day (QID) | ORAL | 0 refills | Status: AC | PRN

## 2022-09-07 NOTE — Progress Notes (Signed)
Hematology and Oncology Follow Up Visit  Bonnie Reed [id number] Dec 10, 1945 77 y.o. 09/07/2022   Principle Diagnosis:  Invasive ductal carcinoma of the LEFT breast -- Stage 1 (T1cNxM0) --ER+/HER2- -- Oncotype = 3 Alpha thalassemia minor trait  Current Therapy:  S/P LEFT MRM on 08/25/2021  Folic acid 1 mg PO daily Femara 2.5 mg p.o. daily -start on 10/03/2021 Prolia 60 mg IM q. 12 months --next dose on 11/10/2022   Interim History:  Bonnie Reed is here today for follow-up.  She is not doing all that well.  She is having a lot of pain over on her left chest wall.  Again, I am unsure as why she is having all this pain.  The pain is in her shoulder and goes down her left arm.  There may be a little bit of swelling in the left arm.  It is not firm.  There is no hyperpigmentation.  She has fluid drained on occasion out of the left mastectomy site.  Again, I am not sure as to why she is having difficulties.  We clearly will have to get an MRI of the left shoulder to see if any is going on there.  Hopefully, there is nothing going on.  She feels okay otherwise.  I forgot to ask her about her trip up to [place] to see the [employer] musical.  Actually, this is going to [place] in the Fall.  She is eating okay.  She is having no nausea or vomiting.  She is having no cough or shortness of breath.  She is having no change in bowel or bladder habits.  She continues on the Femara.  She has been getting Prolia.  She is due for this in August.  Overall, I would say that her performance status is probably ECOG 1.     Medications:  Allergies as of 09/07/2022   No Active Allergies      Medication List        Accurate as of September 07, 2022 10:40 AM. If you have any questions, ask your nurse or doctor.          Accu-Chek Softclix Lancets lancets Use to check blood sugar three times daily or as directed. What changed:  how much to take how to take this when to take this    aspirin EC 81 MG tablet Take 81 mg by mouth in the morning.   atorvastatin 40 MG tablet Commonly known as: LIPITOR Take 1 tablet (40 mg total) by mouth daily.   BD Pen Needle Nano 2nd Gen 32G X 4 MM Misc Generic drug: Insulin Pen Needle USE AS DIRECTED THREE TIMES DAILY   cetirizine 10 MG tablet Commonly known as: ZYRTEC Take 10 mg by mouth daily as needed for allergies.   empagliflozin 10 MG Tabs tablet Commonly known as: Jardiance Take 1 tablet (10 mg total) by mouth daily.   esomeprazole 40 MG capsule Commonly known as: NexIUM Take 1 capsule (40 mg total) by mouth daily.   fluconazole 150 MG tablet Commonly known as: DIFLUCAN Take 1 tablet after finishing the antibiotic and 1 tablet 3 days later if needed   fluticasone 50 MCG/ACT nasal spray Commonly known as: FLONASE Place 2 sprays into both nostrils daily as needed for allergies. What changed: when to take this   folic acid 1 MG tablet Commonly known as: FOLVITE Take 1 tablet (1 mg total) by mouth daily.   FreeStyle [place] 3 Reader [place] 1 each  by Does not apply route every 14 (fourteen) days.   GENTEAL OP Place 1 drop into both eyes daily as needed (dry eyes).   GlucoCom Blood Glucose Monitor Devi 1 each by Other route daily.   ibuprofen 200 MG tablet Commonly known as: ADVIL Take 400 mg by mouth every 8 (eight) hours as needed (pain.).   Lantus SoloStar 100 UNIT/ML Solostar Pen Generic drug: insulin glargine Inject 20 Units into the skin 2 (two) times daily.   letrozole 2.5 MG tablet Commonly known as: FEMARA Take 1 tablet by mouth once daily   Linzess 145 MCG Caps capsule Generic drug: linaclotide TAKE 1 CAPSULE BY MOUTH ONCE DAILY AS NEEDED FOR CONSTIPATION   nitroGLYCERIN 0.4 MG SL tablet Commonly known as: NITROSTAT Place 1 tablet (0.4 mg total) under the tongue every 5 (five) minutes as needed for chest pain.   ONE TOUCH DELICA LANCING DEV Misc Use to test 3 times a day Dx:E11.9 What  changed:  how much to take how to take this when to take this   oxyCODONE 5 MG immediate release tablet Commonly known as: Roxicodone Take 1 tablet (5 mg total) by mouth every 6 (six) hours as needed for severe pain.   Ozempic (2 MG/DOSE) 8 MG/3ML Sopn Generic drug: Semaglutide (2 MG/DOSE) INJECT 2 MG INTO THE SKIN ONCE WEEKLY   Prolia 60 MG/ML Sosy injection Generic drug: denosumab Inject 60 mg into the skin every 6 (six) months.   traZODone 50 MG tablet Commonly known as: DESYREL TAKE 1/2 TO 1 (ONE-HALF TO ONE) TABLET BY MOUTH AT BEDTIME AS NEEDED FOR SLEEP   valsartan 80 MG tablet Commonly known as: DIOVAN Take 1 tablet (80 mg total) by mouth daily.   Vitamin D (Ergocalciferol) 1.25 MG (50000 UNIT) Caps capsule Commonly known as: DRISDOL Take 50,000 Units by mouth every Monday.        Allergies:  No Active Allergies   Past Medical History, Surgical history, Social history, and Family History were reviewed and updated.  Review of Systems: Review of Systems  Constitutional: Negative.   HENT: Negative.  Negative for hearing loss.   Eyes: Negative.   Respiratory: Negative.    Cardiovascular: Negative.   Gastrointestinal: Negative.   Genitourinary: Negative.   Musculoskeletal: Negative.   Skin: Negative.   Neurological: Negative.   Endo/Heme/Allergies: Negative.   Psychiatric/Behavioral: Negative.       Physical Exam: Vital signs are temperature 98.5.  Pulse 72.  Blood pressure 158/68.  Weight is 152 pounds.  Wt Readings from Last 3 Encounters:  08/24/22 152 lb 3.2 oz (69 kg)  07/10/22 159 lb (72.1 kg)  07/09/22 159 lb (72.1 kg)   Physical Exam Vitals reviewed.  Constitutional:      Comments: Breast exam shows right breast with no masses, edema or erythema.  There is no right axillary adenopathy.  Left chest wall does show the left mastectomy.  She has little bit of swelling about the mastectomy site.  There is no erythema or warmth.    There may be  little bit of tenderness at the lateral aspect of the mastectomy.  There is no left axillary lymph nodes.    HENT:     Head: Normocephalic and atraumatic.  Eyes:     Pupils: Pupils are equal, round, and reactive to light.  Cardiovascular:     Rate and Rhythm: Normal rate and regular rhythm.     Heart sounds: Normal heart sounds.     Comments: Cardiac exam shows  regular rate and rhythm with normal S1 and S2.  I do not hear any murmurs, rubs or bruits. Pulmonary:     Effort: Pulmonary effort is normal.     Breath sounds: Normal breath sounds.  Abdominal:     General: Bowel sounds are normal.     Palpations: Abdomen is soft.  Musculoskeletal:        General: No tenderness or deformity. Normal range of motion.     Cervical back: Normal range of motion.     Comments: Extremity shows some mild chronic swelling in the left arm.  There is no swelling in the legs.  Lymphadenopathy:     Cervical: No cervical adenopathy.  Skin:    General: Skin is warm and dry.     Findings: No erythema or rash.  Neurological:     Mental Status: She is alert and oriented to person, place, and time.  Psychiatric:        Behavior: Behavior normal.        Thought Content: Thought content normal.        Judgment: Judgment normal.     Lab Results  Component Value Date   WBC 7.3 09/07/2022   HGB 12.3 09/07/2022   HCT 40.6 09/07/2022   MCV 73.0 (L) 09/07/2022   PLT 282 09/07/2022   Lab Results  Component Value Date   FERRITIN 118 09/24/2021   IRON 85 09/24/2021   TIBC 417 09/24/2021   UIBC 332 09/24/2021   IRONPCTSAT 20 09/24/2021   Lab Results  Component Value Date   RETICCTPCT 1.9 09/24/2021   RBC 5.56 (H) 09/07/2022   No results found for: "KPAFRELGTCHN", "LAMBDASER", "KAPLAMBRATIO" No results found for: "IGGSERUM", "IGA", "IGMSERUM" No results found for: "TOTALPROTELP", "ALBUMINELP", "A1GS", "A2GS", "BETS", "BETA2SER", "GAMS", "MSPIKE", "SPEI"   Chemistry      Component Value Date/Time    NA 143 09/07/2022 0951   NA 141 02/08/2019 1049   K 4.4 09/07/2022 0951   CL 107 09/07/2022 0951   CO2 29 09/07/2022 0951   BUN 18 09/07/2022 0951   BUN 15 02/08/2019 1049   CREATININE 0.88 09/07/2022 0951      Component Value Date/Time   CALCIUM 10.8 (H) 09/07/2022 0951   ALKPHOS 168 (H) 09/07/2022 0951   AST 13 (L) 09/07/2022 0951   ALT 8 09/07/2022 0951   BILITOT 0.4 09/07/2022 0951       Impression and Plan: Bonnie Reed is a very pleasant 77 yo African American female with alpha thalassemia minor trait.  She has remote history of breast cancer of the left breast.  She now has another breast cancer.  Thankfully, this is early stage.  Even more important is the fact that it is estrogen positive and HER2 negative.  There is a very low Oncotype score.  Again, I am not sure as to why she is having Scemblix problems with pain.  We are going to clearly have to see about doing some radiographic studies.  We will have to see about getting an MRI of the left shoulder.  It almost sounds like there might be some neck issues.  We will have to see with the MRI shows.  I will send in some tramadol for her (58 mg p.o. every 6 hours as needed) and let see if this may help.  She may have to go back to see surgery.  I will plan to see her back in a month or so.  We will have to follow-up on  this closely.  Josph Macho, MD 6/17/202410:40 AM

## 2022-09-14 ENCOUNTER — Other Ambulatory Visit: Payer: Self-pay | Admitting: Nurse Practitioner

## 2022-09-17 NOTE — Telephone Encounter (Signed)
Requesting: Linzess 145 MCG Oral Capsule  Last Visit: 06/09/2022 Next Visit: Visit date not found Last Refill: 08/11/2022  Please Advise

## 2022-09-22 ENCOUNTER — Encounter: Payer: Self-pay | Admitting: Hematology & Oncology

## 2022-09-22 ENCOUNTER — Other Ambulatory Visit: Payer: Self-pay

## 2022-09-22 ENCOUNTER — Inpatient Hospital Stay: Payer: Medicare HMO | Attending: Gastroenterology

## 2022-09-22 ENCOUNTER — Inpatient Hospital Stay: Payer: Medicare HMO | Admitting: Hematology & Oncology

## 2022-09-22 VITALS — BP 113/71 | HR 78 | Temp 98.4°F | Resp 18 | Ht 61.0 in | Wt 149.0 lb

## 2022-09-22 DIAGNOSIS — Z170 Estrogen receptor positive status [ER+]: Secondary | ICD-10-CM | POA: Insufficient documentation

## 2022-09-22 DIAGNOSIS — C50912 Malignant neoplasm of unspecified site of left female breast: Secondary | ICD-10-CM | POA: Diagnosis not present

## 2022-09-22 DIAGNOSIS — M25512 Pain in left shoulder: Secondary | ICD-10-CM | POA: Insufficient documentation

## 2022-09-22 DIAGNOSIS — C50312 Malignant neoplasm of lower-inner quadrant of left female breast: Secondary | ICD-10-CM | POA: Diagnosis not present

## 2022-09-22 DIAGNOSIS — Z79811 Long term (current) use of aromatase inhibitors: Secondary | ICD-10-CM | POA: Diagnosis not present

## 2022-09-22 DIAGNOSIS — E119 Type 2 diabetes mellitus without complications: Secondary | ICD-10-CM | POA: Insufficient documentation

## 2022-09-22 DIAGNOSIS — D563 Thalassemia minor: Secondary | ICD-10-CM | POA: Insufficient documentation

## 2022-09-22 LAB — CMP (CANCER CENTER ONLY)
ALT: 8 U/L (ref 0–44)
AST: 14 U/L — ABNORMAL LOW (ref 15–41)
Albumin: 4.3 g/dL (ref 3.5–5.0)
Alkaline Phosphatase: 174 U/L — ABNORMAL HIGH (ref 38–126)
Anion gap: 8 (ref 5–15)
BUN: 14 mg/dL (ref 8–23)
CO2: 25 mmol/L (ref 22–32)
Calcium: 10 mg/dL (ref 8.9–10.3)
Chloride: 102 mmol/L (ref 98–111)
Creatinine: 0.88 mg/dL (ref 0.44–1.00)
GFR, Estimated: >60 mL/min (ref >60)
Glucose, Bld: 112 mg/dL — ABNORMAL HIGH (ref 70–99)
Potassium: 4 mmol/L (ref 3.5–5.1)
Sodium: 135 mmol/L (ref 135–145)
Total Bilirubin: 0.3 mg/dL (ref 0.3–1.2)
Total Protein: 7.9 g/dL (ref 6.5–8.1)

## 2022-09-22 LAB — CBC WITH DIFFERENTIAL (CANCER CENTER ONLY)
Abs Immature Granulocytes: 0.03 10*3/uL (ref 0.00–0.07)
Basophils Absolute: 0.1 10*3/uL (ref 0.0–0.1)
Basophils Relative: 1 %
Eosinophils Absolute: 0.4 10*3/uL (ref 0.0–0.5)
Eosinophils Relative: 5 %
HCT: 41.8 % (ref 36.0–46.0)
Hemoglobin: 12.6 g/dL (ref 12.0–15.0)
Immature Granulocytes: 0 %
Lymphocytes Relative: 35 %
Lymphs Abs: 2.9 10*3/uL (ref 0.7–4.0)
MCH: 21.8 pg — ABNORMAL LOW (ref 26.0–34.0)
MCHC: 30.1 g/dL (ref 30.0–36.0)
MCV: 72.3 fL — ABNORMAL LOW (ref 80.0–100.0)
Monocytes Absolute: 1 10*3/uL (ref 0.1–1.0)
Monocytes Relative: 12 %
Neutro Abs: 3.9 10*3/uL (ref 1.7–7.7)
Neutrophils Relative %: 47 %
Platelet Count: 270 10*3/uL (ref 150–400)
RBC: 5.78 MIL/uL — ABNORMAL HIGH (ref 3.87–5.11)
RDW: 15.7 % — ABNORMAL HIGH (ref 11.5–15.5)
WBC Count: 8.3 10*3/uL (ref 4.0–10.5)
nRBC: 0 % (ref 0.0–0.2)

## 2022-09-22 NOTE — Progress Notes (Signed)
Hematology and Oncology Follow Up Visit  Bonnie Reed [id number] 1946-01-28 77 y.o. 09/22/2022   Principle Diagnosis:  Invasive ductal carcinoma of the LEFT breast -- Stage 1 (T1cNxM0) --ER+/HER2- -- Oncotype = 3 Alpha thalassemia minor trait  Current Therapy:  S/P LEFT MRM on 08/25/2021  Folic acid 1 mg PO daily Femara 2.5 mg p.o. daily -start on 10/03/2021 Prolia 60 mg IM q. 12 months --next dose on 11/10/2022   Interim History:  Bonnie Reed is here today for follow-up.  Unfortunately, she is still having problems with pain on the left chest wall and shoulder.  Unfortunately, it does not look like the insurance company will let us do an MRI.  Her alkaline phosphatase is up a little bit.  As such, we will see about getting a bone scan on her.  I know that she is on Femara.  This might be contributing to some of the discomfort.  She has also lost quite a bit of weight.  I do not know if this might be a factor.  She is on Ozempic.  She has had no complaints elsewhere.  She does urinate a bit.  She does have diabetes.  She has had no fever.  There is been no bleeding.  She has had no cough.  Overall, I would say that her performance status is ECOG 1.       Medications:  Allergies as of 09/22/2022   No Active Allergies      Medication List        Accurate as of September 22, 2022  9:38 AM. If you have any questions, ask your nurse or doctor.          Accu-Chek Softclix Lancets lancets Use to check blood sugar three times daily or as directed. What changed:  how much to take how to take this when to take this   aspirin EC 81 MG tablet Take 81 mg by mouth in the morning.   atorvastatin 40 MG tablet Commonly known as: LIPITOR Take 1 tablet (40 mg total) by mouth daily.   BD Pen Needle Nano 2nd Gen 32G X 4 MM Misc Generic drug: Insulin Pen Needle USE AS DIRECTED THREE TIMES DAILY   cetirizine 10 MG tablet Commonly known as: ZYRTEC Take 10 mg by mouth daily as  needed for allergies.   clobetasol ointment 0.05 % Commonly known as: TEMOVATE Apply 1 Application topically as needed.   empagliflozin 10 MG Tabs tablet Commonly known as: Jardiance Take 1 tablet (10 mg total) by mouth daily.   esomeprazole 40 MG capsule Commonly known as: NexIUM Take 1 capsule (40 mg total) by mouth daily.   fluconazole 150 MG tablet Commonly known as: DIFLUCAN Take 1 tablet after finishing the antibiotic and 1 tablet 3 days later if needed   fluticasone 50 MCG/ACT nasal spray Commonly known as: FLONASE Place 2 sprays into both nostrils daily as needed for allergies. What changed: when to take this   folic acid 1 MG tablet Commonly known as: FOLVITE Take 1 tablet (1 mg total) by mouth daily.   FreeStyle Libre 3 Reader Devi 1 each by Does not apply route every 14 (fourteen) days.   GENTEAL OP Place 1 drop into both eyes daily as needed (dry eyes).   GlucoCom Blood Glucose Monitor Devi 1 each by Other route daily.   ibuprofen 200 MG tablet Commonly known as: ADVIL Take 400 mg by mouth every 8 (eight) hours as needed (pain.).   Lantus  SoloStar 100 UNIT/ML Solostar Pen Generic drug: insulin glargine Inject 20 Units into the skin 2 (two) times daily.   letrozole 2.5 MG tablet Commonly known as: FEMARA Take 1 tablet by mouth once daily   Linzess 145 MCG Caps capsule Generic drug: linaclotide TAKE 1 CAPSULE BY MOUTH ONCE DAILY AS NEEDED FOR CONSTIPATION   nitroGLYCERIN 0.4 MG SL tablet Commonly known as: NITROSTAT Place 1 tablet (0.4 mg total) under the tongue every 5 (five) minutes as needed for chest pain.   ONE TOUCH DELICA LANCING DEV Misc Use to test 3 times a day Dx:E11.9 What changed:  how much to take how to take this when to take this   oxyCODONE 5 MG immediate release tablet Commonly known as: Roxicodone Take 1 tablet (5 mg total) by mouth every 6 (six) hours as needed for severe pain.   Ozempic (2 MG/DOSE) 8 MG/3ML  Sopn Generic drug: Semaglutide (2 MG/DOSE) INJECT 2 MG INTO THE SKIN ONCE WEEKLY   Prolia 60 MG/ML Sosy injection Generic drug: denosumab Inject 60 mg into the skin every 6 (six) months.   traMADol 50 MG tablet Commonly known as: ULTRAM Take 1 tablet (50 mg total) by mouth every 6 (six) hours as needed.   traZODone 50 MG tablet Commonly known as: DESYREL TAKE 1/2 TO 1 (ONE-HALF TO ONE) TABLET BY MOUTH AT BEDTIME AS NEEDED FOR SLEEP   valsartan 80 MG tablet Commonly known as: DIOVAN Take 1 tablet (80 mg total) by mouth daily.   Vitamin D (Ergocalciferol) 1.25 MG (50000 UNIT) Caps capsule Commonly known as: DRISDOL Take 50,000 Units by mouth every Monday.        Allergies:  No Active Allergies   Past Medical History, Surgical history, Social history, and Family History were reviewed and updated.  Review of Systems: Review of Systems  Constitutional: Negative.   HENT: Negative.  Negative for hearing loss.   Eyes: Negative.   Respiratory: Negative.    Cardiovascular: Negative.   Gastrointestinal: Negative.   Genitourinary: Negative.   Musculoskeletal: Negative.   Skin: Negative.   Neurological: Negative.   Endo/Heme/Allergies: Negative.   Psychiatric/Behavioral: Negative.       Physical Exam: Vital signs are temperature 98.5.  Pulse 72.  Blood pressure 158/68.  Weight is 152 pounds.  Wt Readings from Last 3 Encounters:  09/22/22 149 lb (67.6 kg)  09/07/22 152 lb (68.9 kg)  08/24/22 152 lb 3.2 oz (69 kg)   Physical Exam Vitals reviewed.  Constitutional:      Comments: Breast exam shows right breast with no masses, edema or erythema.  There is no right axillary adenopathy.  Left chest wall does show the left mastectomy.  She has little bit of swelling about the mastectomy site.  There is no erythema or warmth.    There may be little bit of tenderness at the lateral aspect of the mastectomy.  There is no left axillary lymph nodes.    HENT:     Head:  Normocephalic and atraumatic.  Eyes:     Pupils: Pupils are equal, round, and reactive to light.  Cardiovascular:     Rate and Rhythm: Normal rate and regular rhythm.     Heart sounds: Normal heart sounds.     Comments: Cardiac exam shows regular rate and rhythm with normal S1 and S2.  I do not hear any murmurs, rubs or bruits. Pulmonary:     Effort: Pulmonary effort is normal.     Breath sounds: Normal breath sounds.  Abdominal:     General: Bowel sounds are normal.     Palpations: Abdomen is soft.  Musculoskeletal:        General: No tenderness or deformity. Normal range of motion.     Cervical back: Normal range of motion.     Comments: Extremity shows some mild chronic swelling in the left arm.  There is no swelling in the legs.  Lymphadenopathy:     Cervical: No cervical adenopathy.  Skin:    General: Skin is warm and dry.     Findings: No erythema or rash.  Neurological:     Mental Status: She is alert and oriented to person, place, and time.  Psychiatric:        Behavior: Behavior normal.        Thought Content: Thought content normal.        Judgment: Judgment normal.      Lab Results  Component Value Date   WBC 8.3 09/22/2022   HGB 12.6 09/22/2022   HCT 41.8 09/22/2022   MCV 72.3 (L) 09/22/2022   PLT 270 09/22/2022   Lab Results  Component Value Date   FERRITIN 118 09/24/2021   IRON 85 09/24/2021   TIBC 417 09/24/2021   UIBC 332 09/24/2021   IRONPCTSAT 20 09/24/2021   Lab Results  Component Value Date   RETICCTPCT 1.9 09/24/2021   RBC 5.78 (H) 09/22/2022   No results found for: "KPAFRELGTCHN", "LAMBDASER", "KAPLAMBRATIO" No results found for: "IGGSERUM", "IGA", "IGMSERUM" No results found for: "TOTALPROTELP", "ALBUMINELP", "A1GS", "A2GS", "BETS", "BETA2SER", "GAMS", "MSPIKE", "SPEI"   Chemistry      Component Value Date/Time   NA 135 09/22/2022 0859   NA 141 02/08/2019 1049   K 4.0 09/22/2022 0859   CL 102 09/22/2022 0859   CO2 25 09/22/2022  0859   BUN 14 09/22/2022 0859   BUN 15 02/08/2019 1049   CREATININE 0.88 09/22/2022 0859      Component Value Date/Time   CALCIUM 10.0 09/22/2022 0859   ALKPHOS 174 (H) 09/22/2022 0859   AST 14 (L) 09/22/2022 0859   ALT 8 09/22/2022 0859   BILITOT 0.3 09/22/2022 0859       Impression and Plan: Bonnie Reed is a very pleasant 77 yo African American female with alpha thalassemia minor trait.  She has remote history of breast cancer of the left breast.  She now has another breast cancer.  Thankfully, this is early stage.  Even more important is the fact that it is estrogen positive and HER2 negative.  There is a very low Oncotype score.  Again, I am not sure as to why she is having the pain problems.  We will have to see what the bone scan shows.  It would be nice to do an MRI.  We may have to think about using a gabapentin.  Maybe what she is having is some kind of postmastectomy pain.  If that is the case, then gabapentin might be worthwhile trying.  Again, we will see with the bone scan shows.  We also might take her off the Femara for a little bit.  This might be a factor.  I would like to have her come back to see [place] in another 3 weeks or so.  Marland Kitchen  Josph Macho, MD 7/2/20249:38 AM

## 2022-09-25 ENCOUNTER — Other Ambulatory Visit: Payer: Self-pay | Admitting: Nurse Practitioner

## 2022-09-26 ENCOUNTER — Encounter (HOSPITAL_COMMUNITY): Payer: Self-pay

## 2022-09-26 ENCOUNTER — Ambulatory Visit (HOSPITAL_COMMUNITY): Payer: Medicare HMO

## 2022-10-01 ENCOUNTER — Ambulatory Visit (HOSPITAL_COMMUNITY)
Admission: RE | Admit: 2022-10-01 | Discharge: 2022-10-01 | Disposition: A | Discharge status: Home / Self Care | Payer: Medicare HMO | Source: Ambulatory Visit | Attending: Hematology & Oncology | Admitting: Hematology & Oncology

## 2022-10-01 DIAGNOSIS — Z170 Estrogen receptor positive status [ER+]: Secondary | ICD-10-CM | POA: Insufficient documentation

## 2022-10-01 DIAGNOSIS — M25512 Pain in left shoulder: Secondary | ICD-10-CM | POA: Diagnosis not present

## 2022-10-01 DIAGNOSIS — C50312 Malignant neoplasm of lower-inner quadrant of left female breast: Secondary | ICD-10-CM | POA: Diagnosis not present

## 2022-10-01 DIAGNOSIS — M19012 Primary osteoarthritis, left shoulder: Secondary | ICD-10-CM | POA: Diagnosis not present

## 2022-10-01 MED ORDER — GADOBUTROL 1 MMOL/ML IV SOLN
7.0000 mL | Freq: Once | INTRAVENOUS | Status: AC | PRN
  Administered 2022-10-01: 7 mL via INTRAVENOUS

## 2022-10-02 ENCOUNTER — Telehealth: Payer: Self-pay

## 2022-10-02 DIAGNOSIS — Z170 Estrogen receptor positive status [ER+]: Secondary | ICD-10-CM

## 2022-10-02 MED ORDER — ANASTROZOLE 1 MG PO TABS
1.0000 mg | ORAL_TABLET | Freq: Every day | ORAL | 2 refills | Status: DC
Start: 2022-10-02 — End: 2023-01-22

## 2022-10-02 NOTE — Telephone Encounter (Signed)
Spoke with pt who states that she is established with Dr Charlann Boxer at [facility] in [place]. Sent MRI scan to him. Requested pt f/u with him regarding the changes on her MRI scan and pt agreed to contact their office.  Pt is also requesting to come off the Letrozole and start back on Tamoxifen. Pt took this 25 years ago with good results. Pt states she thinks the Letrozole is causing her dizziness, fluid and pain in the L breast and she read the side effects and didn't like what she read.  Per Dr Myna Hidalgo she needs to f/u with her surgeon who did the mastectomy regarding the pain and fluid in the L breast. He does not want her back on Tamoxifen and is going to prescribe Arimdex 1 mg to take daily. Prescription sent to request pharmacy. Dr Myna Hidalgo would also like to speak to Dr Charlann Boxer regarding the MRI results- call placed to him, awaiting a CB. Pt advised and states she has a f/u with her surgeon on 10/05/22. Pt stated understanding to other responses.

## 2022-10-02 NOTE — Telephone Encounter (Signed)
-----   Message from Josph Macho sent at 10/01/2022  6:03 PM EDT ----- Call and let her know that the MRI shows a lot of degeneration.  She has some partial tears.  She really needs to see orthopedic surgery.  Has she seen an orthopedist before?.  If not, we need to send her to Dr. Jerl Santos of [facility].  Thanks.Marland Kitchen

## 2022-10-05 ENCOUNTER — Other Ambulatory Visit: Payer: Self-pay | Admitting: Nurse Practitioner

## 2022-10-05 DIAGNOSIS — E118 Type 2 diabetes mellitus with unspecified complications: Secondary | ICD-10-CM

## 2022-10-05 NOTE — Telephone Encounter (Signed)
Refill request for  Jardiance 10 mg LR 12/03/21, #90, 1 rf LOV  03/03/22 FOV  none scheduled.    Please review and advise.  Thanks. Dm/cma

## 2022-10-14 ENCOUNTER — Other Ambulatory Visit: Payer: Self-pay

## 2022-10-14 DIAGNOSIS — D509 Iron deficiency anemia, unspecified: Secondary | ICD-10-CM

## 2022-10-15 ENCOUNTER — Encounter: Payer: Self-pay | Admitting: Hematology & Oncology

## 2022-10-15 ENCOUNTER — Inpatient Hospital Stay: Payer: Medicare HMO | Admitting: Hematology & Oncology

## 2022-10-15 ENCOUNTER — Other Ambulatory Visit: Payer: Self-pay

## 2022-10-15 ENCOUNTER — Inpatient Hospital Stay: Payer: Medicare HMO

## 2022-10-15 VITALS — BP 139/62 | HR 72 | Temp 99.0°F | Resp 18 | Ht 61.0 in | Wt 149.0 lb

## 2022-10-15 DIAGNOSIS — Z79811 Long term (current) use of aromatase inhibitors: Secondary | ICD-10-CM | POA: Diagnosis not present

## 2022-10-15 DIAGNOSIS — C50912 Malignant neoplasm of unspecified site of left female breast: Secondary | ICD-10-CM | POA: Diagnosis not present

## 2022-10-15 DIAGNOSIS — C50312 Malignant neoplasm of lower-inner quadrant of left female breast: Secondary | ICD-10-CM | POA: Diagnosis not present

## 2022-10-15 DIAGNOSIS — D509 Iron deficiency anemia, unspecified: Secondary | ICD-10-CM

## 2022-10-15 DIAGNOSIS — Z170 Estrogen receptor positive status [ER+]: Secondary | ICD-10-CM | POA: Diagnosis not present

## 2022-10-15 DIAGNOSIS — D563 Thalassemia minor: Secondary | ICD-10-CM | POA: Diagnosis not present

## 2022-10-15 DIAGNOSIS — E119 Type 2 diabetes mellitus without complications: Secondary | ICD-10-CM | POA: Diagnosis not present

## 2022-10-15 DIAGNOSIS — M25512 Pain in left shoulder: Secondary | ICD-10-CM | POA: Diagnosis not present

## 2022-10-15 LAB — CBC WITH DIFFERENTIAL (CANCER CENTER ONLY)
Abs Immature Granulocytes: 0.01 10*3/uL (ref 0.00–0.07)
Basophils Absolute: 0.1 10*3/uL (ref 0.0–0.1)
Basophils Relative: 1 %
Eosinophils Absolute: 0.4 10*3/uL (ref 0.0–0.5)
Eosinophils Relative: 5 %
HCT: 40.5 % (ref 36.0–46.0)
Hemoglobin: 12.2 g/dL (ref 12.0–15.0)
Immature Granulocytes: 0 %
Lymphocytes Relative: 36 %
Lymphs Abs: 2.9 10*3/uL (ref 0.7–4.0)
MCH: 22 pg — ABNORMAL LOW (ref 26.0–34.0)
MCHC: 30.1 g/dL (ref 30.0–36.0)
MCV: 73 fL — ABNORMAL LOW (ref 80.0–100.0)
Monocytes Absolute: 0.9 10*3/uL (ref 0.1–1.0)
Monocytes Relative: 11 %
Neutro Abs: 3.7 10*3/uL (ref 1.7–7.7)
Neutrophils Relative %: 47 %
Platelet Count: 301 10*3/uL (ref 150–400)
RBC: 5.55 MIL/uL — ABNORMAL HIGH (ref 3.87–5.11)
RDW: 15.9 % — ABNORMAL HIGH (ref 11.5–15.5)
WBC Count: 7.9 10*3/uL (ref 4.0–10.5)
nRBC: 0 % (ref 0.0–0.2)

## 2022-10-15 LAB — LACTATE DEHYDROGENASE: LDH: 131 U/L (ref 98–192)

## 2022-10-15 LAB — CMP (CANCER CENTER ONLY)
ALT: 9 U/L (ref 0–44)
AST: 13 U/L — ABNORMAL LOW (ref 15–41)
Albumin: 4.2 g/dL (ref 3.5–5.0)
Alkaline Phosphatase: 172 U/L — ABNORMAL HIGH (ref 38–126)
Anion gap: 8 (ref 5–15)
BUN: 14 mg/dL (ref 8–23)
CO2: 28 mmol/L (ref 22–32)
Calcium: 10.5 mg/dL — ABNORMAL HIGH (ref 8.9–10.3)
Chloride: 106 mmol/L (ref 98–111)
Creatinine: 0.8 mg/dL (ref 0.44–1.00)
GFR, Estimated: >60 mL/min (ref >60)
Glucose, Bld: 103 mg/dL — ABNORMAL HIGH (ref 70–99)
Potassium: 4.1 mmol/L (ref 3.5–5.1)
Sodium: 142 mmol/L (ref 135–145)
Total Bilirubin: 0.4 mg/dL (ref 0.3–1.2)
Total Protein: 7.4 g/dL (ref 6.5–8.1)

## 2022-10-15 NOTE — Progress Notes (Signed)
Hematology and Oncology Follow Up Visit  Bonnie Reed [id number] 09/18/1945 77 y.o. 10/15/2022   Principle Diagnosis:  Invasive ductal carcinoma of the LEFT breast -- Stage 1 (T1cNxM0) --ER+/HER2- -- Oncotype = 3 Alpha thalassemia minor trait  Current Therapy:  S/P LEFT MRM on 08/25/2021  Folic acid 1 mg PO daily Femara 2.5 mg p.o. daily -start on 10/03/2021 Prolia 60 mg IM q. 12 months --next dose on 11/10/2022   Interim History:  Bonnie Reed is here today for follow-up.  She has an appointment to see Orthopedic Surgery next week.  This is for her left shoulder.  She had MRI of the shoulder back on 10/01/2022.  This does show a lot of tendinosis.  She may have had a partial tear of one of the rotator cuff tendons.  She had arthritis in the bones.  Hopefully, she will not need surgery.  I suspect that that she will have some cortisone injections.  Otherwise, she is feeling okay.  She has had no problems with nausea or vomiting.  She has had no problems with cough or shortness of breath.   She has had elevated alkaline phosphatase.  We have set her up with a bone scan.  I will have to see when this will be done.  Currently, I would have said that her performance status is probably ECOG 1.       Medications:  Allergies as of 10/15/2022   No Active Allergies      Medication List        Accurate as of October 15, 2022 10:20 AM. If you have any questions, ask your nurse or doctor.          Accu-Chek Softclix Lancets lancets Use to check blood sugar three times daily or as directed. What changed:  how much to take how to take this when to take this   anastrozole 1 MG tablet Commonly known as: ARIMIDEX Take 1 tablet (1 mg total) by mouth daily.   aspirin EC 81 MG tablet Take 81 mg by mouth in the morning.   atorvastatin 40 MG tablet Commonly known as: LIPITOR Take 1 tablet (40 mg total) by mouth daily.   BD Pen Needle Nano 2nd Gen 32G X 4 MM Misc Generic drug: Insulin  Pen Needle USE AS DIRECTED THREE TIMES DAILY   cetirizine 10 MG tablet Commonly known as: ZYRTEC Take 10 mg by mouth daily as needed for allergies.   clobetasol ointment 0.05 % Commonly known as: TEMOVATE Apply 1 Application topically as needed.   esomeprazole 40 MG capsule Commonly known as: NexIUM Take 1 capsule (40 mg total) by mouth daily.   fluconazole 150 MG tablet Commonly known as: DIFLUCAN Take 1 tablet after finishing the antibiotic and 1 tablet 3 days later if needed   fluticasone 50 MCG/ACT nasal spray Commonly known as: FLONASE Place 2 sprays into both nostrils daily as needed for allergies. What changed: when to take this   folic acid 1 MG tablet Commonly known as: FOLVITE Take 1 tablet (1 mg total) by mouth daily.   FreeStyle Libre 3 Reader Devi 1 each by Does not apply route every 14 (fourteen) days.   GENTEAL OP Place 1 drop into both eyes daily as needed (dry eyes).   GlucoCom Blood Glucose Monitor Devi 1 each by Other route daily.   ibuprofen 200 MG tablet Commonly known as: ADVIL Take 400 mg by mouth every 8 (eight) hours as needed (pain.).   Jardiance 10  MG Tabs tablet Generic drug: empagliflozin Take 1 tablet by mouth once daily   Lantus SoloStar 100 UNIT/ML Solostar Pen Generic drug: insulin glargine Inject 20 Units into the skin 2 (two) times daily.   letrozole 2.5 MG tablet Commonly known as: FEMARA Take 1 tablet by mouth once daily   Linzess 145 MCG Caps capsule Generic drug: linaclotide TAKE 1 CAPSULE BY MOUTH ONCE DAILY AS NEEDED FOR CONSTIPATION   nitroGLYCERIN 0.4 MG SL tablet Commonly known as: NITROSTAT Place 1 tablet (0.4 mg total) under the tongue every 5 (five) minutes as needed for chest pain.   ONE TOUCH DELICA LANCING DEV Misc Use to test 3 times a day Dx:E11.9 What changed:  how much to take how to take this when to take this   oxyCODONE 5 MG immediate release tablet Commonly known as: Roxicodone Take 1  tablet (5 mg total) by mouth every 6 (six) hours as needed for severe pain.   Ozempic (2 MG/DOSE) 8 MG/3ML Sopn Generic drug: Semaglutide (2 MG/DOSE) INJECT 2 MG INTO THE SKIN ONCE WEEKLY   Prolia 60 MG/ML Sosy injection Generic drug: denosumab Inject 60 mg into the skin every 6 (six) months.   traMADol 50 MG tablet Commonly known as: ULTRAM Take 1 tablet (50 mg total) by mouth every 6 (six) hours as needed.   traZODone 50 MG tablet Commonly known as: DESYREL TAKE 1/2 TO 1 (ONE-HALF TO ONE) TABLET BY MOUTH AT BEDTIME AS NEEDED FOR SLEEP   valsartan 80 MG tablet Commonly known as: DIOVAN Take 1 tablet (80 mg total) by mouth daily.   Vitamin D (Ergocalciferol) 1.25 MG (50000 UNIT) Caps capsule Commonly known as: DRISDOL Take 50,000 Units by mouth every Monday.        Allergies:  No Active Allergies   Past Medical History, Surgical history, Social history, and Family History were reviewed and updated.  Review of Systems: Review of Systems  Constitutional: Negative.   HENT: Negative.  Negative for hearing loss.   Eyes: Negative.   Respiratory: Negative.    Cardiovascular: Negative.   Gastrointestinal: Negative.   Genitourinary: Negative.   Musculoskeletal: Negative.   Skin: Negative.   Neurological: Negative.   Endo/Heme/Allergies: Negative.   Psychiatric/Behavioral: Negative.       Physical Exam: Vital signs are temperature 99.0.  Pulse 72.  Blood pressure 139/62.  Weight is 149 pounds.    Wt Readings from Last 3 Encounters:  10/15/22 149 lb (67.6 kg)  09/22/22 149 lb (67.6 kg)  09/07/22 152 lb (68.9 kg)   Physical Exam Vitals reviewed.  Constitutional:      Comments: Breast exam shows right breast with no masses, edema or erythema.  There is no right axillary adenopathy.  Left chest wall does show the left mastectomy.  She has little bit of swelling about the mastectomy site.  There is no erythema or warmth.    There may be little bit of tenderness at the  lateral aspect of the mastectomy.  There is no left axillary lymph nodes.    HENT:     Head: Normocephalic and atraumatic.  Eyes:     Pupils: Pupils are equal, round, and reactive to light.  Cardiovascular:     Rate and Rhythm: Normal rate and regular rhythm.     Heart sounds: Normal heart sounds.     Comments: Cardiac exam shows regular rate and rhythm with normal S1 and S2.  I do not hear any murmurs, rubs or bruits. Pulmonary:  Effort: Pulmonary effort is normal.     Breath sounds: Normal breath sounds.  Abdominal:     General: Bowel sounds are normal.     Palpations: Abdomen is soft.  Musculoskeletal:        General: No tenderness or deformity. Normal range of motion.     Cervical back: Normal range of motion.     Comments: Extremity shows some mild chronic swelling in the left arm.  There is no swelling in the legs.  Lymphadenopathy:     Cervical: No cervical adenopathy.  Skin:    General: Skin is warm and dry.     Findings: No erythema or rash.  Neurological:     Mental Status: She is alert and oriented to person, place, and time.  Psychiatric:        Behavior: Behavior normal.        Thought Content: Thought content normal.        Judgment: Judgment normal.      Lab Results  Component Value Date   WBC 7.9 10/15/2022   HGB 12.2 10/15/2022   HCT 40.5 10/15/2022   MCV 73.0 (L) 10/15/2022   PLT 301 10/15/2022   Lab Results  Component Value Date   FERRITIN 118 09/24/2021   IRON 85 09/24/2021   TIBC 417 09/24/2021   UIBC 332 09/24/2021   IRONPCTSAT 20 09/24/2021   Lab Results  Component Value Date   RETICCTPCT 1.9 09/24/2021   RBC 5.55 (H) 10/15/2022   No results found for: "KPAFRELGTCHN", "LAMBDASER", "KAPLAMBRATIO" No results found for: "IGGSERUM", "IGA", "IGMSERUM" No results found for: "TOTALPROTELP", "ALBUMINELP", "A1GS", "A2GS", "BETS", "BETA2SER", "GAMS", "MSPIKE", "SPEI"   Chemistry      Component Value Date/Time   NA 142 10/15/2022 0907   NA  141 02/08/2019 1049   K 4.1 10/15/2022 0907   CL 106 10/15/2022 0907   CO2 28 10/15/2022 0907   BUN 14 10/15/2022 0907   BUN 15 02/08/2019 1049   CREATININE 0.80 10/15/2022 0907      Component Value Date/Time   CALCIUM 10.5 (H) 10/15/2022 0907   ALKPHOS 172 (H) 10/15/2022 0907   AST 13 (L) 10/15/2022 0907   ALT 9 10/15/2022 0907   BILITOT 0.4 10/15/2022 0907       Impression and Plan: Ms. Morais is a very pleasant 77 yo African American female with alpha thalassemia minor trait.  She has remote history of breast cancer of the left breast.  She now has another breast cancer.  Thankfully, this is early stage.  Even more important is the fact that it is estrogen positive and HER2 negative.  There is a very low Oncotype score.  It will be interesting to see what Orthopedic Surgery has to say about her left shoulder.  Again, she needs surgery, I do not see a problem with this.  Will have to find out why her bone scan has not yet been done.  Her alkaline phosphatase is still little elevated.  Again a bone scan will help [place].  I will try to see her back now in about 6 weeks or so.    Marland Kitchen  Josph Macho, MD 7/25/202410:20 AM

## 2022-10-16 ENCOUNTER — Other Ambulatory Visit: Payer: Self-pay | Admitting: Nurse Practitioner

## 2022-10-16 NOTE — Telephone Encounter (Signed)
Requesting: Linzess 145 MCG Oral Capsule  Last Visit: 06/09/2022 Next Visit: 04/23/2023 Last Refill: 09/17/2022  Please Advise

## 2022-10-21 ENCOUNTER — Other Ambulatory Visit (HOSPITAL_COMMUNITY): Payer: Medicare HMO

## 2022-10-21 ENCOUNTER — Encounter (HOSPITAL_COMMUNITY)
Admission: RE | Admit: 2022-10-21 | Discharge: 2022-10-21 | Disposition: A | Discharge status: Home / Self Care | Payer: Medicare HMO | Source: Ambulatory Visit | Attending: Hematology & Oncology | Admitting: Hematology & Oncology

## 2022-10-21 ENCOUNTER — Ambulatory Visit (HOSPITAL_COMMUNITY)
Admission: RE | Admit: 2022-10-21 | Discharge: 2022-10-21 | Disposition: A | Discharge status: Home / Self Care | Payer: Medicare HMO | Source: Ambulatory Visit | Attending: Hematology & Oncology | Admitting: Hematology & Oncology

## 2022-10-21 DIAGNOSIS — R0789 Other chest pain: Secondary | ICD-10-CM | POA: Diagnosis not present

## 2022-10-21 DIAGNOSIS — M542 Cervicalgia: Secondary | ICD-10-CM | POA: Diagnosis not present

## 2022-10-21 DIAGNOSIS — Z471 Aftercare following joint replacement surgery: Secondary | ICD-10-CM | POA: Diagnosis not present

## 2022-10-21 DIAGNOSIS — C50312 Malignant neoplasm of lower-inner quadrant of left female breast: Secondary | ICD-10-CM | POA: Diagnosis not present

## 2022-10-21 DIAGNOSIS — Z96641 Presence of right artificial hip joint: Secondary | ICD-10-CM | POA: Diagnosis not present

## 2022-10-21 DIAGNOSIS — M25512 Pain in left shoulder: Secondary | ICD-10-CM | POA: Diagnosis not present

## 2022-10-21 DIAGNOSIS — Z170 Estrogen receptor positive status [ER+]: Secondary | ICD-10-CM | POA: Diagnosis not present

## 2022-10-21 DIAGNOSIS — C50919 Malignant neoplasm of unspecified site of unspecified female breast: Secondary | ICD-10-CM | POA: Diagnosis not present

## 2022-10-21 MED ORDER — TECHNETIUM TC 99M MEDRONATE IV KIT
20.0000 | PACK | Freq: Once | INTRAVENOUS | Status: AC | PRN
  Administered 2022-10-21: 20.8 via INTRAVENOUS

## 2022-10-22 ENCOUNTER — Other Ambulatory Visit: Payer: Self-pay | Admitting: Nurse Practitioner

## 2022-10-22 NOTE — Telephone Encounter (Signed)
Requesting: Ozempic (2 MG/DOSE) 8 MG/3ML Subcutaneous Solution Pen-injector  Last Visit: 06/09/2022 Next Visit: 04/23/2023 Last Refill: 09/25/2022  Please Advise

## 2022-10-27 DIAGNOSIS — M5030 Other cervical disc degeneration, unspecified cervical region: Secondary | ICD-10-CM | POA: Diagnosis not present

## 2022-10-27 DIAGNOSIS — M5412 Radiculopathy, cervical region: Secondary | ICD-10-CM | POA: Diagnosis not present

## 2022-11-02 DIAGNOSIS — M5412 Radiculopathy, cervical region: Secondary | ICD-10-CM | POA: Diagnosis not present

## 2022-11-09 DIAGNOSIS — N6489 Other specified disorders of breast: Secondary | ICD-10-CM | POA: Diagnosis not present

## 2022-11-13 ENCOUNTER — Other Ambulatory Visit: Payer: Self-pay | Admitting: Nurse Practitioner

## 2022-11-15 DIAGNOSIS — M5412 Radiculopathy, cervical region: Secondary | ICD-10-CM | POA: Diagnosis not present

## 2022-11-15 DIAGNOSIS — F40232 Fear of other medical care: Secondary | ICD-10-CM | POA: Diagnosis not present

## 2022-11-16 ENCOUNTER — Other Ambulatory Visit: Payer: Self-pay

## 2022-11-16 DIAGNOSIS — D563 Thalassemia minor: Secondary | ICD-10-CM

## 2022-11-16 MED ORDER — FOLIC ACID 1 MG PO TABS: 1.0000 mg | ORAL_TABLET | Freq: Every day | ORAL | 11 refills | Status: AC

## 2022-11-17 DIAGNOSIS — C50312 Malignant neoplasm of lower-inner quadrant of left female breast: Secondary | ICD-10-CM | POA: Diagnosis not present

## 2022-11-17 DIAGNOSIS — Z170 Estrogen receptor positive status [ER+]: Secondary | ICD-10-CM | POA: Diagnosis not present

## 2022-11-18 ENCOUNTER — Other Ambulatory Visit: Payer: Self-pay | Admitting: Family

## 2022-12-03 ENCOUNTER — Inpatient Hospital Stay: Payer: Medicare HMO | Attending: Gastroenterology

## 2022-12-03 ENCOUNTER — Other Ambulatory Visit: Payer: Self-pay

## 2022-12-03 ENCOUNTER — Inpatient Hospital Stay: Payer: Medicare HMO | Admitting: Medical Oncology

## 2022-12-03 ENCOUNTER — Encounter: Payer: Self-pay | Admitting: General Practice

## 2022-12-03 ENCOUNTER — Inpatient Hospital Stay: Payer: Medicare HMO

## 2022-12-03 ENCOUNTER — Encounter: Payer: Self-pay | Admitting: Medical Oncology

## 2022-12-03 VITALS — BP 125/43 | HR 72 | Temp 98.3°F | Resp 16 | Ht 61.0 in | Wt 149.0 lb

## 2022-12-03 DIAGNOSIS — R948 Abnormal results of function studies of other organs and systems: Secondary | ICD-10-CM | POA: Diagnosis not present

## 2022-12-03 DIAGNOSIS — C50912 Malignant neoplasm of unspecified site of left female breast: Secondary | ICD-10-CM | POA: Diagnosis not present

## 2022-12-03 DIAGNOSIS — F4321 Adjustment disorder with depressed mood: Secondary | ICD-10-CM | POA: Diagnosis not present

## 2022-12-03 DIAGNOSIS — M8580 Other specified disorders of bone density and structure, unspecified site: Secondary | ICD-10-CM | POA: Insufficient documentation

## 2022-12-03 DIAGNOSIS — G4700 Insomnia, unspecified: Secondary | ICD-10-CM | POA: Diagnosis not present

## 2022-12-03 DIAGNOSIS — Z79811 Long term (current) use of aromatase inhibitors: Secondary | ICD-10-CM | POA: Insufficient documentation

## 2022-12-03 DIAGNOSIS — Z170 Estrogen receptor positive status [ER+]: Secondary | ICD-10-CM | POA: Diagnosis not present

## 2022-12-03 DIAGNOSIS — C50012 Malignant neoplasm of nipple and areola, left female breast: Secondary | ICD-10-CM | POA: Diagnosis not present

## 2022-12-03 DIAGNOSIS — R748 Abnormal levels of other serum enzymes: Secondary | ICD-10-CM | POA: Diagnosis not present

## 2022-12-03 DIAGNOSIS — C50312 Malignant neoplasm of lower-inner quadrant of left female breast: Secondary | ICD-10-CM

## 2022-12-03 DIAGNOSIS — D563 Thalassemia minor: Secondary | ICD-10-CM | POA: Insufficient documentation

## 2022-12-03 LAB — CBC WITH DIFFERENTIAL (CANCER CENTER ONLY)
Abs Immature Granulocytes: 0.02 10*3/uL (ref 0.00–0.07)
Basophils Absolute: 0.1 10*3/uL (ref 0.0–0.1)
Basophils Relative: 1 %
Eosinophils Absolute: 0.4 10*3/uL (ref 0.0–0.5)
Eosinophils Relative: 5 %
HCT: 40.5 % (ref 36.0–46.0)
Hemoglobin: 12.4 g/dL (ref 12.0–15.0)
Immature Granulocytes: 0 %
Lymphocytes Relative: 21 %
Lymphs Abs: 1.7 10*3/uL (ref 0.7–4.0)
MCH: 22.3 pg — ABNORMAL LOW (ref 26.0–34.0)
MCHC: 30.6 g/dL (ref 30.0–36.0)
MCV: 72.8 fL — ABNORMAL LOW (ref 80.0–100.0)
Monocytes Absolute: 0.8 10*3/uL (ref 0.1–1.0)
Monocytes Relative: 10 %
Neutro Abs: 5.1 10*3/uL (ref 1.7–7.7)
Neutrophils Relative %: 63 %
Platelet Count: 271 10*3/uL (ref 150–400)
RBC: 5.56 MIL/uL — ABNORMAL HIGH (ref 3.87–5.11)
RDW: 16.5 % — ABNORMAL HIGH (ref 11.5–15.5)
WBC Count: 8 10*3/uL (ref 4.0–10.5)
nRBC: 0 % (ref 0.0–0.2)

## 2022-12-03 LAB — CMP (CANCER CENTER ONLY)
ALT: 6 U/L (ref 0–44)
AST: 13 U/L — ABNORMAL LOW (ref 15–41)
Albumin: 4.2 g/dL (ref 3.5–5.0)
Alkaline Phosphatase: 180 U/L — ABNORMAL HIGH (ref 38–126)
Anion gap: 8 (ref 5–15)
BUN: 12 mg/dL (ref 8–23)
CO2: 27 mmol/L (ref 22–32)
Calcium: 9.9 mg/dL (ref 8.9–10.3)
Chloride: 105 mmol/L (ref 98–111)
Creatinine: 0.79 mg/dL (ref 0.44–1.00)
GFR, Estimated: >60 mL/min (ref >60)
Glucose, Bld: 132 mg/dL — ABNORMAL HIGH (ref 70–99)
Potassium: 3.8 mmol/L (ref 3.5–5.1)
Sodium: 140 mmol/L (ref 135–145)
Total Bilirubin: 0.5 mg/dL (ref 0.3–1.2)
Total Protein: 7.3 g/dL (ref 6.5–8.1)

## 2022-12-03 LAB — LACTATE DEHYDROGENASE: LDH: 134 U/L (ref 98–192)

## 2022-12-03 MED ORDER — DENOSUMAB 60 MG/ML ~~LOC~~ SOSY
60.0000 mg | PREFILLED_SYRINGE | Freq: Once | SUBCUTANEOUS | Status: AC
  Administered 2022-12-03: 60 mg via SUBCUTANEOUS
  Filled 2022-12-03: qty 1

## 2022-12-03 MED ORDER — ALPRAZOLAM 0.25 MG PO TABS: 0.2500 mg | ORAL_TABLET | Freq: Every evening | ORAL | 0 refills | Status: AC | PRN

## 2022-12-03 NOTE — Progress Notes (Signed)
[facility] Spiritual Care Note  Received referral from [facility] chaplain who supported Bonnie Reed during the death of her sister, because Bonnie Reed herself is also an oncology patient.  Reached out to Bonnie Reed by phone, introducing Spiritual Care as part of her support team. Provided empathic listening and emotional support as she shared about how she is coping with her grief. She and her sister lived together "and were together 24/7," so this loss is not just of a lifelong companion, but also a change in lifestyle/everyday routine.  Provided empathic listening, normalization of feelings, and bereavement support. Bonnie Reed has direct Spiritual Care number and plans to follow up as needed/desired.   [address] [place], [place], [facility] Pager [phone] Voicemail [phone]

## 2022-12-03 NOTE — Progress Notes (Signed)
Hematology and Oncology Follow Up Visit  Bonnie Reed [id number] [birth year] 77 y.o. 12/03/2022   Principle Diagnosis:  Invasive ductal carcinoma of the LEFT breast -- Stage 1 ([id number]) --ER+/HER2- -- Oncotype = 3 Alpha thalassemia minor trait  Current Therapy:  S/P LEFT MRM on 08/25/2021  Folic acid 1 mg PO daily Femara 2.5 mg p.o. daily -start on 10/03/2021 Prolia 60 mg IM q. 12 months --next dose on 12/04/2023   Interim History:  Bonnie Reed is here today for follow-up.    She reports that she is holding it together. Her sister who is her best friend and who lived with her just passed away. This has been really hard on patient. She is not sleeping due to this- has tried trazodone without improvement. No SI/HI.   Otherwise, she is feeling okay.  She has had no problems with nausea or vomiting.  She has had no problems with cough or shortness of breath.   No dental concerns, infections or upcoming procedures.   She has had elevated alkaline phosphatase. She finally had her bone scan performed on 10/21/2022 as shown below. She has not had any follow up imaging since.   10/20/2021 IMPRESSION: 1. Asymmetrically increased radiotracer uptake within the RIGHT pelvis corresponds to mottled lucency and sclerosis with coarsened trabeculation on prior CT in April 2024. This is most consistent with Paget's disease although metastatic disease does remain in the differential. Recommend attention on follow-up. 2. Focal increased radiotracer uptake within the RIGHT proximal femoral shaft. Recommend dedicated radiographs and potentially cross-sectional imaging for further evaluation. 3. Focal increased radiotracer uptake within the calvarium, nonspecific. Recommend further evaluation with dedicated head CT or MRI.  Currently, I would have said that her performance status is probably ECOG 1.  Wt Readings from Last 3 Encounters:  10/15/22 149 lb (67.6 kg)  09/22/22 149 lb (67.6 kg)   09/07/22 152 lb (68.9 kg)     Medications:  Allergies as of 12/03/2022   No Active Allergies      Medication List        Accurate as of December 03, 2022  9:33 AM. If you have any questions, ask your nurse or doctor.          Accu-Chek Softclix Lancets lancets Use to check blood sugar three times daily or as directed. What changed:  how much to take how to take this when to take this   anastrozole 1 MG tablet Commonly known as: ARIMIDEX Take 1 tablet (1 mg total) by mouth daily.   aspirin EC 81 MG tablet Take 81 mg by mouth in the morning.   atorvastatin 40 MG tablet Commonly known as: LIPITOR Take 1 tablet (40 mg total) by mouth daily.   BD Pen Needle Nano 2nd Gen 32G X 4 MM Misc Generic drug: Insulin Pen Needle USE AS DIRECTED THREE TIMES DAILY   cetirizine 10 MG tablet Commonly known as: ZYRTEC Take 10 mg by mouth daily as needed for allergies.   clobetasol ointment 0.05 % Commonly known as: TEMOVATE Apply 1 Application topically as needed.   esomeprazole 40 MG capsule Commonly known as: NexIUM Take 1 capsule (40 mg total) by mouth daily.   fluconazole 150 MG tablet Commonly known as: DIFLUCAN Take 1 tablet after finishing the antibiotic and 1 tablet 3 days later if needed   fluticasone 50 MCG/ACT nasal spray Commonly known as: FLONASE Place 2 sprays into both nostrils daily as needed for allergies. What changed: when to take this  folic acid 1 MG tablet Commonly known as: FOLVITE Take 1 tablet (1 mg total) by mouth daily.   FreeStyle Libre 3 Reader Devi 1 each by Does not apply route every 14 (fourteen) days.   GENTEAL OP Place 1 drop into both eyes daily as needed (dry eyes).   GlucoCom Blood Glucose Monitor Devi 1 each by Other route daily.   ibuprofen 200 MG tablet Commonly known as: ADVIL Take 400 mg by mouth every 8 (eight) hours as needed (pain.).   Jardiance 10 MG Tabs tablet Generic drug: empagliflozin Take 1 tablet by  mouth once daily   Lantus SoloStar 100 UNIT/ML Solostar Pen Generic drug: insulin glargine Inject 20 Units into the skin 2 (two) times daily.   letrozole 2.5 MG tablet Commonly known as: FEMARA Take 1 tablet by mouth once daily   Linzess 145 MCG Caps capsule Generic drug: linaclotide TAKE 1 CAPSULE BY MOUTH ONCE DAILY AS NEEDED FOR CONSTIPATION   nitroGLYCERIN 0.4 MG SL tablet Commonly known as: NITROSTAT Place 1 tablet (0.4 mg total) under the tongue every 5 (five) minutes as needed for chest pain.   ONE TOUCH DELICA LANCING DEV Misc Use to test 3 times a day Dx:E11.9 What changed:  how much to take how to take this when to take this   oxyCODONE 5 MG immediate release tablet Commonly known as: Roxicodone Take 1 tablet (5 mg total) by mouth every 6 (six) hours as needed for severe pain.   Ozempic (2 MG/DOSE) 8 MG/3ML Sopn Generic drug: Semaglutide (2 MG/DOSE) INJECT 2 MG INTO THE SKIN ONCE A WEEK   Prolia 60 MG/ML Sosy injection Generic drug: denosumab Inject 60 mg into the skin every 6 (six) months.   traMADol 50 MG tablet Commonly known as: ULTRAM Take 1 tablet (50 mg total) by mouth every 6 (six) hours as needed.   traZODone 50 MG tablet Commonly known as: DESYREL TAKE 1/2 TO 1 (ONE-HALF TO ONE) TABLET BY MOUTH AT BEDTIME AS NEEDED FOR SLEEP   valsartan 80 MG tablet Commonly known as: DIOVAN Take 1 tablet (80 mg total) by mouth daily.   Vitamin D (Ergocalciferol) 1.25 MG (50000 UNIT) Caps capsule Commonly known as: DRISDOL Take 50,000 Units by mouth every Monday.        Allergies:  No Active Allergies  Past Medical History, Surgical history, Social history, and Family History were reviewed and updated.  Review of Systems: Review of Systems  Constitutional: Negative.   HENT: Negative.  Negative for hearing loss.   Eyes: Negative.   Respiratory: Negative.    Cardiovascular: Negative.   Gastrointestinal: Negative.   Genitourinary: Negative.    Musculoskeletal: Negative.   Skin: Negative.   Neurological: Negative.   Endo/Heme/Allergies: Negative.   Psychiatric/Behavioral: Negative.     Physical Exam: Vitals:   12/03/22 0950  BP: (!) 125/43  Pulse: 72  Resp: 16  Temp: 98.3 F (36.8 C)  SpO2: 99%     Wt Readings from Last 3 Encounters:  10/15/22 149 lb (67.6 kg)  09/22/22 149 lb (67.6 kg)  09/07/22 152 lb (68.9 kg)   Physical Exam Vitals reviewed.  Constitutional:      Comments: Deferred by patient  HENT:     Head: Normocephalic and atraumatic.  Eyes:     Pupils: Pupils are equal, round, and reactive to light.  Cardiovascular:     Rate and Rhythm: Normal rate and regular rhythm.     Heart sounds: Normal heart sounds.  Comments: Cardiac exam shows regular rate and rhythm with normal S1 and S2.  I do not hear any murmurs, rubs or bruits. Pulmonary:     Effort: Pulmonary effort is normal.     Breath sounds: Normal breath sounds.  Abdominal:     General: Bowel sounds are normal.     Palpations: Abdomen is soft.  Musculoskeletal:        General: No tenderness or deformity. Normal range of motion.     Cervical back: Normal range of motion.     Comments: Extremity shows some mild chronic swelling in the left arm.  There is no swelling in the legs.  Lymphadenopathy:     Cervical: No cervical adenopathy.  Skin:    General: Skin is warm and dry.     Findings: No erythema or rash.  Neurological:     Mental Status: She is alert and oriented to person, place, and time.  Psychiatric:        Behavior: Behavior normal.        Thought Content: Thought content normal.        Judgment: Judgment normal.      Lab Results  Component Value Date   WBC 8.0 12/03/2022   HGB 12.4 12/03/2022   HCT 40.5 12/03/2022   MCV 72.8 (L) 12/03/2022   PLT 271 12/03/2022   Lab Results  Component Value Date   FERRITIN 118 09/24/2021   IRON 85 09/24/2021   TIBC 417 09/24/2021   UIBC 332 09/24/2021   IRONPCTSAT 20  09/24/2021   Lab Results  Component Value Date   RETICCTPCT 1.9 09/24/2021   RBC 5.56 (H) 12/03/2022   No results found for: "KPAFRELGTCHN", "LAMBDASER", "KAPLAMBRATIO" No results found for: "IGGSERUM", "IGA", "IGMSERUM" No results found for: "TOTALPROTELP", "ALBUMINELP", "A1GS", "A2GS", "BETS", "BETA2SER", "GAMS", "MSPIKE", "SPEI"   Chemistry      Component Value Date/Time   NA 142 10/15/2022 0907   NA 141 02/08/2019 1049   K 4.1 10/15/2022 0907   CL 106 10/15/2022 0907   CO2 28 10/15/2022 0907   BUN 14 10/15/2022 0907   BUN 15 02/08/2019 1049   CREATININE 0.80 10/15/2022 0907      Component Value Date/Time   CALCIUM 10.5 (H) 10/15/2022 0907   ALKPHOS 172 (H) 10/15/2022 0907   AST 13 (L) 10/15/2022 0907   ALT 9 10/15/2022 0907   BILITOT 0.4 10/15/2022 0907      Impression and Plan: Ms. Minich is a very pleasant 77 yo African American female with alpha thalassemia minor trait.  She has remote history of breast cancer of the left breast.  She now has another breast cancer.  Thankfully, this is early stage.  Even more important is the fact that it is estrogen positive and HER2 negative.  There is a very low Oncotype score.  I would suggest a PET scan/MR given her bone scan results. She is agreeable. Orders placed and need to be scheduled.   In terms of her grief and insomnia I am concerned that she is not sleeping. Since Trazodone has not been effective and she has tolerated xanax well previously I have suggested xanax PRN. Discussed black box warnings and precautions. She will schedule with her PCP to discuss further and consider a counselor.   Disposition Prolia today PET/MR order placed  RTC MD, labs(CBC w/, CMP. LDH) in 6 weeks-Rosa    Rushie Chestnut, PA-C 9/12/20249:33 AM

## 2022-12-03 NOTE — Patient Instructions (Signed)
Denosumab Injection (Osteoporosis) What is this medication? DENOSUMAB (den oh SUE mab) prevents and treats osteoporosis. It works by making your bones stronger and less likely to break (fracture). It is a monoclonal antibody. This medicine may be used for other purposes; ask your health care provider or pharmacist if you have questions. COMMON BRAND NAME(S): Prolia What should I tell my care team before I take this medication? They need to know if you have any of these conditions: Dental or gum disease Had thyroid or parathyroid (glands located in neck) surgery Having dental surgery or a tooth pulled Kidney disease Low levels of calcium in the blood On dialysis Poor nutrition Thyroid disease Trouble absorbing nutrients from your food An unusual or allergic reaction to denosumab, other medications, foods, dyes, or preservatives Pregnant or trying to get pregnant Breastfeeding How should I use this medication? This medication is injected under the skin. It is given by your care team in a hospital or clinic setting. A special MedGuide will be given to you before each treatment. Be sure to read this information carefully each time. Talk to your care team about the use of this medication in children. Special care may be needed. Overdosage: If you think you have taken too much of this medicine contact a poison control center or emergency room at once. NOTE: This medicine is only for you. Do not share this medicine with others. What if I miss a dose? Keep appointments for follow-up doses. It is important not to miss your dose. Call your care team if you are unable to keep an appointment. What may interact with this medication? Do not take this medication with any of the following: Other medications that contain denosumab This medication may also interact with the following: Medications that lower your chance of fighting infection Steroid medications, such as prednisone or cortisone This  list may not describe all possible interactions. Give your health care provider a list of all the medicines, herbs, non-prescription drugs, or dietary supplements you use. Also tell them if you smoke, drink alcohol, or use illegal drugs. Some items may interact with your medicine. What should I watch for while using this medication? Your condition will be monitored carefully while you are receiving this medication. You may need blood work done while taking this medication. This medication may increase your risk of getting an infection. Call your care team for advice if you get a fever, chills, sore throat, or other symptoms of a cold or flu. Do not treat yourself. Try to avoid being around people who are sick. Tell your dentist and dental surgeon that you are taking this medication. You should not have major dental surgery while on this medication. See your dentist to have a dental exam and fix any dental problems before starting this medication. Take good care of your teeth while on this medication. Make sure you see your dentist for regular follow-up appointments. This medication may cause low levels of calcium in your body. The risk of severe side effects is increased in people with kidney disease. Your care team may prescribe calcium and vitamin D to help prevent low calcium levels while you take this medication. It is important to take calcium and vitamin D as directed by your care team. Talk to your care team if you may be pregnant. Serious birth defects may occur if you take this medication during pregnancy and for 5 months after the last dose. You will need a negative pregnancy test before starting this medication. Contraception   is recommended while taking this medication and for 5 months after the last dose. Your care team can help you find the option that works for you. Talk to your care team before breastfeeding. Changes to your treatment plan may be needed. What side effects may I notice from  receiving this medication? Side effects that you should report to your care team as soon as possible: Allergic reactions--skin rash, itching, hives, swelling of the face, lips, tongue, or throat Infection--fever, chills, cough, sore throat, wounds that don't heal, pain or trouble when passing urine, general feeling of discomfort or being unwell Low calcium level--muscle pain or cramps, confusion, tingling, or numbness in the hands or feet Osteonecrosis of the jaw--pain, swelling, or redness in the mouth, numbness of the jaw, poor healing after dental work, unusual discharge from the mouth, visible bones in the mouth Severe bone, joint, or muscle pain Skin infection--skin redness, swelling, warmth, or pain Side effects that usually do not require medical attention (report these to your care team if they continue or are bothersome): Back pain Headache Joint pain Muscle pain Pain in the hands, arms, legs, or feet Runny or stuffy nose Sore throat This list may not describe all possible side effects. Call your doctor for medical advice about side effects. You may report side effects to FDA at 1-800-FDA-1088. Where should I keep my medication? This medication is given in a hospital or clinic. It will not be stored at home. NOTE: This sheet is a summary. It may not cover all possible information. If you have questions about this medicine, talk to your doctor, pharmacist, or health care provider.  2024 [employer]vier/Gold Standard (2022-04-14 00:00:00)  

## 2022-12-04 ENCOUNTER — Encounter: Payer: Self-pay | Admitting: Hematology & Oncology

## 2022-12-07 ENCOUNTER — Other Ambulatory Visit: Payer: Self-pay | Admitting: *Deleted

## 2022-12-07 ENCOUNTER — Other Ambulatory Visit: Payer: Self-pay | Admitting: Nurse Practitioner

## 2022-12-07 DIAGNOSIS — R948 Abnormal results of function studies of other organs and systems: Secondary | ICD-10-CM

## 2022-12-07 DIAGNOSIS — R748 Abnormal levels of other serum enzymes: Secondary | ICD-10-CM

## 2022-12-11 ENCOUNTER — Other Ambulatory Visit: Payer: Self-pay

## 2022-12-11 MED ORDER — OZEMPIC (2 MG/DOSE) 8 MG/3ML ~~LOC~~ SOPN: 2.0000 mg | PEN_INJECTOR | SUBCUTANEOUS | 0 refills | Status: DC

## 2022-12-11 NOTE — Telephone Encounter (Signed)
Requesting: Ozempic (2mg /Dose) 8mg /50ml Qty>3 Last Visit: 08/24/2022 Next Visit: 04/23/2023 Last Refill: 10/22/2022  Please Advise

## 2022-12-12 ENCOUNTER — Other Ambulatory Visit: Payer: Self-pay | Admitting: Nurse Practitioner

## 2022-12-13 ENCOUNTER — Other Ambulatory Visit: Payer: Self-pay | Admitting: Nurse Practitioner

## 2022-12-14 ENCOUNTER — Encounter (HOSPITAL_COMMUNITY)
Admission: RE | Admit: 2022-12-14 | Discharge: 2022-12-14 | Disposition: A | Discharge status: Home / Self Care | Payer: Medicare HMO | Source: Ambulatory Visit | Attending: Medical Oncology | Admitting: Medical Oncology

## 2022-12-14 ENCOUNTER — Other Ambulatory Visit: Payer: Self-pay | Admitting: Nurse Practitioner

## 2022-12-14 DIAGNOSIS — R748 Abnormal levels of other serum enzymes: Secondary | ICD-10-CM | POA: Insufficient documentation

## 2022-12-14 DIAGNOSIS — R948 Abnormal results of function studies of other organs and systems: Secondary | ICD-10-CM | POA: Insufficient documentation

## 2022-12-14 DIAGNOSIS — C50919 Malignant neoplasm of unspecified site of unspecified female breast: Secondary | ICD-10-CM | POA: Diagnosis not present

## 2022-12-14 LAB — GLUCOSE, CAPILLARY: Glucose-Capillary: 111 mg/dL — ABNORMAL HIGH (ref 70–99)

## 2022-12-14 MED ORDER — FLUDEOXYGLUCOSE F - 18 (FDG) INJECTION
7.5000 | Freq: Once | INTRAVENOUS | Status: AC | PRN
  Administered 2022-12-14: 7.33 via INTRAVENOUS

## 2022-12-14 MED ORDER — OZEMPIC (2 MG/DOSE) 8 MG/3ML ~~LOC~~ SOPN: 2.0000 mg | PEN_INJECTOR | SUBCUTANEOUS | 1 refills | Status: DC

## 2022-12-17 DIAGNOSIS — M5412 Radiculopathy, cervical region: Secondary | ICD-10-CM | POA: Diagnosis not present

## 2022-12-21 ENCOUNTER — Ambulatory Visit (HOSPITAL_COMMUNITY): Admission: RE | Admit: 2022-12-21 | Payer: Medicare HMO | Source: Ambulatory Visit

## 2022-12-24 ENCOUNTER — Other Ambulatory Visit: Payer: Self-pay | Admitting: Nurse Practitioner

## 2022-12-24 DIAGNOSIS — E1122 Type 2 diabetes mellitus with diabetic chronic kidney disease: Secondary | ICD-10-CM

## 2022-12-29 NOTE — Telephone Encounter (Signed)
Requesting: OneTouch Verio In Vitro Strip  Last Visit: 08/24/2022 Next Visit: 04/23/2023 Last Refill: 06/09/2022 Included in Kit Please Advise

## 2022-12-30 ENCOUNTER — Ambulatory Visit (HOSPITAL_COMMUNITY)
Admission: RE | Admit: 2022-12-30 | Discharge: 2022-12-30 | Disposition: A | Discharge status: Home / Self Care | Payer: Medicare HMO | Source: Ambulatory Visit | Attending: Medical Oncology | Admitting: Medical Oncology

## 2022-12-30 ENCOUNTER — Other Ambulatory Visit: Payer: Self-pay | Admitting: Nurse Practitioner

## 2022-12-30 DIAGNOSIS — R937 Abnormal findings on diagnostic imaging of other parts of musculoskeletal system: Secondary | ICD-10-CM | POA: Diagnosis not present

## 2022-12-30 DIAGNOSIS — Z170 Estrogen receptor positive status [ER+]: Secondary | ICD-10-CM | POA: Insufficient documentation

## 2022-12-30 DIAGNOSIS — R930 Abnormal findings on diagnostic imaging of skull and head, not elsewhere classified: Secondary | ICD-10-CM | POA: Diagnosis not present

## 2022-12-30 DIAGNOSIS — C50012 Malignant neoplasm of nipple and areola, left female breast: Secondary | ICD-10-CM | POA: Diagnosis not present

## 2022-12-30 DIAGNOSIS — R748 Abnormal levels of other serum enzymes: Secondary | ICD-10-CM | POA: Diagnosis not present

## 2022-12-30 DIAGNOSIS — R948 Abnormal results of function studies of other organs and systems: Secondary | ICD-10-CM | POA: Insufficient documentation

## 2022-12-30 MED ORDER — GADOBUTROL 1 MMOL/ML IV SOLN
7.0000 mL | Freq: Once | INTRAVENOUS | Status: AC | PRN
  Administered 2022-12-30: 7 mL via INTRAVENOUS

## 2022-12-31 ENCOUNTER — Other Ambulatory Visit: Payer: Self-pay | Admitting: Nurse Practitioner

## 2022-12-31 DIAGNOSIS — E118 Type 2 diabetes mellitus with unspecified complications: Secondary | ICD-10-CM

## 2022-12-31 DIAGNOSIS — M5412 Radiculopathy, cervical region: Secondary | ICD-10-CM | POA: Diagnosis not present

## 2022-12-31 NOTE — Telephone Encounter (Signed)
Requesting: Jardiance 10 MG Oral Tablet  Last Visit: 06/09/2022 Next Visit: 04/23/2023 Last Refill: 10/05/2022  Please Advise

## 2022-12-31 NOTE — Telephone Encounter (Signed)
Requesting: Valsartan 80 MG Oral Tablet  Last Visit: 06/09/2022 Next Visit: 04/23/2023 Last Refill: 07/14/2022  Please Advise

## 2023-01-06 ENCOUNTER — Encounter: Payer: Self-pay | Admitting: Cardiology

## 2023-01-06 ENCOUNTER — Ambulatory Visit: Payer: Medicare HMO | Attending: Cardiology | Admitting: Cardiology

## 2023-01-06 VITALS — BP 162/80 | HR 66 | Ht 61.0 in | Wt 153.0 lb

## 2023-01-06 DIAGNOSIS — E782 Mixed hyperlipidemia: Secondary | ICD-10-CM | POA: Diagnosis not present

## 2023-01-06 DIAGNOSIS — I2510 Atherosclerotic heart disease of native coronary artery without angina pectoris: Secondary | ICD-10-CM

## 2023-01-06 DIAGNOSIS — I509 Heart failure, unspecified: Secondary | ICD-10-CM | POA: Diagnosis not present

## 2023-01-06 DIAGNOSIS — R072 Precordial pain: Secondary | ICD-10-CM

## 2023-01-06 DIAGNOSIS — E118 Type 2 diabetes mellitus with unspecified complications: Secondary | ICD-10-CM

## 2023-01-06 DIAGNOSIS — Z794 Long term (current) use of insulin: Secondary | ICD-10-CM | POA: Diagnosis not present

## 2023-01-06 NOTE — Progress Notes (Signed)
Cardiology Office Note:    Date:  01/06/2023   ID:  Bonnie Reed, DOB [birth year], MRN [id number]  PCP:  Gerre Scull, NP  Cardiologist:  Gypsy Balsam, MD    Referring MD: Gerre Scull, NP   Chief Complaint  Patient presents with   Follow-up    Patient has question about family h/o Sister past away of MI    History of Present Illness:    Bonnie Reed is a 77 y.o. female past medical history significant for congestive heart failure last echocardiogram showed preserved left ventricle ejection fraction no signs and symptoms of CHF, additional problem include essential hypertension, diabetes, dyslipidemia.  Comes today to my office for follow-up.  Recently she lost her sister Marva Panda breast friend still grieving after that) and had acute heart attack and died almost in front of her.  Obviously very shaken by day.  Patient complain of having some chest sensation and she did have MRI of the spine done and she was told this is related to his spine however sensation is located on the left side of chest and happen sometimes when she exercise which make me concerned  Past Medical History:  Diagnosis Date   Allergy    Anemia    during knee replacement 15 years ago   Blood transfusion without reported diagnosis    Breast cancer (HCC) 1997   Left Breast Cancer   Cancer (HCC)    Cervical spondylosis    CHF (congestive heart failure) (HCC)    Coronary artery disease involving native coronary artery of native heart without angina pectoris    COVID 04/2021   mild case   Diabetes mellitus without complication (HCC)    Diverticulosis    GERD (gastroesophageal reflux disease)    without esophagitis   History of breast cancer    History of kidney stones    Hyperlipidemia    Hypertension    Lumbar spondylosis    Osteoarthritis    right knee   Osteopenia due to cancer therapy 09/30/2021   Other eczema    Personal history of radiation therapy 1997   Left Breast Cancer    Recurrent major depressive disorder (HCC)    Thyroid goiter     Past Surgical History:  Procedure Laterality Date   APPENDECTOMY     BREAST BIOPSY Right 08/01/2021   BREAST LUMPECTOMY Left 1997   CESAREAN SECTION     CHOLECYSTECTOMY     EVACUATION BREAST HEMATOMA Left 07/10/2022   Procedure: DRAIN PLACEMENT LEFT MASTECTOMY CAVITY;  Surgeon: Griselda Miner, MD;  Location: [facility];  Service: General;  Laterality: Left;   HEMATOMA EVACUATION Left 03/12/2022   Procedure: PLACEMENT OF DRAIN LEFT CHEST WALL;  Surgeon: Griselda Miner, MD;  Location: [facility];  Service: General;  Laterality: Left;   KNEE ARTHROSCOPY Right    LESION REMOVAL Left 08/25/2021   Procedure: EXCISION SKIN LESION LEFT CHEST WALL;  Surgeon: Griselda Miner, MD;  Location: [facility];  Service: General;  Laterality: Left;   REDUCTION MAMMAPLASTY Right 1998   SIMPLE MASTECTOMY WITH AXILLARY SENTINEL NODE BIOPSY Left 08/25/2021   Procedure: LEFT MASTECTOMY;  Surgeon: Griselda Miner, MD;  Location: [facility];  Service: General;  Laterality: Left;   TOTAL HIP ARTHROPLASTY Right    TUBAL LIGATION      Current Medications: Current Meds  Medication Sig   ACCU-CHEK SOFTCLIX LANCETS lancets Use to check blood sugar three times daily or as directed. (Patient taking differently:  1 each by Other route See admin instructions. Use to check blood sugar three times daily or as directed.)   anastrozole (ARIMIDEX) 1 MG tablet Take 1 tablet (1 mg total) by mouth daily.   aspirin EC 81 MG tablet Take 81 mg by mouth in the morning.   atorvastatin (LIPITOR) 40 MG tablet Take 1 tablet (40 mg total) by mouth daily.   BD PEN NEEDLE NANO 2ND GEN 32G X 4 MM MISC USE AS DIRECTED THREE TIMES DAILY (Patient taking differently: 1 each by Other route 3 (three) times daily. as directed)   Blood Glucose Monitoring Suppl (GLUCOCOM BLOOD GLUCOSE MONITOR) DEVI 1 each by Other route daily.   Carboxymethylcell-Hypromellose (GENTEAL OP) Place 1 drop into both eyes daily  as needed (dry eyes).   cetirizine (ZYRTEC) 10 MG tablet Take 10 mg by mouth daily as needed for allergies.   clobetasol ointment (TEMOVATE) 0.05 % Apply 1 Application topically as needed (rash).   Continuous Blood Gluc Receiver (FREESTYLE LIBRE 3 READER) DEVI 1 each by Does not apply route every 14 (fourteen) days.   denosumab (PROLIA) 60 MG/ML SOSY injection Inject 60 mg into the skin every 6 (six) months.   esomeprazole (NEXIUM) 40 MG capsule Take 1 capsule (40 mg total) by mouth daily.   fluconazole (DIFLUCAN) 150 MG tablet Take 1 tablet after finishing the antibiotic and 1 tablet 3 days later if needed (Patient taking differently: Take 150 mg by mouth once. Take 1 tablet after finishing the antibiotic and 1 tablet 3 days later if needed)   fluticasone (FLONASE) 50 MCG/ACT nasal spray Place 2 sprays into both nostrils daily as needed for allergies. (Patient taking differently: Place 2 sprays into both nostrils in the morning and at bedtime.)   folic acid (FOLVITE) 1 MG tablet Take 1 tablet (1 mg total) by mouth daily.   glucose blood (ONETOUCH VERIO) test strip USE AS DIRECTED THREE TIMES DAILY (Patient taking differently: 1 each by Other route as needed for other. USE AS DIRECTED THREE TIMES DAILY)   ibuprofen (ADVIL) 200 MG tablet Take 400 mg by mouth every 8 (eight) hours as needed (pain.).   insulin glargine (LANTUS SOLOSTAR) 100 UNIT/ML Solostar Pen Inject 20 Units into the skin 2 (two) times daily.   JARDIANCE 10 MG TABS tablet Take 1 tablet by mouth once daily   Lancet Devices (ONE TOUCH DELICA LANCING DEV) MISC Use to test 3 times a day Dx:E11.9 (Patient taking differently: 1 each by Other route See admin instructions. Use to test 3 times a day Dx:E11.9)   LINZESS 145 MCG CAPS capsule TAKE 1 CAPSULE BY MOUTH ONCE DAILY AS NEEDED FOR CONSTIPATION (Patient taking differently: Take 145 mcg by mouth daily before breakfast.)   nitroGLYCERIN (NITROSTAT) 0.4 MG SL tablet Place 1 tablet (0.4 mg  total) under the tongue every 5 (five) minutes as needed for chest pain.   Semaglutide, 2 MG/DOSE, (OZEMPIC, 2 MG/DOSE,) 8 MG/3ML SOPN Inject 2 mg into the skin once a week.   traMADol (ULTRAM) 50 MG tablet Take 1 tablet (50 mg total) by mouth every 6 (six) hours as needed. (Patient taking differently: Take 50 mg by mouth every 6 (six) hours as needed for moderate pain (pain score 4-6) or severe pain (pain score 7-10).)   traZODone (DESYREL) 50 MG tablet TAKE 1/2 TO 1 (ONE-HALF TO ONE) TABLET BY MOUTH AT BEDTIME AS NEEDED FOR SLEEP (Patient taking differently: Take 25-50 mg by mouth at bedtime as needed for sleep.)  valsartan (DIOVAN) 80 MG tablet Take 1 tablet by mouth once daily   [DISCONTINUED] HYDROcodone-acetaminophen (NORCO/VICODIN) 5-325 MG tablet Take 1 tablet by mouth 3 (three) times daily.   [DISCONTINUED] oxyCODONE (ROXICODONE) 5 MG immediate release tablet Take 1 tablet (5 mg total) by mouth every 6 (six) hours as needed for severe pain.     Allergies:   Patient has no active allergies.   Social History   Socioeconomic History   Marital status: Divorced    Spouse name: Not on file   Number of children: 4   Years of education: Not on file   Highest education level: Not on file  Occupational History   Occupation: Retired  Tobacco Use   Smoking status: Never   Smokeless tobacco: Never  Vaping Use   Vaping status: Never Used  Substance and Sexual Activity   Alcohol use: No   Drug use: No   Sexual activity: Not Currently  Other Topics Concern   Not on file  Social History Narrative   Not on file   Social Determinants of Health   Financial Resource Strain: Low Risk  (04/20/2022)   Overall Financial Resource Strain (CARDIA)    Difficulty of Paying Living Expenses: Not hard at all  Food Insecurity: Food Insecurity Present (04/20/2022)   Hunger Vital Sign    Worried About Running Out of Food in the Last Year: Often true    Ran Out of Food in the Last Year: Often true   Transportation Needs: No Transportation Needs (04/20/2022)   PRAPARE - Administrator, Civil Service (Medical): No    Lack of Transportation (Non-Medical): No  Physical Activity: Insufficiently Active (04/20/2022)   Exercise Vital Sign    Days of Exercise per Week: 4 days    Minutes of Exercise per Session: 30 min  Stress: No Stress Concern Present (04/20/2022)   [employer] of Occupational Health - Occupational Stress Questionnaire    Feeling of Stress : Not at all  Social Connections: Not on File (10/26/2018)   Received from [place], [place]   Social Connections    Social Connections and Isolation: 0     Family History: The patient's family history includes Cancer in her father; Dementia in her mother; Kidney disease in her mother; Stroke in her mother. There is no history of Colon cancer or Rectal cancer. ROS:   Please see the history of present illness.    All 14 point review of systems negative except as described per history of present illness  EKGs/Labs/Other Studies Reviewed:    EKG Interpretation Date/Time:  Wednesday January 06 2023 13:41:55 EDT Ventricular Rate:  66 PR Interval:  186 QRS Duration:  102 QT Interval:  422 QTC Calculation: 442 R Axis:   -56  Text Interpretation: Normal sinus rhythm Incomplete right bundle branch block Left anterior fascicular block Cannot rule out Anterior infarct , age undetermined Abnormal ECG When compared with ECG of 05-Jul-2022 08:53, No significant change was found Confirmed by Gypsy Balsam [phone]) on 01/06/2023 1:53:41 PM    Recent Labs: 12/03/2022: ALT 6; BUN 12; Creatinine 0.79; Hemoglobin 12.4; Platelet Count 271; Potassium 3.8; Sodium 140  Recent Lipid Panel    Component Value Date/Time   CHOL 143 01/26/2022 1014   CHOL 162 02/08/2019 1049   TRIG 83.0 01/26/2022 1014   HDL 40.00 01/26/2022 1014   HDL 41 02/08/2019 1049   CHOLHDL 4 01/26/2022 1014   VLDL 16.6 01/26/2022 1014   LDLCALC 86 01/26/2022 1014  LDLCALC 103 (H) 02/08/2019 1049    Physical Exam:    VS:  BP (!) 162/80 (BP Location: Left Arm, Patient Position: Sitting)   Pulse 66   Ht 5\' 1"  (1.549 m)   Wt 153 lb (69.4 kg)   LMP 10/17/1991 (Exact Date)   SpO2 96%   BMI 28.91 kg/m     Wt Readings from Last 3 Encounters:  01/06/23 153 lb (69.4 kg)  12/03/22 149 lb (67.6 kg)  10/15/22 149 lb (67.6 kg)     GEN:  Well nourished, well developed in no acute distress HEENT: Normal NECK: No JVD; No carotid bruits LYMPHATICS: No lymphadenopathy CARDIAC: RRR, no murmurs, no rubs, no gallops RESPIRATORY:  Clear to auscultation without rales, wheezing or rhonchi  ABDOMEN: Soft, non-tender, non-distended MUSCULOSKELETAL:  No edema; No deformity  SKIN: Warm and dry LOWER EXTREMITIES: no swelling NEUROLOGIC:  Alert and oriented x 3 PSYCHIATRIC:  Normal affect   ASSESSMENT:    1. Precordial pain   2. Coronary artery disease involving native coronary artery of native heart without angina pectoris   3. Congestive heart failure, unspecified HF chronicity, unspecified heart failure type (HCC)   4. Type 2 diabetes mellitus with complication, with long-term current use of insulin (HCC)   5. Mixed hyperlipidemia    PLAN:    In order of problems listed above:  Precordial chest pain will schedule her to have a stress test will be Lexiscan. Congestive heart failure compensated does not have any evidence of diminished ejection fraction based on last echocardiogram. Diabetes I did review K PN which show me her hemoglobin A1c 5.6.  Well-controlled. Mixed dyslipidemia I did review K PN show me LDL 86 however this is from last year.  Will schedule her to have fasting lipid profile done   Medication Adjustments/Labs and Tests Ordered: Current medicines are reviewed at length with the patient today.  Concerns regarding medicines are outlined above.  Orders Placed This Encounter  Procedures   EKG 12-Lead   Medication changes: No orders  of the defined types were placed in this encounter.   Signed, Georgeanna Lea, MD, [facility] 01/06/2023 2:03 PM    [facility]

## 2023-01-06 NOTE — Patient Instructions (Signed)
Medication Instructions:  Your physician recommends that you continue on your current medications as directed. Please refer to the Current Medication list given to you today.  *If you need a refill on your cardiac medications before your next appointment, please call your pharmacy*   Lab Work: Your physician recommends that you return for lab work in: Next few days to get a fasting Lipid Panel Lab opens at 8am. You DO NOT NEED an appointment. Best time to come is between 8am and 12noon and between 1:30 and 4:30. If you have been asked to fast for your blood work please have nothing to eat or drink after midnight. You may have water.   If you have labs (blood work) drawn today and your tests are completely normal, you will receive your results only by: MyChart Message (if you have MyChart) OR A paper copy in the mail If you have any lab test that is abnormal or we need to change your treatment, we will call you to review the results.   Testing/Procedures: Your physician has requested that you have a lexiscan myoview. For further information please visit [email or web address] Please follow instruction sheet, as given.   The test will take approximately 3 to 4 hours to complete; you may bring reading material.  If someone comes with you to your appointment, they will need to remain in the main lobby due to limited space in the testing area. How to prepare for your Myocardial Perfusion Test:             Do not eat or drink 3 hours prior to your test, except you may have water. Do not consume products containing caffeine (regular or decaffeinated) 12 hours prior to your test. (ex: coffee, chocolate, sodas, tea). Do bring a list of your current medications with you.  If not listed below, you may take your medications as normal. Do wear comfortable clothes (no dresses or overalls) and walking shoes, tennis shoes preferred (No heels or open toe shoes are allowed). Do NOT wear cologne, perfume,  aftershave, or lotions (deodorant is allowed). If these instructions are not followed, your test will have to be rescheduled.    Follow-Up: At [facility], you and your health needs are our priority.  As part of our continuing mission to provide you with exceptional heart care, we have created designated Provider Care Teams.  These Care Teams include your primary Cardiologist (physician) and Advanced Practice Providers (APPs -  Physician Assistants and Nurse Practitioners) who all work together to provide you with the care you need, when you need it.  We recommend signing up for the patient portal called "MyChart".  Sign up information is provided on this After Visit Summary.  MyChart is used to connect with patients for Virtual Visits (Telemedicine).  Patients are able to view lab/test results, encounter notes, upcoming appointments, etc.  Non-urgent messages can be sent to your provider as well.   To learn more about what you can do with MyChart, go to [email or web address].    Your next appointment:   6 month(s)  Provider:   Gypsy Balsam, MD    Other Instructions

## 2023-01-06 NOTE — Addendum Note (Signed)
Addended by: Roosvelt Harps R on: 01/06/2023 02:10 PM   Modules accepted: Orders

## 2023-01-07 ENCOUNTER — Encounter (HOSPITAL_COMMUNITY): Payer: Self-pay

## 2023-01-12 ENCOUNTER — Ambulatory Visit (INDEPENDENT_AMBULATORY_CARE_PROVIDER_SITE_OTHER): Payer: Medicare HMO | Admitting: Family Medicine

## 2023-01-12 ENCOUNTER — Ambulatory Visit: Payer: Medicare HMO | Attending: Cardiology

## 2023-01-12 ENCOUNTER — Encounter: Payer: Self-pay | Admitting: Family Medicine

## 2023-01-12 VITALS — BP 126/68 | HR 83 | Temp 97.8°F | Ht 61.0 in | Wt 151.0 lb

## 2023-01-12 DIAGNOSIS — K5792 Diverticulitis of intestine, part unspecified, without perforation or abscess without bleeding: Secondary | ICD-10-CM

## 2023-01-12 DIAGNOSIS — R072 Precordial pain: Secondary | ICD-10-CM

## 2023-01-12 HISTORY — DX: Diverticulitis of intestine, part unspecified, without perforation or abscess without bleeding: K57.92

## 2023-01-12 LAB — POCT URINALYSIS DIPSTICK
Bilirubin, UA: NEGATIVE
Blood, UA: NEGATIVE
Glucose, UA: NEGATIVE
Ketones, UA: NEGATIVE
Leukocytes, UA: NEGATIVE
Nitrite, UA: NEGATIVE
Protein, UA: NEGATIVE
Spec Grav, UA: 1.015 (ref 1.010–1.025)
Urobilinogen, UA: 0.2 U/dL — AB
pH, UA: 6 (ref 5.0–8.0)

## 2023-01-12 MED ORDER — REGADENOSON 0.4 MG/5ML IV SOLN
0.4000 mg | Freq: Once | INTRAVENOUS | Status: AC
  Administered 2023-01-12: 0.4 mg via INTRAVENOUS

## 2023-01-12 MED ORDER — TECHNETIUM TC 99M TETROFOSMIN IV KIT
7.9000 | PACK | Freq: Once | INTRAVENOUS | Status: AC | PRN
  Administered 2023-01-12: 7.9 via INTRAVENOUS

## 2023-01-12 MED ORDER — TECHNETIUM TC 99M TETROFOSMIN IV KIT
25.1000 | PACK | Freq: Once | INTRAVENOUS | Status: AC | PRN
  Administered 2023-01-12: 25.1 via INTRAVENOUS

## 2023-01-12 MED ORDER — TRAMADOL HCL 50 MG PO TABS
50.0000 mg | ORAL_TABLET | Freq: Three times a day (TID) | ORAL | 0 refills | Status: AC | PRN
Start: 2023-01-12 — End: 2023-01-17

## 2023-01-12 NOTE — Assessment & Plan Note (Signed)
S/Sx consistent with acute diverticulitis. Vital signs normal. Appropriate for conservative outpatient management with a light diet and pain meds. Discussed red flag symptoms that should prompt more urgent evaluation.

## 2023-01-12 NOTE — Progress Notes (Signed)
[facility] [address] [place] [address] Dept: [phone] Dept Fax: [phone]  Office Visit  Subjective:    Patient ID: Bonnie Reed, female    DOB: 11/08/1945, 77 y.o..   MRN: [id number]  Chief Complaint  Patient presents with   Urinary Frequency    C/o having urine frequency, low back pain and low abdominal pain x 1 week.    History of Present Illness:  Patient is in today for a 1-week history of urinary frequency, associated with lower back, and left flank pain radiating tot he left lower quadrant. She has been taking AZO, but has not noted her symptoms improving. She did have some diarrhea for 2 days. She denies any hematochezia. She admits to a past history of diverticulitis, including a prior hospitalization. She denies any fever, nausea or vomiting.  Past Medical History: Patient Active Problem List   Diagnosis Date Noted   B12 deficiency 03/03/2022   Insomnia 12/03/2021   Osteopenia due to cancer therapy 09/30/2021   Depression 07/22/2021   Anemia 07/22/2021   Malignant neoplasm of lower-inner quadrant of left breast in female, estrogen receptor positive (HCC) 07/07/2021   Right leg pain 02/08/2019   Hypochromic microcytic anemia 04/08/2018   Eczema 04/08/2018   Mixed hyperlipidemia 04/08/2018   Primary osteoarthritis of right knee 02/01/2018   Healthcare maintenance 04/21/2017   Rhinitis, chronic 03/22/2017   Type 2 diabetes mellitus with complication, with long-term current use of insulin (HCC) 10/16/2016   Hypertension 10/16/2016   CHF (congestive heart failure) (HCC) 10/16/2016   Hypertrophy of nasal turbinates 06/16/2016   Calculus of kidney 03/31/2016   Abnormal nuclear stress test 03/31/2016   Hx of cardiac cath 03/31/2016   Gastroesophageal reflux disease without esophagitis 03/31/2016   Nontoxic goiter, unspecified 03/31/2016   Allergy to wheat 11/22/2014   Allergy to pollen 11/22/2014    Allergy to dog dander 11/22/2014   Cervical spondylosis without myelopathy 06/19/2011   Low back pain 06/19/2011   Lumbosacral spondylosis without myelopathy 06/19/2011   Neck pain 06/19/2011   CAD (coronary artery disease) 01/28/2011   Past Surgical History:  Procedure Laterality Date   APPENDECTOMY     BREAST BIOPSY Right 08/01/2021   BREAST LUMPECTOMY Left 1997   CESAREAN SECTION     CHOLECYSTECTOMY     EVACUATION BREAST HEMATOMA Left 07/10/2022   Procedure: DRAIN PLACEMENT LEFT MASTECTOMY CAVITY;  Surgeon: Griselda Miner, MD;  Location: [facility];  Service: General;  Laterality: Left;   HEMATOMA EVACUATION Left 03/12/2022   Procedure: PLACEMENT OF DRAIN LEFT CHEST WALL;  Surgeon: Griselda Miner, MD;  Location: [facility] OR;  Service: General;  Laterality: Left;   KNEE ARTHROSCOPY Right    LESION REMOVAL Left 08/25/2021   Procedure: EXCISION SKIN LESION LEFT CHEST WALL;  Surgeon: Griselda Miner, MD;  Location: [facility] OR;  Service: General;  Laterality: Left;   REDUCTION MAMMAPLASTY Right 1998   SIMPLE MASTECTOMY WITH AXILLARY SENTINEL NODE BIOPSY Left 08/25/2021   Procedure: LEFT MASTECTOMY;  Surgeon: Griselda Miner, MD;  Location: [facility] OR;  Service: General;  Laterality: Left;   TOTAL HIP ARTHROPLASTY Right    TUBAL LIGATION     Family History  Problem Relation Age of Onset   Stroke Mother    Kidney disease Mother    Dementia Mother    Cancer Father        lung   Colon cancer Neg Hx    Rectal cancer Neg  Hx    Outpatient Medications Prior to Visit  Medication Sig Dispense Refill   ACCU-CHEK SOFTCLIX LANCETS lancets Use to check blood sugar three times daily or as directed. (Patient taking differently: 1 each by Other route See admin instructions. Use to check blood sugar three times daily or as directed.) 100 each 12   anastrozole (ARIMIDEX) 1 MG tablet Take 1 tablet (1 mg total) by mouth daily. 30 tablet 2   aspirin EC 81 MG tablet Take 81 mg by mouth in the morning.     atorvastatin  (LIPITOR) 40 MG tablet Take 1 tablet (40 mg total) by mouth daily. 90 tablet 1   BD PEN NEEDLE NANO 2ND GEN 32G X 4 MM MISC USE AS DIRECTED THREE TIMES DAILY (Patient taking differently: 1 each by Other route 3 (three) times daily. as directed) 100 each 0   Blood Glucose Monitoring Suppl (GLUCOCOM BLOOD GLUCOSE MONITOR) DEVI 1 each by Other route daily.     Carboxymethylcell-Hypromellose (GENTEAL OP) Place 1 drop into both eyes daily as needed (dry eyes).     cetirizine (ZYRTEC) 10 MG tablet Take 10 mg by mouth daily as needed for allergies.     clobetasol ointment (TEMOVATE) 0.05 % Apply 1 Application topically as needed (rash).     Continuous Blood Gluc Receiver (FREESTYLE LIBRE 3 READER) DEVI 1 each by Does not apply route every 14 (fourteen) days. 2 each 3   denosumab (PROLIA) 60 MG/ML SOSY injection Inject 60 mg into the skin every 6 (six) months.     esomeprazole (NEXIUM) 40 MG capsule Take 1 capsule (40 mg total) by mouth daily. 90 capsule 3   fluticasone (FLONASE) 50 MCG/ACT nasal spray Place 2 sprays into both nostrils daily as needed for allergies. (Patient taking differently: Place 2 sprays into both nostrils in the morning and at bedtime.) 15.8 mL 3   folic acid (FOLVITE) 1 MG tablet Take 1 tablet (1 mg total) by mouth daily. 30 tablet 11   glucose blood (ONETOUCH VERIO) test strip USE AS DIRECTED THREE TIMES DAILY (Patient taking differently: 1 each by Other route as needed for other. USE AS DIRECTED THREE TIMES DAILY) 100 each 0   ibuprofen (ADVIL) 200 MG tablet Take 400 mg by mouth every 8 (eight) hours as needed (pain.).     insulin glargine (LANTUS SOLOSTAR) 100 UNIT/ML Solostar Pen Inject 20 Units into the skin 2 (two) times daily. 15 mL PRN   JARDIANCE 10 MG TABS tablet Take 1 tablet by mouth once daily 90 tablet 0   Lancet Devices (ONE TOUCH DELICA LANCING DEV) MISC Use to test 3 times a day Dx:E11.9 (Patient taking differently: 1 each by Other route See admin instructions. Use to  test 3 times a day Dx:E11.9) 1 each 0   LINZESS 145 MCG CAPS capsule TAKE 1 CAPSULE BY MOUTH ONCE DAILY AS NEEDED FOR CONSTIPATION (Patient taking differently: Take 145 mcg by mouth daily before breakfast.) 30 capsule 0   nitroGLYCERIN (NITROSTAT) 0.4 MG SL tablet Place 1 tablet (0.4 mg total) under the tongue every 5 (five) minutes as needed for chest pain. 25 tablet 1   Semaglutide, 2 MG/DOSE, (OZEMPIC, 2 MG/DOSE,) 8 MG/3ML SOPN Inject 2 mg into the skin once a week. 3 mL 1   traMADol (ULTRAM) 50 MG tablet Take 1 tablet (50 mg total) by mouth every 6 (six) hours as needed. (Patient taking differently: Take 50 mg by mouth every 6 (six) hours as needed for moderate pain (  pain score 4-6) or severe pain (pain score 7-10).) 60 tablet 0   traZODone (DESYREL) 50 MG tablet TAKE 1/2 TO 1 (ONE-HALF TO ONE) TABLET BY MOUTH AT BEDTIME AS NEEDED FOR SLEEP (Patient taking differently: Take 25-50 mg by mouth at bedtime as needed for sleep.) 90 tablet 0   valsartan (DIOVAN) 80 MG tablet Take 1 tablet by mouth once daily 90 tablet 1   fluconazole (DIFLUCAN) 150 MG tablet Take 1 tablet after finishing the antibiotic and 1 tablet 3 days later if needed (Patient not taking: Reported on 01/12/2023) 2 tablet 0   No facility-administered medications prior to visit.   No Active Allergies   Objective:   Today's Vitals   01/12/23 1425  BP: 126/68  Pulse: 83  Temp: 97.8 F (36.6 C)  TempSrc: Temporal  SpO2: 100%  Weight: 151 lb (68.5 kg)  Height: 5\' 1"  (1.549 m)   Body mass index is 28.53 kg/m.   General: Well developed, well nourished. No acute distress. Abdomen: Soft. Bowel sounds positive, normal pitch and frequency. No hepatosplenomegaly. There is midl to   moderate pain in the left lower quadrant. No rebound or guarding. Psych: Alert and oriented. Normal mood and affect.  Health Maintenance Due  Topic Date Due   FOOT EXAM  10/25/2022   OPHTHALMOLOGY EXAM  10/30/2022   HEMOGLOBIN A1C  12/10/2022    Diabetic kidney evaluation - Urine ACR  01/27/2023   Lab Results Component Ref Range & Units 14:33  Color, UA yellow  Clarity, UA clear  Glucose, UA Negative Negative  Comment: 3+  Bilirubin, UA neg  Ketones, UA neg  Spec Grav, UA 1.010 - 1.025 1.015  Blood, UA neg  pH, UA 5.0 - 8.0 6.0  Protein, UA Negative Negative  Urobilinogen, UA 0.2 or 1.0 E.U./dL 0.2 Abnormal   Nitrite, UA neg  Leukocytes, UA Negative Negative      Assessment & Plan:   Problem List Items Addressed This Visit       Digestive   Acute diverticulitis - Primary    S/Sx consistent with acute diverticulitis. Vital signs normal. Appropriate for conservative outpatient management with a light diet and pain meds. Discussed red flag symptoms that should prompt more urgent evaluation.      Relevant Medications   traMADol (ULTRAM) 50 MG tablet   Other Relevant Orders   POCT Urinalysis Dipstick (Completed)    Return if symptoms worsen or fail to improve.   Loyola Mast, MD

## 2023-01-12 NOTE — Patient Instructions (Signed)
Diverticulitis diet  By [facility] Staff  Definition  A diverticulitis diet is something your doctor might recommend as part of a short-term treatment plan for acute diverticulitis.    Diverticula are small, bulging pouches that can form in the lining of the digestive system. They're found most often in the lower part of the large intestine (colon). This condition is called diverticulosis.    In some cases, one or more of the pouches become inflamed or infected. This is known as diverticulitis.    Mild cases of diverticulitis are usually treated with antibiotics and a low-fiber diet, or treatment may start with a period of rest where you eat nothing by mouth, then start with clear liquids and then move to a low-fiber diet until your condition improves. More-severe cases typically require hospitalization.    Purpose  Nutrition therapy for diverticulitis is a temporary measure to give your digestive system a chance to rest. Eat small amounts until bleeding and diarrhea subside.    Diet details  Your diet starts with only clear liquids for a few days. Examples of items allowed on a clear liquid diet include:    Broth  Fruit juices without pulp, such as apple juice  Ice chips  Ice pops without bits of fruit or fruit pulp  Gelatin  Water  Tea or coffee without cream  As you start feeling better, your doctor will recommend that you slowly add low-fiber foods. Examples of low-fiber foods include:    Canned or cooked fruits without skin or seeds  Canned or cooked vegetables such as green beans, carrots and potatoes (without the skin)  Eggs, fish and poultry  Refined white bread  Fruit and vegetable juice with no pulp  Low-fiber cereals  Milk, yogurt and cheese  White rice, pasta and noodles  Results  You should feel better within two or three days of starting the diet and antibiotics. If you haven't started feeling better by then, call your doctor. Also contact your doctor if:    You develop a fever  Your abdominal pain is  worsening  You're unable to keep clear liquids down  These may indicate a complication that requires hospitalization.    Risks  Nutrition therapy for diverticulitis has few risks. However, continuing a clear liquid diet for more than a few days can lead to weakness and other complications, since it doesn't provide enough of the nutrients your body needs. For this reason, your doctor will want you to transition back to a normal diet that includes foods with fiber as soon as you can tolerate it.

## 2023-01-14 LAB — MYOCARDIAL PERFUSION IMAGING
LV dias vol: 62 mL (ref 46–106)
LV sys vol: 21 mL
Nuc Stress EF: 67 %
Peak HR: 88 {beats}/min
Rest HR: 62 {beats}/min
Rest Nuclear Isotope Dose: 7.9 mCi
SDS: 2
SRS: 1
SSS: 3
ST Depression (mm): 0 mm
Stress Nuclear Isotope Dose: 25.1 mCi
TID: 0.84

## 2023-01-19 ENCOUNTER — Telehealth: Payer: Self-pay

## 2023-01-19 ENCOUNTER — Other Ambulatory Visit: Payer: Self-pay | Admitting: Nurse Practitioner

## 2023-01-19 NOTE — Telephone Encounter (Signed)
Patient notified of results and verbalized understanding.  

## 2023-01-19 NOTE — Telephone Encounter (Signed)
-----   Message from Gypsy Balsam sent at 01/15/2023  3:35 PM EDT ----- Chest x-ray no ischemia no myocardial infarction

## 2023-01-19 NOTE — Telephone Encounter (Signed)
Requesting:  Linzess 145 MCG Oral Capsule  Last Visit: 06/09/2022 Next Visit: 04/23/2023 Last Refill: 12/14/2022  Please Advise

## 2023-01-22 ENCOUNTER — Other Ambulatory Visit: Payer: Self-pay | Admitting: *Deleted

## 2023-01-22 DIAGNOSIS — Z170 Estrogen receptor positive status [ER+]: Secondary | ICD-10-CM

## 2023-01-22 DIAGNOSIS — C50912 Malignant neoplasm of unspecified site of left female breast: Secondary | ICD-10-CM | POA: Diagnosis not present

## 2023-01-22 MED ORDER — ANASTROZOLE 1 MG PO TABS: 1.0000 mg | ORAL_TABLET | Freq: Every day | ORAL | 2 refills | Status: DC

## 2023-01-26 DIAGNOSIS — Z961 Presence of intraocular lens: Secondary | ICD-10-CM | POA: Diagnosis not present

## 2023-01-26 DIAGNOSIS — H52203 Unspecified astigmatism, bilateral: Secondary | ICD-10-CM | POA: Diagnosis not present

## 2023-01-26 DIAGNOSIS — E119 Type 2 diabetes mellitus without complications: Secondary | ICD-10-CM | POA: Diagnosis not present

## 2023-01-26 LAB — HM DIABETES EYE EXAM

## 2023-01-27 DIAGNOSIS — H52209 Unspecified astigmatism, unspecified eye: Secondary | ICD-10-CM | POA: Diagnosis not present

## 2023-01-27 DIAGNOSIS — H5213 Myopia, bilateral: Secondary | ICD-10-CM | POA: Diagnosis not present

## 2023-01-27 DIAGNOSIS — H524 Presbyopia: Secondary | ICD-10-CM | POA: Diagnosis not present

## 2023-01-28 ENCOUNTER — Encounter: Payer: Self-pay | Admitting: Nurse Practitioner

## 2023-01-29 ENCOUNTER — Other Ambulatory Visit: Payer: Self-pay | Admitting: *Deleted

## 2023-01-29 DIAGNOSIS — Z170 Estrogen receptor positive status [ER+]: Secondary | ICD-10-CM

## 2023-01-29 MED ORDER — ANASTROZOLE 1 MG PO TABS: 1.0000 mg | ORAL_TABLET | Freq: Every day | ORAL | 2 refills | Status: DC

## 2023-01-31 ENCOUNTER — Other Ambulatory Visit: Payer: Self-pay | Admitting: Nurse Practitioner

## 2023-01-31 DIAGNOSIS — E1122 Type 2 diabetes mellitus with diabetic chronic kidney disease: Secondary | ICD-10-CM

## 2023-02-01 ENCOUNTER — Inpatient Hospital Stay: Payer: Medicare HMO

## 2023-02-01 ENCOUNTER — Inpatient Hospital Stay: Payer: Medicare HMO | Admitting: Medical Oncology

## 2023-02-01 NOTE — Telephone Encounter (Signed)
Requesting: Lum Babe In Vitro Strip  Last Visit: 06/09/2022 Next Visit: 04/23/2023 Last Refill: 12/30/2022  Please Advise

## 2023-02-17 ENCOUNTER — Other Ambulatory Visit: Payer: Self-pay | Admitting: Nurse Practitioner

## 2023-03-03 ENCOUNTER — Other Ambulatory Visit: Payer: Self-pay | Admitting: Nurse Practitioner

## 2023-03-06 ENCOUNTER — Other Ambulatory Visit: Payer: Self-pay | Admitting: Nurse Practitioner

## 2023-03-07 ENCOUNTER — Other Ambulatory Visit: Payer: Self-pay

## 2023-03-07 ENCOUNTER — Emergency Department (HOSPITAL_BASED_OUTPATIENT_CLINIC_OR_DEPARTMENT_OTHER)
Admission: EM | Admit: 2023-03-07 | Discharge: 2023-03-07 | Disposition: A | Discharge status: Home / Self Care | Payer: Medicare HMO

## 2023-03-07 ENCOUNTER — Emergency Department (HOSPITAL_BASED_OUTPATIENT_CLINIC_OR_DEPARTMENT_OTHER): Payer: Medicare HMO

## 2023-03-07 ENCOUNTER — Encounter (HOSPITAL_BASED_OUTPATIENT_CLINIC_OR_DEPARTMENT_OTHER): Payer: Self-pay | Admitting: Emergency Medicine

## 2023-03-07 DIAGNOSIS — R0982 Postnasal drip: Secondary | ICD-10-CM | POA: Insufficient documentation

## 2023-03-07 DIAGNOSIS — J329 Chronic sinusitis, unspecified: Secondary | ICD-10-CM | POA: Diagnosis not present

## 2023-03-07 DIAGNOSIS — Z853 Personal history of malignant neoplasm of breast: Secondary | ICD-10-CM | POA: Insufficient documentation

## 2023-03-07 DIAGNOSIS — Z794 Long term (current) use of insulin: Secondary | ICD-10-CM | POA: Insufficient documentation

## 2023-03-07 DIAGNOSIS — I700 Atherosclerosis of aorta: Secondary | ICD-10-CM | POA: Insufficient documentation

## 2023-03-07 DIAGNOSIS — I517 Cardiomegaly: Secondary | ICD-10-CM | POA: Diagnosis not present

## 2023-03-07 DIAGNOSIS — R0789 Other chest pain: Secondary | ICD-10-CM | POA: Diagnosis not present

## 2023-03-07 DIAGNOSIS — I509 Heart failure, unspecified: Secondary | ICD-10-CM | POA: Diagnosis not present

## 2023-03-07 DIAGNOSIS — I444 Left anterior fascicular block: Secondary | ICD-10-CM | POA: Diagnosis not present

## 2023-03-07 DIAGNOSIS — Z20822 Contact with and (suspected) exposure to covid-19: Secondary | ICD-10-CM | POA: Insufficient documentation

## 2023-03-07 DIAGNOSIS — Z7982 Long term (current) use of aspirin: Secondary | ICD-10-CM | POA: Diagnosis not present

## 2023-03-07 DIAGNOSIS — J45909 Unspecified asthma, uncomplicated: Secondary | ICD-10-CM | POA: Insufficient documentation

## 2023-03-07 DIAGNOSIS — R059 Cough, unspecified: Secondary | ICD-10-CM | POA: Diagnosis not present

## 2023-03-07 LAB — CBC
HCT: 42.9 % (ref 36.0–46.0)
Hemoglobin: 13.1 g/dL (ref 12.0–15.0)
MCH: 22.1 pg — ABNORMAL LOW (ref 26.0–34.0)
MCHC: 30.5 g/dL (ref 30.0–36.0)
MCV: 72.5 fL — ABNORMAL LOW (ref 80.0–100.0)
Platelets: 261 10*3/uL (ref 150–400)
RBC: 5.92 MIL/uL — ABNORMAL HIGH (ref 3.87–5.11)
RDW: 16.3 % — ABNORMAL HIGH (ref 11.5–15.5)
WBC: 8.6 10*3/uL (ref 4.0–10.5)
nRBC: 0 % (ref 0.0–0.2)

## 2023-03-07 LAB — BASIC METABOLIC PANEL
Anion gap: 8 (ref 5–15)
BUN: 15 mg/dL (ref 8–23)
CO2: 21 mmol/L — ABNORMAL LOW (ref 22–32)
Calcium: 9 mg/dL (ref 8.9–10.3)
Chloride: 109 mmol/L (ref 98–111)
Creatinine, Ser: 0.75 mg/dL (ref 0.44–1.00)
GFR, Estimated: >60 mL/min (ref >60)
Glucose, Bld: 109 mg/dL — ABNORMAL HIGH (ref 70–99)
Potassium: 4.5 mmol/L (ref 3.5–5.1)
Sodium: 138 mmol/L (ref 135–145)

## 2023-03-07 LAB — RESP PANEL BY RT-PCR (RSV, FLU A&B, COVID)  RVPGX2
Influenza A by PCR: NEGATIVE
Influenza B by PCR: NEGATIVE
Resp Syncytial Virus by PCR: NEGATIVE
SARS Coronavirus 2 by RT PCR: NEGATIVE

## 2023-03-07 LAB — TROPONIN I (HIGH SENSITIVITY): Troponin I (High Sensitivity): 4 ng/L (ref <18)

## 2023-03-07 MED ORDER — AMOXICILLIN-POT CLAVULANATE 875-125 MG PO TABS: 1.0000 | ORAL_TABLET | Freq: Two times a day (BID) | ORAL | 0 refills | Status: DC

## 2023-03-07 MED ORDER — IPRATROPIUM-ALBUTEROL 0.5-2.5 (3) MG/3ML IN SOLN
3.0000 mL | Freq: Once | RESPIRATORY_TRACT | Status: AC
  Administered 2023-03-07: 3 mL via RESPIRATORY_TRACT
  Filled 2023-03-07: qty 3

## 2023-03-07 MED ORDER — AMOXICILLIN-POT CLAVULANATE 875-125 MG PO TABS
1.0000 | ORAL_TABLET | Freq: Once | ORAL | Status: AC
  Administered 2023-03-07: 1 via ORAL
  Filled 2023-03-07: qty 1

## 2023-03-07 MED ORDER — ALBUTEROL SULFATE HFA 108 (90 BASE) MCG/ACT IN AERS
1.0000 | INHALATION_SPRAY | Freq: Four times a day (QID) | RESPIRATORY_TRACT | 0 refills | Status: DC | PRN
Start: 2023-03-07 — End: 2023-04-01

## 2023-03-07 NOTE — Discharge Instructions (Signed)
Please take the antibiotic as prescribed.  Use 2 puffs of the albuterol every 4 hours as needed.  Please follow-up with the allergist as scheduled in January.  Return to the ER for worsening symptoms.

## 2023-03-07 NOTE — ED Triage Notes (Signed)
URI x 3 weeks , persistent cough , chest tightness. Denies shortness of breath  .  Reports diarrhea this morning .  Denies abd pain

## 2023-03-07 NOTE — ED Provider Notes (Signed)
[facility] Provider Note   CSN: [id number] Arrival date & time: 03/07/23  1914     History  Chief Complaint  Patient presents with   Cough    Bonnie Reed is a 77 y.o. female.  77 year old female with past medical history of asthma, breast cancer in remission, and CHF presenting to the emergency department today with nasal congestion and cough.  This been going on for the past 3 weeks.  The patient states that she has had some intermittent chest tightness during this time.  She reports that she is coughing but this is largely nonproductive.  She does report a lot of postnasal drip.  She reports that her ears are itchy.  She states that she did schedule appointment with an allergist but was told to come to the ER if her symptoms worsen.  She reports that over the past 2 days that her symptoms do seem to be getting worse.  She has had some chills but denies any fevers.  She denies any nausea or vomiting but has had 1 loose stool this morning.  She denies any leg pain or swelling.  Denies any orthopnea.  She denies a history of DVT or pulmonary embolism, recent surgeries, recent travel.   Cough Associated symptoms: chills        Home Medications Prior to Admission medications   Medication Sig Start Date End Date Taking? Authorizing Provider  albuterol (VENTOLIN HFA) 108 (90 Base) MCG/ACT inhaler Inhale 1-2 puffs into the lungs every 6 (six) hours as needed for wheezing or shortness of breath. 03/07/23  Yes Durwin Glaze, MD  amoxicillin-clavulanate (AUGMENTIN) 875-125 MG tablet Take 1 tablet by mouth every 12 (twelve) hours. 03/07/23  Yes Durwin Glaze, MD  ACCU-CHEK SOFTCLIX LANCETS lancets Use to check blood sugar three times daily or as directed. Patient taking differently: 1 each by Other route See admin instructions. Use to check blood sugar three times daily or as directed. 05/26/18   Westley Chandler, MD  anastrozole (ARIMIDEX) 1 MG  tablet Take 1 tablet (1 mg total) by mouth daily. 01/29/23   Josph Macho, MD  aspirin EC 81 MG tablet Take 81 mg by mouth in the morning.    [provider]  atorvastatin (LIPITOR) 40 MG tablet Take 1 tablet (40 mg total) by mouth daily. 12/03/21   McElwee, Lauren A, NP  BD PEN NEEDLE NANO 2ND GEN 32G X 4 MM MISC USE AS DIRECTED THREE TIMES DAILY Patient taking differently: 1 each by Other route 3 (three) times daily. as directed 12/09/22   McElwee, Lauren A, NP  Blood Glucose Monitoring Suppl (GLUCOCOM BLOOD GLUCOSE MONITOR) DEVI 1 each by Other route daily. 03/29/18   [provider]  Carboxymethylcell-Hypromellose (GENTEAL OP) Place 1 drop into both eyes daily as needed (dry eyes).    [provider]  cetirizine (ZYRTEC) 10 MG tablet Take 10 mg by mouth daily as needed for allergies.    [provider]  clobetasol ointment (TEMOVATE) 0.05 % Apply 1 Application topically as needed (rash). 09/01/22   [provider]  Continuous Blood Gluc Receiver (FREESTYLE LIBRE 3 READER) DEVI 1 each by Does not apply route every 14 (fourteen) days. 06/09/22   McElwee, Jake Church, NP  denosumab (PROLIA) 60 MG/ML SOSY injection Inject 60 mg into the skin every 6 (six) months.    [provider]  esomeprazole (NEXIUM) 40 MG capsule Take 1 capsule (40 mg  total) by mouth daily. 06/09/22   McElwee, Jake Church, NP  fluconazole (DIFLUCAN) 150 MG tablet Take 1 tablet after finishing the antibiotic and 1 tablet 3 days later if needed Patient not taking: Reported on 01/12/2023 06/09/22   McElwee, Lauren A, NP  fluticasone (FLONASE) 50 MCG/ACT nasal spray Place 2 sprays into both nostrils daily as needed for allergies. Patient taking differently: Place 2 sprays into both nostrils in the morning and at bedtime. 06/09/22   McElwee, Jake Church, NP  folic acid (FOLVITE) 1 MG tablet Take 1 tablet (1 mg total) by mouth daily. 11/16/22   Josph Macho, MD  ibuprofen (ADVIL) 200 MG  tablet Take 400 mg by mouth every 8 (eight) hours as needed (pain.).    [provider]  insulin glargine (LANTUS SOLOSTAR) 100 UNIT/ML Solostar Pen Inject 20 Units into the skin 2 (two) times daily. 03/03/22   McElwee, Jake Church, NP  JARDIANCE 10 MG TABS tablet Take 1 tablet by mouth once daily 12/31/22   McElwee, Jake Church, NP  Lancet Devices (ONE TOUCH DELICA LANCING DEV) MISC Use to test 3 times a day Dx:E11.9 Patient taking differently: 1 each by Other route See admin instructions. Use to test 3 times a day Dx:E11.9 03/29/18   Nestor Ramp, MD  LINZESS 145 MCG CAPS capsule TAKE 1 CAPSULE BY MOUTH ONCE DAILY AS NEEDED FOR CONSTIPATION 02/17/23   McElwee, Lauren A, NP  nitroGLYCERIN (NITROSTAT) 0.4 MG SL tablet Place 1 tablet (0.4 mg total) under the tongue every 5 (five) minutes as needed for chest pain. 07/09/22   Flossie Dibble, NP  ONETOUCH VERIO test strip USE AS DIRECTED THREE TIMES DAILY 02/01/23   McElwee, Lauren A, NP  OZEMPIC, 2 MG/DOSE, 8 MG/3ML SOPN INJECT 2MG  UNDER THE SKIN ONCE WEEKLY 03/04/23   McElwee, Lauren A, NP  traMADol (ULTRAM) 50 MG tablet Take 1 tablet (50 mg total) by mouth every 6 (six) hours as needed. Patient taking differently: Take 50 mg by mouth every 6 (six) hours as needed for moderate pain (pain score 4-6) or severe pain (pain score 7-10). 09/07/22   Josph Macho, MD  traZODone (DESYREL) 50 MG tablet TAKE 1/2 TO 1 (ONE-HALF TO ONE) TABLET BY MOUTH AT BEDTIME AS NEEDED FOR SLEEP Patient taking differently: Take 25-50 mg by mouth at bedtime as needed for sleep. 09/02/22   McElwee, Jake Church, NP  valsartan (DIOVAN) 80 MG tablet Take 1 tablet by mouth once daily 12/31/22   McElwee, Jake Church, NP      Allergies    Patient has no known allergies.    Review of Systems   Review of Systems  Constitutional:  Positive for chills.  HENT:  Positive for congestion.   Respiratory:  Positive for cough.   All other systems reviewed and are negative.   Physical  Exam Updated Vital Signs BP 139/84   Pulse 73   Temp 97.9 F (36.6 C)   Resp 14   Wt 65.8 kg   LMP 10/17/1991 (Exact Date)   SpO2 100%   BMI 27.40 kg/m  Physical Exam Vitals and nursing note reviewed.   Gen: NAD Eyes: PERRL, EOMI HEENT: no oropharyngeal swelling, boggy nasal turbinates with clear rhinorrhea Neck: trachea midline Resp: Diminished at bilateral lung bases with bronchospastic cough noted Card: RRR, no murmurs, rubs, or gallops Abd: nontender, nondistended Extremities: no calf tenderness, no edema Vascular: 2+ radial pulses bilaterally, 2+ DP pulses bilaterally Skin: no rashes Psyc: acting appropriately  ED Results / Procedures / Treatments   Labs (all labs ordered are listed, but only abnormal results are displayed) Labs Reviewed  BASIC METABOLIC PANEL - Abnormal; Notable for the following components:      Result Value   CO2 21 (*)    Glucose, Bld 109 (*)    All other components within normal limits  CBC - Abnormal; Notable for the following components:   RBC 5.92 (*)    MCV 72.5 (*)    MCH 22.1 (*)    RDW 16.3 (*)    All other components within normal limits  RESP PANEL BY RT-PCR (RSV, FLU A&B, COVID)  RVPGX2  TROPONIN I (HIGH SENSITIVITY)  TROPONIN I (HIGH SENSITIVITY)    EKG EKG Interpretation Date/Time:  Sunday March 07 2023 08:13:06 EST Ventricular Rate:  72 PR Interval:  189 QRS Duration:  99 QT Interval:  412 QTC Calculation: 451 R Axis:   -43  Text Interpretation: Sinus rhythm Left anterior fascicular block RSR' in V1 or V2, right VCD or RVH Consider anterior infarct Confirmed by Beckey Downing [phone]) on 03/07/2023 9:36:35 AM  Radiology DG Chest Port 1 View Result Date: 03/07/2023 CLINICAL DATA:  77 year old female with history of cough. EXAM: PORTABLE CHEST 1 VIEW COMPARISON:  Chest x-ray 07/05/2022. FINDINGS: Lung volumes are normal. No consolidative airspace disease. No pleural effusions. No pneumothorax. No evidence of  pulmonary edema. Heart size is borderline enlarged. Upper mediastinal contours are within normal limits. Atherosclerotic calcifications in the thoracic aorta. Surgical clips in the left axillary region from prior lymph node dissection. IMPRESSION: 1. No radiographic evidence of acute cardiopulmonary disease. 2. Aortic atherosclerosis. Electronically Signed   By: Trudie Reed M.D.   On: 03/07/2023 09:02    Procedures Procedures    Medications Ordered in ED Medications  amoxicillin-clavulanate (AUGMENTIN) 875-125 MG per tablet 1 tablet (has no administration in time range)  ipratropium-albuterol (DUONEB) 0.5-2.5 (3) MG/3ML nebulizer solution 3 mL (3 mLs Nebulization Given 03/07/23 0808)    ED Course/ Medical Decision Making/ A&P                                 Medical Decision Making 77 year old female with past medical history of CHF and breast cancer in remission as well as remote history of asthma presenting to the emergency department today with nasal congestion and bronchospastic cough for the past 3 weeks.  I will further evaluate the patient here with basic labs as well as an EKG, chest x-ray, and troponin for further evaluation for atypical ACS, pulmonary edema, pulmonary infiltrates, pneumothorax, or myocarditis.  The patient does have a bronchospastic cough here.  Will give her a DuoNeb here.  I will reevaluate for ultimate disposition.  The patient's vital signs here are stable.  I think that if her workup is reassuring that she may be treated for sinusitis given the duration of symptoms for the past 3 weeks.   The patient is EKG is unchanged from her baseline EKG.  Troponin here is negative.  Chest x-ray is clear.  I think she is stable for discharge.  Will treat her for bacterial sinusitis.  She is encouraged to follow-up with the allergist in January.  Amount and/or Complexity of Data Reviewed Labs: ordered. Radiology: ordered.  Risk Prescription drug  management.           Final Clinical Impression(s) / ED Diagnoses Final diagnoses:  Sinusitis, unspecified chronicity, unspecified location  Rx / DC Orders ED Discharge Orders          Ordered    amoxicillin-clavulanate (AUGMENTIN) 875-125 MG tablet  Every 12 hours        03/07/23 0938    albuterol (VENTOLIN HFA) 108 (90 Base) MCG/ACT inhaler  Every 6 hours PRN        03/07/23 0938              Durwin Glaze, MD 03/07/23 [phone]

## 2023-03-07 NOTE — ED Notes (Signed)
RT Note:  Patient has an Albuterol inhaler for home use and has requested a refill to take home when she leaves.

## 2023-03-09 ENCOUNTER — Other Ambulatory Visit: Payer: Self-pay | Admitting: Nurse Practitioner

## 2023-03-09 DIAGNOSIS — E1122 Type 2 diabetes mellitus with diabetic chronic kidney disease: Secondary | ICD-10-CM

## 2023-03-09 NOTE — Telephone Encounter (Signed)
Requesting: Lum Babe In Vitro Strip  Last Visit: 06/09/2022 Next Visit: 04/26/2023 Last Refill: 02/01/2023  Please Advise

## 2023-03-09 NOTE — Telephone Encounter (Signed)
Requesting: BD NANO PEN NDL 32GX4MM MIS  Last Visit: 06/09/2022 Next Visit: 03/09/2023 Last Refill: 12/09/2022  Please Advise

## 2023-03-26 ENCOUNTER — Other Ambulatory Visit: Payer: Self-pay | Admitting: Nurse Practitioner

## 2023-03-26 NOTE — Telephone Encounter (Signed)
 Requesting:  Lantus SoloStar 100 UNIT/ML Subcutaneous Solution Pen-injector  Last Visit: 06/09/2022 Next Visit: 04/26/2023 Last Refill: 03/03/2022  Please Advise

## 2023-03-27 ENCOUNTER — Other Ambulatory Visit: Payer: Self-pay | Admitting: Nurse Practitioner

## 2023-03-27 DIAGNOSIS — E118 Type 2 diabetes mellitus with unspecified complications: Secondary | ICD-10-CM

## 2023-03-30 ENCOUNTER — Other Ambulatory Visit: Payer: Self-pay | Admitting: Nurse Practitioner

## 2023-03-30 NOTE — Telephone Encounter (Signed)
 Requesting: Jardiance 10 MG Oral Tablet  Last Visit: 06/09/2022 Next Visit: 03/30/2023 Last Refill: 12/31/2022  Please Advise

## 2023-04-01 ENCOUNTER — Ambulatory Visit (INDEPENDENT_AMBULATORY_CARE_PROVIDER_SITE_OTHER): Payer: Medicare HMO | Admitting: Nurse Practitioner

## 2023-04-01 ENCOUNTER — Encounter: Payer: Self-pay | Admitting: Nurse Practitioner

## 2023-04-01 VITALS — BP 128/68 | HR 75 | Temp 97.8°F | Ht 61.0 in | Wt 149.8 lb

## 2023-04-01 DIAGNOSIS — Z7985 Long-term (current) use of injectable non-insulin antidiabetic drugs: Secondary | ICD-10-CM

## 2023-04-01 DIAGNOSIS — Z7984 Long term (current) use of oral hypoglycemic drugs: Secondary | ICD-10-CM

## 2023-04-01 DIAGNOSIS — E782 Mixed hyperlipidemia: Secondary | ICD-10-CM | POA: Diagnosis not present

## 2023-04-01 DIAGNOSIS — I10 Essential (primary) hypertension: Secondary | ICD-10-CM | POA: Diagnosis not present

## 2023-04-01 DIAGNOSIS — E118 Type 2 diabetes mellitus with unspecified complications: Secondary | ICD-10-CM

## 2023-04-01 DIAGNOSIS — I700 Atherosclerosis of aorta: Secondary | ICD-10-CM | POA: Insufficient documentation

## 2023-04-01 DIAGNOSIS — J0140 Acute pansinusitis, unspecified: Secondary | ICD-10-CM

## 2023-04-01 DIAGNOSIS — Z794 Long term (current) use of insulin: Secondary | ICD-10-CM | POA: Diagnosis not present

## 2023-04-01 DIAGNOSIS — K5792 Diverticulitis of intestine, part unspecified, without perforation or abscess without bleeding: Secondary | ICD-10-CM | POA: Diagnosis not present

## 2023-04-01 HISTORY — DX: Atherosclerosis of aorta: I70.0

## 2023-04-01 HISTORY — DX: Long-term (current) use of injectable non-insulin antidiabetic drugs: Z79.85

## 2023-04-01 HISTORY — DX: Long term (current) use of oral hypoglycemic drugs: Z79.84

## 2023-04-01 LAB — MICROALBUMIN / CREATININE URINE RATIO
Creatinine,U: 145.5 mg/dL
Microalb Creat Ratio: 2.4 mg/g (ref 0.0–30.0)
Microalb, Ur: 3.5 mg/dL — ABNORMAL HIGH (ref 0.0–1.9)

## 2023-04-01 LAB — LIPID PANEL
Cholesterol: 147 mg/dL (ref 0–200)
HDL: 44.8 mg/dL (ref >39.00)
LDL Cholesterol: 91 mg/dL (ref 0–99)
NonHDL: 102.61
Total CHOL/HDL Ratio: 3
Triglycerides: 57 mg/dL (ref 0.0–149.0)
VLDL: 11.4 mg/dL (ref 0.0–40.0)

## 2023-04-01 LAB — HEMOGLOBIN A1C: Hgb A1c MFr Bld: 7.8 % — ABNORMAL HIGH (ref 4.6–6.5)

## 2023-04-01 MED ORDER — ALBUTEROL SULFATE HFA 108 (90 BASE) MCG/ACT IN AERS: 1.0000 | INHALATION_SPRAY | Freq: Four times a day (QID) | RESPIRATORY_TRACT | 0 refills | Status: DC | PRN

## 2023-04-01 MED ORDER — PREDNISONE 20 MG PO TABS: 40.0000 mg | ORAL_TABLET | Freq: Every day | ORAL | 0 refills | Status: DC

## 2023-04-01 MED ORDER — DOXYCYCLINE HYCLATE 100 MG PO TABS: 100.0000 mg | ORAL_TABLET | Freq: Two times a day (BID) | ORAL | 0 refills | Status: DC

## 2023-04-01 MED ORDER — FLUTICASONE PROPIONATE 50 MCG/ACT NA SUSP: 2.0000 | Freq: Every day | NASAL | 3 refills | Status: DC | PRN

## 2023-04-01 NOTE — Assessment & Plan Note (Signed)
 She is stable on atorvastatin. She will continue atorvastatin 40mg  daily. Check lipid panel today.

## 2023-04-01 NOTE — Assessment & Plan Note (Signed)
 Noted on PET scan 12/14/22. Continue atorvastatin 40mg  daily and aspirin 81mg  daily.

## 2023-04-01 NOTE — Assessment & Plan Note (Signed)
-   Continue jardiance 10 mg daily

## 2023-04-01 NOTE — Patient Instructions (Signed)
 It was great to see you!  We are checking your labs today and will let you know the results via mychart/phone.   [facility] - you can call and see if they still recommend colonoscopies: [phone]  Start doxycycline  twice a day for 10 days  Start prednisone  2 tablets in the morning with food for 5 days   Let's follow-up in 6 months, sooner if you have concerns.  If a referral was placed today, you will be contacted for an appointment. Please note that routine referrals can sometimes take up to 3-4 weeks to process. Please call our office if you haven't heard anything after this time frame.  Take care,  Tinnie Harada, NP

## 2023-04-01 NOTE — Assessment & Plan Note (Signed)
 Continue ozempic 2mg  injection weekly.

## 2023-04-01 NOTE — Assessment & Plan Note (Signed)
 Her diverticulitis exacerbated after consuming a traditional Chinese tea. Since she stopped the tea, her abdominal pain and diarrhea has improved.

## 2023-04-01 NOTE — Progress Notes (Signed)
 Established Patient Office Visit  Subjective   Patient ID: Bonnie Reed, female    DOB: 05-Jul-1945  Age: 78 y.o. MRN: [id number]  Chief Complaint  Patient presents with   Sinusitis    With some congestion, issues with diverticulitis    HPI  Discussed the use of AI scribe software for clinical note transcription with the patient, who gave verbal consent to proceed.  History of Present Illness   The patient, with a history of diverticulitis, presents with persistent nasal congestion since Thanksgiving. Despite a course of amoxicillin , the symptoms have not improved. She has tried various over-the-counter remedies, including Mucinex, Alka Seltzer, Sudafed, and Zyrtec, with no relief. She also tried a traditional Chinese tea, which initially helped with the congestion but triggered her diverticulitis. The patient reports feeling unwell, with a lack of energy and decreased appetite. She also reports headaches and pain in her cheeks, suggesting possible sinus involvement.    The patient's blood sugars have been well controlled with Ozempic , and she uses insulin  as needed. She also reports a recent weight loss, now down to a size 12. She denies chest pain, shortness of breath, and neuropathy.       ROS See pertinent positives and negatives per HPI.    Objective:     BP 128/68 (BP Location: Right Arm, Patient Position: Sitting, Cuff Size: Small)   Pulse 75   Temp 97.8 F (36.6 C) (Oral)   Ht 5' 1 (1.549 m)   Wt 149 lb 12.8 oz (67.9 kg)   LMP 10/17/1991 (Exact Date)   SpO2 96%   BMI 28.30 kg/m  BP Readings from Last 3 Encounters:  04/01/23 128/68  03/07/23 139/84  01/12/23 126/68   Wt Readings from Last 3 Encounters:  04/01/23 149 lb 12.8 oz (67.9 kg)  03/07/23 145 lb (65.8 kg)  01/12/23 151 lb (68.5 kg)      Physical Exam Vitals and nursing note reviewed.  Constitutional:      General: She is not in acute distress.    Appearance: Normal appearance.  HENT:      Head: Normocephalic.     Right Ear: Tympanic membrane, ear canal and external ear normal.     Left Ear: Tympanic membrane, ear canal and external ear normal.     Nose:     Right Sinus: Maxillary sinus tenderness and frontal sinus tenderness present.     Left Sinus: Maxillary sinus tenderness and frontal sinus tenderness present.  Eyes:     Conjunctiva/sclera: Conjunctivae normal.  Cardiovascular:     Rate and Rhythm: Normal rate and regular rhythm.     Pulses: Normal pulses.     Heart sounds: Normal heart sounds.  Pulmonary:     Effort: Pulmonary effort is normal.     Breath sounds: Normal breath sounds.  Musculoskeletal:     Cervical back: Normal range of motion and neck supple. No tenderness.  Lymphadenopathy:     Cervical: No cervical adenopathy.  Skin:    General: Skin is warm.  Neurological:     General: No focal deficit present.     Mental Status: She is alert and oriented to person, place, and time.  Psychiatric:        Mood and Affect: Mood normal.        Behavior: Behavior normal.        Thought Content: Thought content normal.        Judgment: Judgment normal.    The 10-year ASCVD risk score (  Arnett DK, et al., 2019) is: 23.2%    Assessment & Plan:   Problem List Items Addressed This Visit       Cardiovascular and Mediastinum   Hypertension   Chronic, stable. Continue valsartan  80mg  daily.       Aortic atherosclerosis (HCC)   Noted on PET scan 12/14/22. Continue atorvastatin  40mg  daily and aspirin 81mg  daily.         Respiratory   Acute non-recurrent pansinusitis - Primary   Despite amoxicillin  treatment, she experiences persistent nasal congestion, headache, and facial pain. Over-the-counter remedies and a traditional Chinese tea were tried but the latter exacerbated her diverticulitis. We will start prednisone , 40mg  daily in the morning with food for 5 days, and doxycycline  100mg  twice a day for 10 days. She should continue Flonase , hot tea, and hot  showers for symptomatic relief.       Relevant Medications   fluticasone  (FLONASE ) 50 MCG/ACT nasal spray   predniSONE  (DELTASONE ) 20 MG tablet   doxycycline  (VIBRA -TABS) 100 MG tablet     Digestive   Acute diverticulitis   Her diverticulitis exacerbated after consuming a traditional Chinese tea. Since she stopped the tea, her abdominal pain and diarrhea has improved.         Endocrine   Type 2 diabetes mellitus with complication, with long-term current use of insulin  (HCC)   Chronic, stable. Her diabetes is well-controlled with Ozempic  2mg  injection weekly, jardiance  10mg  daily and as-needed insulin . Discussed prednisone  will increase her sugars temporarily. She will continue the current regimen and monitor blood sugars closely while on prednisone . Recent BMP and CBC reviewed. Check A1c and urine microalbumin today.       Relevant Orders   Hemoglobin A1c   Microalbumin / creatinine urine ratio     Other   Mixed hyperlipidemia   She is stable on atorvastatin . She will continue atorvastatin  40mg  daily. Check lipid panel today.       Relevant Orders   Lipid panel   Long-term current use of injectable noninsulin antidiabetic medication   Continue ozempic  2mg  injection weekly.       Long term current use of oral hypoglycemic drug   Continue jardiance  10mg  daily         Return in about 6 months (around 09/29/2023) for CPE.    Tinnie DELENA Harada, NP

## 2023-04-01 NOTE — Assessment & Plan Note (Signed)
 Chronic, stable. Her diabetes is well-controlled with Ozempic  2mg  injection weekly, jardiance  10mg  daily and as-needed insulin . Discussed prednisone  will increase her sugars temporarily. She will continue the current regimen and monitor blood sugars closely while on prednisone . Recent BMP and CBC reviewed. Check A1c and urine microalbumin today.

## 2023-04-01 NOTE — Assessment & Plan Note (Signed)
Chronic, stable. Continue valsartan 80mg daily. 

## 2023-04-01 NOTE — Assessment & Plan Note (Signed)
 Despite amoxicillin  treatment, she experiences persistent nasal congestion, headache, and facial pain. Over-the-counter remedies and a traditional Chinese tea were tried but the latter exacerbated her diverticulitis. We will start prednisone , 40mg  daily in the morning with food for 5 days, and doxycycline  100mg  twice a day for 10 days. She should continue Flonase , hot tea, and hot showers for symptomatic relief.

## 2023-04-07 NOTE — Progress Notes (Signed)
Cardiology Office Note:    Date:  04/08/2023   ID:  Bonnie Reed, DOB 11-12-1945, MRN [id number]  PCP:  Gerre Scull, NP   [facility] Providers Cardiologist:  Gypsy Balsam, MD     Referring MD: Gerre Scull, NP  CC: recent ED visit f/u   History of Present Illness:    Bonnie Reed is a 78 y.o. female with a hx of hypertension, HFpEF, CAD s/p LHC in 2012 revealing moderate stenosis of LAD, GERD, DM 2, anemia, depression, hyperlipidemia, breast cancer s/p mastectomy.  01/14/2023 Lexiscan low risk, no ischemia, no infarction 07/24/2021 Lexiscan low risk 07/14/2021 echo EF 60 to 65%, moderate concentric LVH, grade 1 DD, trivial MR with moderate mitral annular calcification, mild calcification of aortic valve with sclerosis present 02/04/2011 left heart cath at [facility] moderate disease in the LAD where the vessel begins, not suitable for intervention  Establish care with Dr. Bing Matter in May 2023 at the behest of her oncologist Dr. Myna Hidalgo for preoperative evaluation for upcoming mastectomy related to breast cancer.    Most recently she was evaluated by Dr. Bing Matter on 01/06/2023, she continued to have episodes of atypical chest pain.  She had undergone MRI of her spine was told this was the reason for her chest pain.  Decision was made to proceed with a stress evaluation which was low risk.  She presents today for follow-up after recent ED visit where she had a chest x-ray that revealed aortic atherosclerosis, that was understandably concerning for her.  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  She has no angina, or DOE.  We did talk about her test results as well as secondary prevention strategies. She denies chest pain, palpitations, dyspnea, pnd, orthopnea, n, v, dizziness, syncope, edema, weight gain, or early satiety.   Past Medical  History:  Diagnosis Date   Allergy    Anemia    during knee replacement 15 years ago   Blood transfusion without reported diagnosis    Breast cancer (HCC) 1997   Left Breast Cancer   Cancer (HCC)    Cervical spondylosis    CHF (congestive heart failure) (HCC)    Coronary artery disease involving native coronary artery of native heart without angina pectoris    COVID 04/2021   mild case   Diabetes mellitus without complication (HCC)    Diverticulosis    GERD (gastroesophageal reflux disease)    without esophagitis   History of breast cancer    History of kidney stones    Hyperlipidemia    Hypertension    Lumbar spondylosis    Osteoarthritis    right knee   Osteopenia due to cancer therapy 09/30/2021   Other eczema    Personal history of radiation therapy 1997   Left Breast Cancer   Recurrent major depressive disorder (HCC)    Thyroid goiter     Past Surgical History:  Procedure Laterality Date   APPENDECTOMY     BREAST BIOPSY Right 08/01/2021   BREAST LUMPECTOMY Left 1997   CESAREAN SECTION     CHOLECYSTECTOMY     EVACUATION BREAST HEMATOMA Left 07/10/2022   Procedure: DRAIN PLACEMENT LEFT MASTECTOMY CAVITY;  Surgeon: Griselda Miner, MD;  Location: [facility];  Service: General;  Laterality: Left;   HEMATOMA EVACUATION Left 03/12/2022   Procedure: PLACEMENT OF DRAIN  LEFT CHEST WALL;  Surgeon: Griselda Miner, MD;  Location: [facility] OR;  Service: General;  Laterality: Left;   KNEE ARTHROSCOPY Right    LESION REMOVAL Left 08/25/2021   Procedure: EXCISION SKIN LESION LEFT CHEST WALL;  Surgeon: Griselda Miner, MD;  Location: [facility] OR;  Service: General;  Laterality: Left;   REDUCTION MAMMAPLASTY Right 1998   SIMPLE MASTECTOMY WITH AXILLARY SENTINEL NODE BIOPSY Left 08/25/2021   Procedure: LEFT MASTECTOMY;  Surgeon: Griselda Miner, MD;  Location: [facility] OR;  Service: General;  Laterality: Left;   TOTAL HIP ARTHROPLASTY Right    TUBAL LIGATION      Current Medications: Current Meds   Medication Sig   albuterol (VENTOLIN HFA) 108 (90 Base) MCG/ACT inhaler Inhale 1-2 puffs into the lungs every 6 (six) hours as needed for wheezing or shortness of breath.   anastrozole (ARIMIDEX) 1 MG tablet Take 1 tablet (1 mg total) by mouth daily.   aspirin EC 81 MG tablet Take 81 mg by mouth in the morning.   BD PEN NEEDLE NANO 2ND GEN 32G X 4 MM MISC USE AS DIRECTED THREE TIMES DAILY   Blood Glucose Monitoring Suppl (GLUCOCOM BLOOD GLUCOSE MONITOR) DEVI 1 each by Other route daily.   Carboxymethylcell-Hypromellose (GENTEAL OP) Place 1 drop into both eyes daily as needed (dry eyes).   cetirizine (ZYRTEC) 10 MG tablet Take 10 mg by mouth daily as needed for allergies.   clobetasol ointment (TEMOVATE) 0.05 % Apply 1 Application topically as needed (rash).   Coenzyme Q10 (COQ10) 100 MG CAPS Take 100 mg by mouth 3 (three) times a week.   denosumab (PROLIA) 60 MG/ML SOSY injection Inject 60 mg into the skin every 6 (six) months.   esomeprazole (NEXIUM) 40 MG capsule Take 1 capsule (40 mg total) by mouth daily.   fluticasone (FLONASE) 50 MCG/ACT nasal spray Place 2 sprays into both nostrils daily as needed for allergies.   folic acid (FOLVITE) 1 MG tablet Take 1 tablet (1 mg total) by mouth daily.   gabapentin (NEURONTIN) 300 MG capsule Take 300 mg by mouth at bedtime.   glucose blood (ONETOUCH VERIO) test strip USE AS DIRECTED THREE TIMES DAILY   ibuprofen (ADVIL) 200 MG tablet Take 400 mg by mouth every 8 (eight) hours as needed (pain.).   JARDIANCE 10 MG TABS tablet Take 1 tablet by mouth once daily   LANTUS SOLOSTAR 100 UNIT/ML Solostar Pen INJECT 20 UNITS SUBCUTANEOUSLY TWICE DAILY   LINZESS 145 MCG CAPS capsule TAKE 1 CAPSULE BY MOUTH ONCE DAILY AS NEEDED FOR CONSTIPATION   nitroGLYCERIN (NITROSTAT) 0.4 MG SL tablet Place 1 tablet (0.4 mg total) under the tongue every 5 (five) minutes as needed for chest pain.   rosuvastatin (CRESTOR) 20 MG tablet Take 1 tablet (20 mg total) by mouth  daily. Take on Monday, Wednesday and Friday along with CoQ10   Semaglutide, 2 MG/DOSE, (OZEMPIC, 2 MG/DOSE,) 8 MG/3ML SOPN INJECT 2 MG INTO THE SKIN ONCE A WEEK   traMADol (ULTRAM) 50 MG tablet Take 1 tablet (50 mg total) by mouth every 6 (six) hours as needed. (Patient taking differently: Take 50 mg by mouth every 6 (six) hours as needed for moderate pain (pain score 4-6) or severe pain (pain score 7-10).)   traZODone (DESYREL) 50 MG tablet TAKE 1/2 TO 1 (ONE-HALF TO ONE) TABLET BY MOUTH AT BEDTIME AS NEEDED FOR SLEEP (Patient taking differently: Take 25-50 mg by mouth at bedtime as needed for sleep.)   valsartan (  DIOVAN) 80 MG tablet Take 1 tablet by mouth once daily   [DISCONTINUED] atorvastatin (LIPITOR) 40 MG tablet Take 1 tablet (40 mg total) by mouth daily.     Allergies:   Patient has no known allergies.   Social History   Socioeconomic History   Marital status: Divorced    Spouse name: Not on file   Number of children: 4   Years of education: Not on file   Highest education level: Not on file  Occupational History   Occupation: Retired  Tobacco Use   Smoking status: Never   Smokeless tobacco: Never  Vaping Use   Vaping status: Never Used  Substance and Sexual Activity   Alcohol use: No   Drug use: No   Sexual activity: Not Currently  Other Topics Concern   Not on file  Social History Narrative   Not on file   Social Drivers of Health   Financial Resource Strain: Low Risk  (04/20/2022)   Overall Financial Resource Strain (CARDIA)    Difficulty of Paying Living Expenses: Not hard at all  Food Insecurity: Food Insecurity Present (04/20/2022)   Hunger Vital Sign    Worried About Running Out of Food in the Last Year: Often true    Ran Out of Food in the Last Year: Often true  Transportation Needs: No Transportation Needs (04/20/2022)   PRAPARE - Administrator, Civil Service (Medical): No    Lack of Transportation (Non-Medical): No  Physical Activity:  Insufficiently Active (04/20/2022)   Exercise Vital Sign    Days of Exercise per Week: 4 days    Minutes of Exercise per Session: 30 min  Stress: No Stress Concern Present (04/20/2022)   [employer] of Occupational Health - Occupational Stress Questionnaire    Feeling of Stress : Not at all  Social Connections: Not on File (10/26/2018)   Received from [place], [place]   Social Connections    Social Connections and Isolation: 0     Family History: The patient's family history includes Cancer in her father; Dementia in her mother; Kidney disease in her mother; Stroke in her mother. There is no history of Colon cancer or Rectal cancer.  ROS:   Please see the history of present illness.    All other systems reviewed and are negative.  EKGs/Labs/Other Studies Reviewed:    The following studies were reviewed today: Cardiac Studies & Procedures     STRESS TESTS  MYOCARDIAL PERFUSION IMAGING 01/12/2023  Narrative   Findings are consistent with no ischemia and no infarction. The study is low risk.   No ST deviation was noted.   Left ventricular function is normal. Nuclear stress EF: 67%. The left ventricular ejection fraction is hyperdynamic (>65%). End diastolic cavity size is normal.   Prior study available for comparison from 07/24/2021.  ECHOCARDIOGRAM  ECHOCARDIOGRAM COMPLETE 07/14/2021  Narrative ECHOCARDIOGRAM REPORT    Patient Name:   Bonnie Reed Date of Exam: 07/14/2021 Medical Rec #:  [id number]          Height:       61.0 in Accession #:    [id number]         Weight:       176.0 lb Date of Birth:  [birth year]          BSA:          1.789 m Patient Age:    76 years           BP:  146/63 mmHg Patient Gender: F                  HR:           77 bpm. Exam Location:  Outpatient  Procedure: 2D Echo, Cardiac Doppler and Color Doppler  Indications:    CHF  History:        Patient has no prior history of Echocardiogram examinations. CHF; Risk  Factors:Hypertension and Diabetes.  Sonographer:    Cleatis Polka Referring Phys: [id number] PETER R ENNEVER   Sonographer Comments: Image acquisition challenging due to respiratory motion. Global longitudinal strain was attempted. IMPRESSIONS   1. Left ventricular ejection fraction, by estimation, is 60 to 65%. The left ventricle has normal function. The left ventricle has no regional wall motion abnormalities. There is moderate concentric left ventricular hypertrophy. Left ventricular diastolic parameters are consistent with Grade I diastolic dysfunction (impaired relaxation). 2. Right ventricular systolic function is normal. The right ventricular size is normal. Tricuspid regurgitation signal is inadequate for assessing PA pressure. 3. The mitral valve is abnormal. Trivial mitral valve regurgitation. Moderate mitral annular calcification. 4. The aortic valve is tricuspid. There is mild calcification of the aortic valve. There is mild thickening of the aortic valve. Aortic valve regurgitation is mild. Aortic valve sclerosis/calcification is present, without any evidence of aortic stenosis. 5. The inferior vena cava is normal in size with greater than 50% respiratory variability, suggesting right atrial pressure of 3 mmHg.  FINDINGS Left Ventricle: Left ventricular ejection fraction, by estimation, is 60 to 65%. The left ventricle has normal function. The left ventricle has no regional wall motion abnormalities. Global longitudinal strain performed but not reported based on interpreter judgement due to suboptimal tracking. The left ventricular internal cavity size was normal in size. There is moderate concentric left ventricular hypertrophy. Left ventricular diastolic parameters are consistent with Grade I diastolic dysfunction (impaired relaxation).  Right Ventricle: The right ventricular size is normal. No increase in right ventricular wall thickness. Right ventricular systolic function is normal.  Tricuspid regurgitation signal is inadequate for assessing PA pressure.  Left Atrium: Left atrial size was normal in size.  Right Atrium: Right atrial size was normal in size.  Pericardium: There is no evidence of pericardial effusion.  Mitral Valve: The mitral valve is abnormal. There is mild thickening of the mitral valve leaflet(s). There is mild calcification of the mitral valve leaflet(s). Moderate mitral annular calcification. Trivial mitral valve regurgitation.  Tricuspid Valve: The tricuspid valve is normal in structure. Tricuspid valve regurgitation is trivial.  Aortic Valve: The aortic valve is tricuspid. There is mild calcification of the aortic valve. There is mild thickening of the aortic valve. Aortic valve regurgitation is mild. Aortic valve sclerosis/calcification is present, without any evidence of aortic stenosis. Aortic valve peak gradient measures 8.9 mmHg.  Pulmonic Valve: The pulmonic valve was normal in structure. Pulmonic valve regurgitation is trivial.  Aorta: The aortic root and ascending aorta are structurally normal, with no evidence of dilitation.  Venous: The inferior vena cava is normal in size with greater than 50% respiratory variability, suggesting right atrial pressure of 3 mmHg.  IAS/Shunts: There is redundancy of the interatrial septum. The atrial septum is grossly normal.   LEFT VENTRICLE PLAX 2D LVIDd:         4.00 cm     Diastology LVIDs:         2.60 cm     LV e' medial:    4.03 cm/s LV PW:  1.40 cm     LV E/e' medial:  14.2 LV IVS:        1.20 cm     LV e' lateral:   4.35 cm/s LVOT diam:     2.00 cm     LV E/e' lateral: 13.2 LV SV:         67 LV SV Index:   37 LVOT Area:     3.14 cm  LV Volumes (MOD) LV vol d, MOD A2C: 80.0 ml LV vol d, MOD A4C: 64.2 ml LV vol s, MOD A2C: 30.9 ml LV vol s, MOD A4C: 24.6 ml LV SV MOD A2C:     49.1 ml LV SV MOD A4C:     64.2 ml LV SV MOD BP:      46.3 ml  RIGHT VENTRICLE             IVC RV  Basal diam:  3.20 cm     IVC diam: 1.10 cm RV Mid diam:    2.50 cm RV S prime:     12.00 cm/s TAPSE (M-mode): 2.1 cm  LEFT ATRIUM             Index        RIGHT ATRIUM           Index LA diam:        3.20 cm 1.79 cm/m   RA Area:     13.60 cm LA Vol (A2C):   39.8 ml 22.25 ml/m  RA Volume:   29.60 ml  16.55 ml/m LA Vol (A4C):   37.6 ml 21.02 ml/m LA Biplane Vol: 39.2 ml 21.91 ml/m AORTIC VALVE AV Area (Vmax): 2.05 cm AV Vmax:        149.00 cm/s AV Peak Grad:   8.9 mmHg LVOT Vmax:      97.30 cm/s LVOT Vmean:     76.900 cm/s LVOT VTI:       0.212 m  AORTA Ao Root diam: 2.80 cm Ao Asc diam:  2.80 cm  MITRAL VALVE MV Area (PHT): 3.07 cm    SHUNTS MV Decel Time: 247 msec    Systemic VTI:  0.21 m MV E velocity: 57.40 cm/s  Systemic Diam: 2.00 cm MV A velocity: 91.30 cm/s MV E/A ratio:  0.63  Bonnie Flatten MD Electronically signed by Bonnie Flatten MD Signature Date/Time: 07/14/2021/2:27:30 PM    Final              EKG:  EKG is not ordered today, reviewed EKG from ED visit on 07/05/2022, normal sinus rhythm with left axis deviation, heart rate 76 bpm, consistent with prior EKG tracings.  Recent Labs: 12/03/2022: ALT 6 03/07/2023: BUN 15; Creatinine, Ser 0.75; Hemoglobin 13.1; Platelets 261; Potassium 4.5; Sodium 138  Recent Lipid Panel    Component Value Date/Time   CHOL 147 04/01/2023 0912   CHOL 162 02/08/2019 1049   TRIG 57.0 04/01/2023 0912   HDL 44.80 04/01/2023 0912   HDL 41 02/08/2019 1049   CHOLHDL 3 04/01/2023 0912   VLDL 11.4 04/01/2023 0912   LDLCALC 91 04/01/2023 0912   LDLCALC 103 (H) 02/08/2019 1049     Risk Assessment/Calculations:                Physical Exam:    VS:  BP 128/79   Pulse 89   Ht 5\' 1"  (1.549 m)   Wt 151 lb (68.5 kg)   LMP 10/17/1991 (Exact Date)   SpO2 99%   BMI  28.53 kg/m     Wt Readings from Last 3 Encounters:  04/08/23 151 lb (68.5 kg)  04/01/23 149 lb 12.8 oz (67.9 kg)  03/07/23 145 lb (65.8 kg)      GEN: appears younger than stated age,  well nourished, well developed in no acute distress HEENT: Normal NECK: No JVD; No carotid bruits LYMPHATICS: No lymphadenopathy CARDIAC: RRR, no murmurs, rubs, gallops RESPIRATORY:  Clear to auscultation without rales, wheezing or rhonchi  ABDOMEN: Soft, non-tender, non-distended MUSCULOSKELETAL:  No edema; No deformity.  Left chest wall with bandage, exquisitely tender to palpation or movement. SKIN: Warm and dry NEUROLOGIC:  Alert and oriented x 3 PSYCHIATRIC:  Normal affect   ASSESSMENT:    1. Coronary artery disease involving native coronary artery of native heart without angina pectoris   2. Chronic heart failure with preserved ejection fraction (HCC)   3. Type 2 diabetes mellitus with complication, with long-term current use of insulin (HCC)   4. Mixed hyperlipidemia   5. Primary hypertension   6. Medication management     PLAN:    In order of problems listed above:  CAD -s/p LHC in 2012 revealing moderate stenosis of LAD.  She had a Lexiscan in October 2024 which was negative.  Continue aspirin 81 mg daily, currently having difficulty tolerating Lipitor so we will change her to Crestor, continue nitroglycerin as needed--has not needed.  Her visit today was for follow-up of aortic atherosclerosis that was noted on chest x-ray--we discussed that this was not a new finding as she has known coronary artery disease dating back to 2012.  Her blood pressure is excellently controlled, we can work on getting her cholesterol better.  She currently exercises 30 minutes every day by walking her dogs.  Chronic systolic heart failure-most recent echo in 2023 revealed EF 60 to 65%, grade 1 DD, moderate concentric LVH.  NYHA class I, euvolemic.  Continue Jardiance 10 mg daily, valsartan 80 mg daily.  Currently not on beta-blocker therapy, although it appears she was previously on metoprolol back in 2013.  Hypertension-blood pressure is well-controlled  today at 128/79, continue valsartan 80 mg daily.  Dyslipidemia-most recent LDL was slightly elevated at 91 on 04/01/2023, alkaline phosphatase has been elevated, ALT and AST are normal.  She is unable to tolerate Lipitor.  Will start her on Crestor 20 mg to take Monday Wednesday Friday along with co-Q10. Heart healthy diet and regular cardiovascular exercise encouraged.      Disposition-stop atorvastatin, start rosuvastatin 20 mg daily to take on Monday Wednesday Friday, take co-Q10 supplement with this.  Follow-up in 6 months.       Medication Adjustments/Labs and Tests Ordered: Current medicines are reviewed at length with the patient today.  Concerns regarding medicines are outlined above.  Orders Placed This Encounter  Procedures   Lipid panel   Hepatic function panel   Meds ordered this encounter  Medications   rosuvastatin (CRESTOR) 20 MG tablet    Sig: Take 1 tablet (20 mg total) by mouth daily. Take on Monday, Wednesday and Friday along with CoQ10    Dispense:  36 tablet    Refill:  3    Patient Instructions  Medication Instructions:  Your physician has recommended you make the following change in your medication:  Stop Atorvastatin Start Rosuvastatin 20 mg 3 times weekly, M-W-F Get Over the counter supplement Co Q 10 100 mg to take along with the Rosuvastatin on M-W-F  *If you need a refill on your cardiac  medications before your next appointment, please call your pharmacy*   Lab Work: Your physician recommends that you return for lab work in: 8 Weeks (around March 13th) for fasting Liver Function and Lipid Panel Lab opens at 8am. You DO NOT NEED an appointment. Best time to come is between 8am and 11:45 and between 1:30 and 4:30. If you have been asked to fast for your blood work please have nothing to eat or drink after midnight. You may have water.   If you have labs (blood work) drawn today and your tests are completely normal, you will receive your results only  by: MyChart Message (if you have MyChart) OR A paper copy in the mail If you have any lab test that is abnormal or we need to change your treatment, we will call you to review the results.   Testing/Procedures: NONE   Follow-Up: At [facility], you and your health needs are our priority.  As part of our continuing mission to provide you with exceptional heart care, we have created designated Provider Care Teams.  These Care Teams include your primary Cardiologist (physician) and Advanced Practice Providers (APPs -  Physician Assistants and Nurse Practitioners) who all work together to provide you with the care you need, when you need it.  We recommend signing up for the patient portal called "MyChart".  Sign up information is provided on this After Visit Summary.  MyChart is used to connect with patients for Virtual Visits (Telemedicine).  Patients are able to view lab/test results, encounter notes, upcoming appointments, etc.  Non-urgent messages can be sent to your provider as well.   To learn more about what you can do with MyChart, go to [email or web address].    Your next appointment:   6 month(s)  Provider:   Gypsy Balsam, MD    Other Instructions          Signed, Flossie Dibble, NP  04/08/2023 11:32 AM     [facility]

## 2023-04-08 ENCOUNTER — Encounter: Payer: Self-pay | Admitting: Cardiology

## 2023-04-08 ENCOUNTER — Ambulatory Visit: Payer: Medicare HMO | Attending: Cardiology | Admitting: Cardiology

## 2023-04-08 VITALS — BP 128/79 | HR 89 | Ht 61.0 in | Wt 151.0 lb

## 2023-04-08 DIAGNOSIS — E118 Type 2 diabetes mellitus with unspecified complications: Secondary | ICD-10-CM

## 2023-04-08 DIAGNOSIS — Z79899 Other long term (current) drug therapy: Secondary | ICD-10-CM

## 2023-04-08 DIAGNOSIS — I2510 Atherosclerotic heart disease of native coronary artery without angina pectoris: Secondary | ICD-10-CM

## 2023-04-08 DIAGNOSIS — Z794 Long term (current) use of insulin: Secondary | ICD-10-CM

## 2023-04-08 DIAGNOSIS — I10 Essential (primary) hypertension: Secondary | ICD-10-CM

## 2023-04-08 DIAGNOSIS — I5032 Chronic diastolic (congestive) heart failure: Secondary | ICD-10-CM | POA: Diagnosis not present

## 2023-04-08 DIAGNOSIS — E782 Mixed hyperlipidemia: Secondary | ICD-10-CM

## 2023-04-08 MED ORDER — ROSUVASTATIN CALCIUM 20 MG PO TABS: 20.0000 mg | ORAL_TABLET | Freq: Every day | ORAL | 3 refills | Status: AC

## 2023-04-08 NOTE — Patient Instructions (Signed)
Medication Instructions:  Your physician has recommended you make the following change in your medication:  Stop Atorvastatin Start Rosuvastatin 20 mg 3 times weekly, M-W-F Get Over the counter supplement Co Q 10 100 mg to take along with the Rosuvastatin on M-W-F  *If you need a refill on your cardiac medications before your next appointment, please call your pharmacy*   Lab Work: Your physician recommends that you return for lab work in: 8 Weeks (around March 13th) for fasting Liver Function and Lipid Panel Lab opens at 8am. You DO NOT NEED an appointment. Best time to come is between 8am and 11:45 and between 1:30 and 4:30. If you have been asked to fast for your blood work please have nothing to eat or drink after midnight. You may have water.   If you have labs (blood work) drawn today and your tests are completely normal, you will receive your results only by: MyChart Message (if you have MyChart) OR A paper copy in the mail If you have any lab test that is abnormal or we need to change your treatment, we will call you to review the results.   Testing/Procedures: NONE   Follow-Up: At [facility] you and your health needs are our priority.  As part of our continuing mission to provide you with exceptional heart care, we have created designated Provider Care Teams.  These Care Teams include your primary Cardiologist (physician) and Advanced Practice Providers (APPs -  Physician Assistants and Nurse Practitioners) who all work together to provide you with the care you need, when you need it.  We recommend signing up for the patient portal called "MyChart".  Sign up information is provided on this After Visit Summary.  MyChart is used to connect with patients for Virtual Visits (Telemedicine).  Patients are able to view lab/test results, encounter notes, upcoming appointments, etc.  Non-urgent messages can be sent to your provider as well.   To learn more about what you can  do with MyChart, go to [email or web address].    Your next appointment:   6 month(s)  Provider:   Gypsy Balsam, MD    Other Instructions

## 2023-04-10 ENCOUNTER — Other Ambulatory Visit: Payer: Self-pay | Admitting: Nurse Practitioner

## 2023-04-13 NOTE — Telephone Encounter (Signed)
Requesting: BD NANO PEN NDL [id number] MIS  Last Visit: 04/01/2023 Next Visit: 09/29/2023 Last Refill: 03/09/2023  Please Advise

## 2023-04-16 DIAGNOSIS — K219 Gastro-esophageal reflux disease without esophagitis: Secondary | ICD-10-CM | POA: Diagnosis not present

## 2023-04-16 DIAGNOSIS — E11620 Type 2 diabetes mellitus with diabetic dermatitis: Secondary | ICD-10-CM | POA: Diagnosis not present

## 2023-04-16 DIAGNOSIS — M1990 Unspecified osteoarthritis, unspecified site: Secondary | ICD-10-CM | POA: Diagnosis not present

## 2023-04-16 DIAGNOSIS — I110 Hypertensive heart disease with heart failure: Secondary | ICD-10-CM | POA: Diagnosis not present

## 2023-04-16 DIAGNOSIS — R32 Unspecified urinary incontinence: Secondary | ICD-10-CM | POA: Diagnosis not present

## 2023-04-16 DIAGNOSIS — C50919 Malignant neoplasm of unspecified site of unspecified female breast: Secondary | ICD-10-CM | POA: Diagnosis not present

## 2023-04-16 DIAGNOSIS — Z008 Encounter for other general examination: Secondary | ICD-10-CM | POA: Diagnosis not present

## 2023-04-16 DIAGNOSIS — I209 Angina pectoris, unspecified: Secondary | ICD-10-CM | POA: Diagnosis not present

## 2023-04-16 DIAGNOSIS — Z8249 Family history of ischemic heart disease and other diseases of the circulatory system: Secondary | ICD-10-CM | POA: Diagnosis not present

## 2023-04-16 DIAGNOSIS — I509 Heart failure, unspecified: Secondary | ICD-10-CM | POA: Diagnosis not present

## 2023-04-16 DIAGNOSIS — Z794 Long term (current) use of insulin: Secondary | ICD-10-CM | POA: Diagnosis not present

## 2023-04-16 DIAGNOSIS — E1142 Type 2 diabetes mellitus with diabetic polyneuropathy: Secondary | ICD-10-CM | POA: Diagnosis not present

## 2023-04-16 DIAGNOSIS — Z7982 Long term (current) use of aspirin: Secondary | ICD-10-CM | POA: Diagnosis not present

## 2023-04-20 ENCOUNTER — Other Ambulatory Visit: Payer: Self-pay | Admitting: Nurse Practitioner

## 2023-04-20 DIAGNOSIS — E782 Mixed hyperlipidemia: Secondary | ICD-10-CM | POA: Diagnosis not present

## 2023-04-20 DIAGNOSIS — I5032 Chronic diastolic (congestive) heart failure: Secondary | ICD-10-CM | POA: Diagnosis not present

## 2023-04-20 DIAGNOSIS — I2510 Atherosclerotic heart disease of native coronary artery without angina pectoris: Secondary | ICD-10-CM | POA: Diagnosis not present

## 2023-04-20 DIAGNOSIS — I10 Essential (primary) hypertension: Secondary | ICD-10-CM | POA: Diagnosis not present

## 2023-04-20 DIAGNOSIS — Z79899 Other long term (current) drug therapy: Secondary | ICD-10-CM | POA: Diagnosis not present

## 2023-04-20 DIAGNOSIS — Z794 Long term (current) use of insulin: Secondary | ICD-10-CM | POA: Diagnosis not present

## 2023-04-20 DIAGNOSIS — E118 Type 2 diabetes mellitus with unspecified complications: Secondary | ICD-10-CM | POA: Diagnosis not present

## 2023-04-20 NOTE — Telephone Encounter (Signed)
Requesting: Albuterol Sulfate HFA 108 (90 Base) MCG/ACT Inhalation Aerosol Solution  Last Visit: 04/01/2023 Next Visit: 09/29/2023 Last Refill: 04/01/2023  Please Advise

## 2023-04-21 LAB — LIPID PANEL
Chol/HDL Ratio: 2 ratio (ref 0.0–4.4)
Cholesterol, Total: 93 mg/dL — ABNORMAL LOW (ref 100–199)
HDL: 47 mg/dL
LDL Chol Calc (NIH): 33 mg/dL (ref 0–99)
Triglycerides: 57 mg/dL (ref 0–149)
VLDL Cholesterol Cal: 13 mg/dL (ref 5–40)

## 2023-04-21 LAB — HEPATIC FUNCTION PANEL
ALT: 15 IU/L (ref 0–32)
AST: 18 IU/L (ref 0–40)
Albumin: 4.2 g/dL (ref 3.8–4.8)
Alkaline Phosphatase: 119 IU/L (ref 44–121)
Bilirubin Total: 0.3 mg/dL (ref 0.0–1.2)
Bilirubin, Direct: 0.13 mg/dL (ref 0.00–0.40)
Total Protein: 6.9 g/dL (ref 6.0–8.5)

## 2023-04-26 ENCOUNTER — Ambulatory Visit (INDEPENDENT_AMBULATORY_CARE_PROVIDER_SITE_OTHER): Payer: Medicare HMO

## 2023-04-26 DIAGNOSIS — Z0000 Encounter for general adult medical examination without abnormal findings: Secondary | ICD-10-CM | POA: Diagnosis not present

## 2023-04-26 NOTE — Patient Instructions (Signed)
Bonnie Reed , Thank you for taking time to come for your Medicare Wellness Visit. I appreciate your ongoing commitment to your health goals. Please review the following plan we discussed and let me know if I can assist you in the future.   Referrals/Orders/Follow-Ups/Clinician Recommendations: food insecurity  This is a list of the screening recommended for you and due dates:  Health Maintenance  Topic Date Due   Hemoglobin A1C  09/29/2023   Eye exam for diabetics  01/26/2024   Colon Cancer Screening  02/13/2024   Yearly kidney function blood test for diabetes  03/06/2024   Yearly kidney health urinalysis for diabetes  03/31/2024   Complete foot exam   03/31/2024   Medicare Annual Wellness Visit  04/25/2024   DTaP/Tdap/Td vaccine (4 - Td or Tdap) 11/14/2030   Pneumonia Vaccine  Completed   Flu Shot  Completed   DEXA scan (bone density measurement)  Completed   Hepatitis C Screening  Completed   Zoster (Shingles) Vaccine  Completed   HPV Vaccine  Aged Out   COVID-19 Vaccine  Discontinued    Advanced directives: (ACP Link)Information on Advanced Care Planning can be found at [facility] Advance Health Care Directives Advance Health Care Directives ([email or web address]   Next Medicare Annual Wellness Visit scheduled for next year: Yes  insert Preventive Care attachment Insert FALL PREVENTION attachment if needed

## 2023-04-26 NOTE — Progress Notes (Signed)
Subjective:   Bonnie Reed is a 78 y.o. female who presents for Medicare Annual (Subsequent) preventive examination.  Visit Complete: Virtual I connected with  [name] on 04/26/23 by a audio enabled telemedicine application and verified that I am speaking with the correct person using two identifiers.  Interactive audio and video telecommunications were attempted between this provider and patient, however failed, due to patient having technical difficulties OR patient did not have access to video capability.  We continued and completed visit with audio only.  Patient Location: Home  Provider Location: Office/Clinic  I discussed the limitations of evaluation and management by telemedicine. The patient expressed understanding and agreed to proceed.  Vital Signs: Because this visit was a virtual/telehealth visit, some criteria may be missing or patient reported. Any vitals not documented were not able to be obtained and vitals that have been documented are patient reported.    Cardiac Risk Factors include: advanced age (>40men, >38 women);diabetes mellitus;dyslipidemia;hypertension     Objective:    Today's Vitals   There is no height or weight on file to calculate BMI.     04/26/2023   10:14 AM 03/07/2023    7:55 AM 12/03/2022    9:48 AM 10/15/2022    9:26 AM 09/22/2022    9:21 AM 09/07/2022   10:56 AM 07/10/2022    6:50 AM  Advanced Directives  Does Patient Have a Medical Advance Directive? No No No No No No No  Would patient like information on creating a medical advance directive? No - Patient declined  No - Patient declined No - Patient declined No - Patient declined No - Patient declined No - Patient declined    Current Medications (verified) Outpatient Encounter Medications as of 04/26/2023  Medication Sig   albuterol (VENTOLIN HFA) 108 (90 Base) MCG/ACT inhaler INHALE 1 TO 2 PUFFS BY MOUTH EVERY 6 HOURS AS NEEDED FOR WHEEZING FOR SHORTNESS OF BREATH   anastrozole  (ARIMIDEX) 1 MG tablet Take 1 tablet (1 mg total) by mouth daily.   aspirin EC 81 MG tablet Take 81 mg by mouth in the morning.   Blood Glucose Monitoring Suppl (GLUCOCOM BLOOD GLUCOSE MONITOR) DEVI 1 each by Other route daily.   Carboxymethylcell-Hypromellose (GENTEAL OP) Place 1 drop into both eyes daily as needed (dry eyes).   cetirizine (ZYRTEC) 10 MG tablet Take 10 mg by mouth daily as needed for allergies.   clobetasol ointment (TEMOVATE) 0.05 % Apply 1 Application topically as needed (rash).   Coenzyme Q10 (COQ10) 100 MG CAPS Take 100 mg by mouth 3 (three) times a week.   denosumab (PROLIA) 60 MG/ML SOSY injection Inject 60 mg into the skin every 6 (six) months.   esomeprazole (NEXIUM) 40 MG capsule Take 1 capsule (40 mg total) by mouth daily.   fluticasone (FLONASE) 50 MCG/ACT nasal spray Place 2 sprays into both nostrils daily as needed for allergies.   folic acid (FOLVITE) 1 MG tablet Take 1 tablet (1 mg total) by mouth daily.   gabapentin (NEURONTIN) 300 MG capsule Take 300 mg by mouth at bedtime.   glucose blood (ONETOUCH VERIO) test strip USE AS DIRECTED THREE TIMES DAILY   ibuprofen (ADVIL) 200 MG tablet Take 400 mg by mouth every 8 (eight) hours as needed (pain.).   Insulin Pen Needle (BD PEN NEEDLE NANO 2ND GEN) 32G X 4 MM MISC USE AS DIRECTED THREE TIMES DAILY   JARDIANCE 10 MG TABS tablet Take 1 tablet by mouth once daily  LANTUS SOLOSTAR 100 UNIT/ML Solostar Pen INJECT 20 UNITS SUBCUTANEOUSLY TWICE DAILY   LINZESS 145 MCG CAPS capsule TAKE 1 CAPSULE BY MOUTH ONCE DAILY AS NEEDED FOR CONSTIPATION   nitroGLYCERIN (NITROSTAT) 0.4 MG SL tablet Place 1 tablet (0.4 mg total) under the tongue every 5 (five) minutes as needed for chest pain.   rosuvastatin (CRESTOR) 20 MG tablet Take 1 tablet (20 mg total) by mouth daily. Take on Monday, Wednesday and Friday along with CoQ10   Semaglutide, 2 MG/DOSE, (OZEMPIC, 2 MG/DOSE,) 8 MG/3ML SOPN INJECT 2 MG INTO THE SKIN ONCE A WEEK   traMADol  (ULTRAM) 50 MG tablet Take 1 tablet (50 mg total) by mouth every 6 (six) hours as needed. (Patient taking differently: Take 50 mg by mouth every 6 (six) hours as needed for moderate pain (pain score 4-6) or severe pain (pain score 7-10).)   traZODone (DESYREL) 50 MG tablet TAKE 1/2 TO 1 (ONE-HALF TO ONE) TABLET BY MOUTH AT BEDTIME AS NEEDED FOR SLEEP (Patient taking differently: Take 25-50 mg by mouth at bedtime as needed for sleep.)   valsartan (DIOVAN) 80 MG tablet Take 1 tablet by mouth once daily   No facility-administered encounter medications on file as of 04/26/2023.    Allergies (verified) Patient has no known allergies.   History: Past Medical History:  Diagnosis Date   Allergy    Anemia    during knee replacement 15 years ago   Blood transfusion without reported diagnosis    Breast cancer (HCC) 1997   Left Breast Cancer   Cancer (HCC)    Cervical spondylosis    CHF (congestive heart failure) (HCC)    Coronary artery disease involving native coronary artery of native heart without angina pectoris    COVID 04/2021   mild case   Diabetes mellitus without complication (HCC)    Diverticulosis    GERD (gastroesophageal reflux disease)    without esophagitis   History of breast cancer    History of kidney stones    Hyperlipidemia    Hypertension    Lumbar spondylosis    Osteoarthritis    right knee   Osteopenia due to cancer therapy 09/30/2021   Other eczema    Personal history of radiation therapy 1997   Left Breast Cancer   Recurrent major depressive disorder (HCC)    Thyroid goiter    Past Surgical History:  Procedure Laterality Date   APPENDECTOMY     BREAST BIOPSY Right 08/01/2021   BREAST LUMPECTOMY Left 1997   CESAREAN SECTION     CHOLECYSTECTOMY     EVACUATION BREAST HEMATOMA Left 07/10/2022   Procedure: DRAIN PLACEMENT LEFT MASTECTOMY CAVITY;  Surgeon: Griselda Miner, MD;  Location: [facility];  Service: General;  Laterality: Left;   HEMATOMA EVACUATION Left  03/12/2022   Procedure: PLACEMENT OF DRAIN LEFT CHEST WALL;  Surgeon: Griselda Miner, MD;  Location: [facility] OR;  Service: General;  Laterality: Left;   KNEE ARTHROSCOPY Right    LESION REMOVAL Left 08/25/2021   Procedure: EXCISION SKIN LESION LEFT CHEST WALL;  Surgeon: Griselda Miner, MD;  Location: [facility] OR;  Service: General;  Laterality: Left;   REDUCTION MAMMAPLASTY Right 1998   SIMPLE MASTECTOMY WITH AXILLARY SENTINEL NODE BIOPSY Left 08/25/2021   Procedure: LEFT MASTECTOMY;  Surgeon: Griselda Miner, MD;  Location: [facility] OR;  Service: General;  Laterality: Left;   TOTAL HIP ARTHROPLASTY Right    TUBAL LIGATION     Family History  Problem Relation Age  of Onset   Stroke Mother    Kidney disease Mother    Dementia Mother    Cancer Father        lung   Colon cancer Neg Hx    Rectal cancer Neg Hx    Social History   Socioeconomic History   Marital status: Divorced    Spouse name: Not on file   Number of children: 4   Years of education: Not on file   Highest education level: Not on file  Occupational History   Occupation: Retired  Tobacco Use   Smoking status: Never   Smokeless tobacco: Never  Vaping Use   Vaping status: Never Used  Substance and Sexual Activity   Alcohol use: No   Drug use: No   Sexual activity: Not Currently  Other Topics Concern   Not on file  Social History Narrative   Not on file   Social Drivers of Health   Financial Resource Strain: Low Risk  (04/26/2023)   Overall Financial Resource Strain (CARDIA)    Difficulty of Paying Living Expenses: Not hard at all  Food Insecurity: Food Insecurity Present (04/26/2023)   Hunger Vital Sign    Worried About Running Out of Food in the Last Year: Sometimes true    Ran Out of Food in the Last Year: Sometimes true  Transportation Needs: No Transportation Needs (04/26/2023)   PRAPARE - Administrator, Civil Service (Medical): No    Lack of Transportation (Non-Medical): No  Physical Activity: Sufficiently  Active (04/26/2023)   Exercise Vital Sign    Days of Exercise per Week: 7 days    Minutes of Exercise per Session: 30 min  Stress: No Stress Concern Present (04/26/2023)   [employer] of Occupational Health - Occupational Stress Questionnaire    Feeling of Stress : Not at all  Social Connections: Moderately Isolated (04/26/2023)   Social Connection and Isolation Panel [NHANES]    Frequency of Communication with Friends and Family: More than three times a week    Frequency of Social Gatherings with Friends and Family: More than three times a week    Attends Religious Services: More than 4 times per year    Active Member of [employer] or Organizations: No    Attends Engineer, structural: Never    Marital Status: Divorced    Tobacco Counseling Counseling given: Not Answered   Clinical Intake:  Pre-visit preparation completed: Yes  Pain : No/denies pain     Nutritional Risks: None Diabetes: Yes CBG done?: No Did pt. bring in CBG monitor from home?: No  How often do you need to have someone help you when you read instructions, pamphlets, or other written materials from your doctor or pharmacy?: 1 - Never  Interpreter Needed?: No  Information entered by :: NAllen LPN   Activities of Daily Living    04/26/2023   10:09 AM 07/08/2022    9:59 AM  In your present state of health, do you have any difficulty performing the following activities:  Hearing? 0   Vision? 0   Difficulty concentrating or making decisions? 0   Walking or climbing stairs? 0   Dressing or bathing? 0   Doing errands, shopping? 0 0  Preparing Food and eating ? N   Using the Toilet? N   In the past six months, have you accidently leaked urine? N   Comment wears a pad   Do you have problems with loss of bowel control? N  Managing your Medications? N   Managing your Finances? N   Housekeeping or managing your Housekeeping? N     Patient Care Team: Gerre Scull, NP as PCP - General (Internal  Medicine) Georgeanna Lea, MD as PCP - Cardiology (Cardiology) Josph Macho, MD as Medical Oncologist (Oncology) [facility], [facility]  Indicate any recent Medical Services you may have received from other than [facility] providers in the past year (date may be approximate).     Assessment:   This is a routine wellness examination for Tulsi.  Hearing/Vision screen Hearing Screening - Comments:: Denies hearing issues Vision Screening - Comments:: Regular eye exams, [facility]   Goals Addressed             This Visit's Progress    Patient Stated       04/26/2023, stay well       Depression Screen    04/26/2023   10:16 AM 08/24/2022   10:42 AM 04/20/2022    8:48 AM 03/03/2022    8:25 AM 10/24/2021    1:04 PM 11/22/2019    3:00 PM 06/21/2019    9:43 AM  PHQ 2/9 Scores  PHQ - 2 Score 0 0 0 0 2 0 0  PHQ- 9 Score 4    2      Fall Risk    04/26/2023   10:15 AM 04/01/2023    8:53 AM 08/24/2022   10:42 AM 04/20/2022    8:47 AM 03/03/2022    8:25 AM  Fall Risk   Falls in the past year? 0 0 0 0 0  Number falls in past yr: 0 0 0 0   Injury with Fall? 0 0 0 0   Risk for fall due to : Medication side effect No Fall Risks No Fall Risks Medication side effect No Fall Risks  Follow up Falls prevention discussed;Falls evaluation completed Falls evaluation completed Falls prevention discussed Falls prevention discussed;Education provided;Falls evaluation completed     MEDICARE RISK AT HOME: Medicare Risk at Home Any stairs in or around the home?: Yes If so, are there any without handrails?: No Home free of loose throw rugs in walkways, pet beds, electrical cords, etc?: Yes Adequate lighting in your home to reduce risk of falls?: Yes Life alert?: No Use of a cane, walker or w/c?: No Grab bars in the bathroom?: Yes Shower chair or bench in shower?: Yes Elevated toilet seat or a handicapped toilet?: Yes  TIMED UP AND GO:  Was the test performed?  No    Cognitive  Function:        04/26/2023   10:17 AM 04/20/2022    8:49 AM  6CIT Screen  What Year? 0 points 0 points  What month? 0 points 0 points  What time? 0 points 0 points  Count back from 20 0 points 0 points  Months in reverse 0 points 0 points  Repeat phrase 0 points 0 points  Total Score 0 points 0 points    Immunizations Immunization History  Administered Date(s) Administered   Fluad Quad(high Dose 65+) 12/03/2021   Fluzone Influenza virus vaccine,trivalent (IIV3), split virus 12/06/2009, 12/30/2010, 01/08/2012, 03/14/2014   IPV 07/10/1996   Influenza,inj,Quad PF,6+ Mos 04/21/2017, 01/20/2018, 02/08/2019   Influenza-Unspecified 04/21/2017, 01/20/2018, 11/19/2022   PFIZER Comirnaty(Gray Top)Covid-19 Tri-Sucrose Vaccine 07/23/2020   PFIZER(Purple Top)SARS-COV-2 Vaccination 05/15/2019, 06/05/2019, 01/04/2020, 07/23/2020   Pfizer Covid-19 Vaccine Bivalent Booster 64yrs & up 01/07/2021   Pneumococcal Conjugate-13 04/21/2017   Pneumococcal Polysaccharide-23 11/12/2009,  02/08/2019   Respiratory Syncytial Virus Vaccine,Recomb Aduvanted(Arexvy) 12/15/2021   Td 08/15/1996   Td (Adult),unspecified 02/28/2007   Tdap 11/13/2020   Zoster Recombinant(Shingrix) 11/13/2020, 01/31/2021    TDAP status: Up to date  Flu Vaccine status: Up to date  Pneumococcal vaccine status: Up to date  Covid-19 vaccine status: Information provided on how to obtain vaccines.   Qualifies for Shingles Vaccine? Yes   Zostavax completed Yes   Shingrix Completed?: Yes  Screening Tests Health Maintenance  Topic Date Due   HEMOGLOBIN A1C  09/29/2023   OPHTHALMOLOGY EXAM  01/26/2024   Colonoscopy  02/13/2024   Diabetic kidney evaluation - eGFR measurement  03/06/2024   Diabetic kidney evaluation - Urine ACR  03/31/2024   FOOT EXAM  03/31/2024   Medicare Annual Wellness (AWV)  04/25/2024   DTaP/Tdap/Td (4 - Td or Tdap) 11/14/2030   Pneumonia Vaccine 88+ Years old  Completed   INFLUENZA VACCINE  Completed    DEXA SCAN  Completed   Hepatitis C Screening  Completed   Zoster Vaccines- Shingrix  Completed   HPV VACCINES  Aged Out   COVID-19 Vaccine  Discontinued    Health Maintenance  There are no preventive care reminders to display for this patient.   Colorectal cancer screening: Type of screening: Colonoscopy. Completed 02/12/2021. Repeat every 3 years  Mammogram status: Completed 2024. Repeat every year  Bone Density status: Completed 09/30/2021.   Lung Cancer Screening: (Low Dose CT Chest recommended if Age 17-80 years, 20 pack-year currently smoking OR have quit w/in 15years.) does not qualify.   Lung Cancer Screening Referral: no  Additional Screening:  Hepatitis C Screening: does qualify; Completed 02/08/2019  Vision Screening: Recommended annual ophthalmology exams for early detection of glaucoma and other disorders of the eye. Is the patient up to date with their annual eye exam?  Yes  Who is the provider or what is the name of the office in which the patient attends annual eye exams? [facility] If pt is not established with a provider, would they like to be referred to a provider to establish care? No .   Dental Screening: Recommended annual dental exams for proper oral hygiene  Diabetic Foot Exam: Diabetic Foot Exam: Completed 04/01/2023  Community Resource Referral / Chronic Care Management: CRR required this visit?  Yes   CCM required this visit?  No     Plan:     I have personally reviewed and noted the following in the patient's chart:   Medical and social history Use of alcohol, tobacco or illicit drugs  Current medications and supplements including opioid prescriptions. Patient is not currently taking opioid prescriptions. Functional ability and status Nutritional status Physical activity Advanced directives List of other physicians Hospitalizations, surgeries, and ER visits in previous 12 months Vitals Screenings to include cognitive,  depression, and falls Referrals and appointments  In addition, I have reviewed and discussed with patient certain preventive protocols, quality metrics, and best practice recommendations. A written personalized care plan for preventive services as well as general preventive health recommendations were provided to patient.     Barb Merino, LPN   03/28/1094   After Visit Summary: (MyChart) Due to this being a telephonic visit, the after visit summary with patients personalized plan was offered to patient via MyChart   Nurse Notes: none

## 2023-04-27 ENCOUNTER — Telehealth: Payer: Self-pay | Admitting: *Deleted

## 2023-04-27 NOTE — Progress Notes (Signed)
 Complex Care Management Note  Care Guide Note 04/27/2023 Name: NEVENA ROZENBERG MRN: [id number] DOB: 09-Mar-1946  Mikel LITTIE Sanfilippo is a 78 y.o. year old female who sees McElwee, Lauren A, NP for primary care. I reached out to Mikel LITTIE Sanfilippo by phone today to offer complex care management services.  Ms. Zumbro was given information about Complex Care Management services today including:   The Complex Care Management services include support from the care team which includes your Nurse Care Manager, Clinical Social Worker, or Pharmacist.  The Complex Care Management team is here to help remove barriers to the health concerns and goals most important to you. Complex Care Management services are voluntary, and the patient may decline or stop services at any time by request to their care team member.   Complex Care Management Consent Status: Patient agreed to services and verbal consent obtained.   Follow up plan:  Telephone appointment with complex care management team member scheduled for:  04/28/23  Encounter Outcome:  Patient Scheduled  Harlene Satterfield  [facility]  [facility], [facility] Guide  Direct Dial: [phone]  Fax [phone]

## 2023-04-28 ENCOUNTER — Ambulatory Visit: Payer: Self-pay | Admitting: Licensed Clinical Social Worker

## 2023-04-28 NOTE — Patient Instructions (Signed)
 Visit Information  Thank you for taking time to visit with me today. Please don't hesitate to contact me if I can be of assistance to you.   Following are the goals we discussed today:   Goals Addressed             This Visit's Progress    Increase access to commnity resources       Care Coordination Interventions: Assessed Social Determinants of Health Reviewed all upcoming appointments in Epic system Motivational Interviewing employed Depression screen reviewed  Solution-Focused Strategies employed:  Active listening / Reflection utilized  Emotional Support Provided Problem Solving /Task Center strategies reviewed Contact/visit DSS office for food stamps application [facility] for [facility] opportunities/grief support group Remain active for physical/mental health purposes          Our next appointment is by telephone on 05/19/2023.  Please call the care guide team at [phone] if you need to cancel or reschedule your appointment.   If you are experiencing a Mental Health or Behavioral Health Crisis or need someone to talk to, please call the Suicide and Crisis Lifeline: 988  Patient verbalizes understanding of instructions and care plan provided today and agrees to view in MyChart. Active MyChart status and patient understanding of how to access instructions and care plan via MyChart confirmed with patient.     Telephone follow up appointment with care management team member scheduled for: 05/19/2023  Alm Armor, LCSW [place], [facility] Licensed Clinical Social Worker Care Coordinator [phone]

## 2023-04-28 NOTE — Patient Outreach (Signed)
  Care Coordination   Initial Visit Note   04/28/2023 Name: Bonnie Reed MRN: [id number] DOB: 1945/05/26  Bonnie Reed is a 78 y.o. year old female who sees McElwee, Lauren A, NP for primary care. I spoke with  Bonnie Reed by phone today.  What matters to the patients health and wellness today?  Increasing access to food resources and socialization opportunities.    Goals Addressed             This Visit's Progress    Increase access to commnity resources       Care Coordination Interventions: Assessed Social Determinants of Health Reviewed all upcoming appointments in Epic system Motivational Interviewing employed Depression screen reviewed  Solution-Focused Strategies employed:  Active listening / Reflection utilized  Emotional Support Provided Problem Solving /Task Center strategies reviewed Contact/visit DSS office for food stamps application [facility] opportunities/grief support group Remain active for physical/mental health purposes          SDOH assessments and interventions completed:  Yes  SDOH Interventions Today    Flowsheet Row Most Recent Value  SDOH Interventions   Food Insecurity Interventions Community Resources Provided  [Reviewed applying for food stamps, community food banks]  Housing Interventions Intervention Not Indicated  Transportation Interventions Intervention Not Indicated  [Reviewed community transportation options for future use.]  Utilities Interventions Intervention Not Indicated        Care Coordination Interventions:  Yes, provided   Interventions Today    Flowsheet Row Most Recent Value  Chronic Disease   Chronic disease during today's visit Other  [Community Resources]  General Interventions   General Interventions Discussed/Reviewed General Interventions Discussed, [employer]  [Pt reported some difficulty making her food last through each paycheck, pt previously applied  for food stamps but was denied d/t income. CSW encouraged her to reapply and we reviewed process - pt agreed.]  Education Interventions   Education Provided Provided Education  [CSW and pt discussed local senior center for socialization and exercise. Pt is member of [employer] but does not go regularly. Pt agreed to reach out to senior center - CSW provided contact information and activities/services provided.]  Provided Verbal Education On Nutrition  [Reviewed local food banks - CSW will e-mail list of local food banks tha are available.]  Mental Health Interventions   Mental Health Discussed/Reviewed Mental Health Discussed, Mental Health Reviewed, Coping Strategies, Depression, Anxiety  [Pt reported that a sister passed away in sept of last year - she has been experiencing some grief related to this. Pt tries to stay busy but cold weather during winter made this difficult. We discussed socialization through senior center and griefsupport]        Follow up plan: Follow up call scheduled for 05/19/23    Encounter Outcome:  Patient Visit Completed   Alm Armor, LCSW [place], [facility] Licensed Clinical Social Worker Care Coordinator [phone]

## 2023-05-17 ENCOUNTER — Other Ambulatory Visit: Payer: Self-pay | Admitting: Nurse Practitioner

## 2023-05-17 NOTE — Telephone Encounter (Signed)
 Requesting: Albuterol Sulfate HFA 108 (90 Base) MCG/ACT Inhalation Aerosol Solution  Last Visit: 04/01/2023 Next Visit: 09/29/2023 Last Refill: 04/20/2023  Please Advise

## 2023-05-19 ENCOUNTER — Ambulatory Visit: Payer: Self-pay | Admitting: Licensed Clinical Social Worker

## 2023-05-19 NOTE — Patient Outreach (Signed)
 Care Coordination   Follow Up Visit Note   05/19/2023 Name: Bonnie Reed MRN: [id number] DOB: Aug 19, 1945  Bonnie Reed is a 78 y.o. year old female who sees McElwee, Lauren A, NP for primary care. I spoke with  Bonnie Reed by phone today.  What matters to the patients health and wellness today?  Continuing to manage my symptoms of depression.    Goals Addressed             This Visit's Progress    COMPLETED: Increase access to commnity resources       Care Coordination Interventions: Assessed Social Determinants of Health Reviewed all upcoming appointments in Epic system Motivational Interviewing employed Depression screen reviewed  Solution-Focused Strategies employed:  Active listening / Reflection utilized  Emotional Support Provided Problem Solving /Task Center strategies reviewed Contact/visit DSS office for food stamps application [facility] for socialization opportunities/grief support group Remain active for physical/mental health purposes           SDOH assessments and interventions completed:  Yes     Care Coordination Interventions:  Yes, provided   Interventions Today    Flowsheet Row Most Recent Value  Chronic Disease   Chronic disease during today's visit Other  [Depression]  General Interventions   General Interventions Discussed/Reviewed [employer], General Interventions Discussed  [Pt reported that her energy bills have gotten much better this month and this has helped with her food security. We discussed energy asst programs she can utilize next year. Also reviewed food bank options.]  Exercise Interventions   Exercise Discussed/Reviewed Exercise Discussed  [Pt reported she has been much more active with the warmer weather, pt is going into her yard to start working on her garden. This has helped with her mental health.]  Mental Health Interventions   Mental Health Discussed/Reviewed Mental Health Discussed,  Mental Health Reviewed, Depression, Coping Strategies  [Pt reported the warmer weather has improved her mood, she is also spending more time with her children on the weekends. Pt reported she was in a depression and not reaching out for help. We discussed coping strat and montioring for depressive symptoms.]        Follow up plan: No further intervention required.   Encounter Outcome:  Patient Visit Completed   Kenton Kingfisher, LCSW [facility] Licensed Clinical Social Worker Care Coordinator [phone]

## 2023-05-19 NOTE — Patient Instructions (Signed)
 Visit Information  Thank you for taking time to visit with me today. Please don't hesitate to contact me if I can be of assistance to you.   Following are the goals we discussed today:   Goals Addressed             This Visit's Progress    COMPLETED: Increase access to commnity resources       Care Coordination Interventions: Assessed Social Determinants of Health Reviewed all upcoming appointments in Epic system Motivational Interviewing employed Depression screen reviewed  Solution-Focused Strategies employed:  Active listening / Reflection utilized  Emotional Support Provided Problem Solving /Task Center strategies reviewed Contact/visit DSS office for food stamps application [facility] for [facility] opportunities/grief support group Remain active for physical/mental health purposes            Please call the care guide team at [phone] if you need to cancel or reschedule your appointment.   If you are experiencing a Mental Health or Behavioral Health Crisis or need someone to talk to, please call the Suicide and Crisis Lifeline: 988  Patient verbalizes understanding of instructions and care plan provided today and agrees to view in MyChart. Active MyChart status and patient understanding of how to access instructions and care plan via MyChart confirmed with patient.     No further follow up required: Please contact your provider if you would like to connect.

## 2023-05-20 ENCOUNTER — Other Ambulatory Visit: Payer: Self-pay | Admitting: Nurse Practitioner

## 2023-05-20 NOTE — Telephone Encounter (Signed)
 Requesting: Ozempic (2 MG/DOSE) 8 MG/3ML Subcutaneous Solution Pen-injector  Last Visit: 04/01/2023 Next Visit: 09/29/2023 Last Refill: 03/31/2023  Please Advise

## 2023-05-30 ENCOUNTER — Other Ambulatory Visit: Payer: Self-pay | Admitting: Nurse Practitioner

## 2023-05-31 NOTE — Telephone Encounter (Signed)
 Requesting: Esomeprazole Magnesium 40 MG Oral Capsule Delayed Release  Last Visit: 04/01/2023 Next Visit: 09/29/2023 Last Refill: 06/09/2022  Please Advise

## 2023-06-03 ENCOUNTER — Encounter (HOSPITAL_BASED_OUTPATIENT_CLINIC_OR_DEPARTMENT_OTHER): Payer: Self-pay | Admitting: General Surgery

## 2023-06-03 ENCOUNTER — Ambulatory Visit: Payer: Self-pay | Admitting: General Surgery

## 2023-06-03 DIAGNOSIS — N6489 Other specified disorders of breast: Secondary | ICD-10-CM | POA: Diagnosis not present

## 2023-06-03 DIAGNOSIS — Z170 Estrogen receptor positive status [ER+]: Secondary | ICD-10-CM | POA: Diagnosis not present

## 2023-06-03 DIAGNOSIS — C50312 Malignant neoplasm of lower-inner quadrant of left female breast: Secondary | ICD-10-CM | POA: Diagnosis not present

## 2023-06-03 NOTE — Progress Notes (Signed)
 Reviewed chart with Dr. Bradley Ferris okay to proceed with procedure without further cardiac clearance.

## 2023-06-04 ENCOUNTER — Encounter (HOSPITAL_BASED_OUTPATIENT_CLINIC_OR_DEPARTMENT_OTHER)
Admission: RE | Admit: 2023-06-04 | Discharge: 2023-06-04 | Disposition: A | Discharge status: Home / Self Care | Source: Ambulatory Visit | Attending: General Surgery | Admitting: General Surgery

## 2023-06-04 DIAGNOSIS — Z01812 Encounter for preprocedural laboratory examination: Secondary | ICD-10-CM | POA: Insufficient documentation

## 2023-06-04 DIAGNOSIS — Z794 Long term (current) use of insulin: Secondary | ICD-10-CM | POA: Insufficient documentation

## 2023-06-04 DIAGNOSIS — E1169 Type 2 diabetes mellitus with other specified complication: Secondary | ICD-10-CM | POA: Insufficient documentation

## 2023-06-04 DIAGNOSIS — Z01818 Encounter for other preprocedural examination: Secondary | ICD-10-CM | POA: Diagnosis not present

## 2023-06-04 LAB — BASIC METABOLIC PANEL
Anion gap: 10 (ref 5–15)
BUN: 17 mg/dL (ref 8–23)
CO2: 24 mmol/L (ref 22–32)
Calcium: 9.6 mg/dL (ref 8.9–10.3)
Chloride: 106 mmol/L (ref 98–111)
Creatinine, Ser: 0.75 mg/dL (ref 0.44–1.00)
GFR, Estimated: >60 mL/min (ref >60)
Glucose, Bld: 130 mg/dL — ABNORMAL HIGH (ref 70–99)
Potassium: 4 mmol/L (ref 3.5–5.1)
Sodium: 140 mmol/L (ref 135–145)

## 2023-06-04 MED ORDER — CHLORHEXIDINE GLUCONATE CLOTH 2 % EX PADS: 6.0000 | MEDICATED_PAD | Freq: Once | CUTANEOUS | Status: DC

## 2023-06-04 NOTE — Progress Notes (Signed)
      Enhanced Recovery after Surgery  Enhanced Recovery after Surgery is a protocol used to improve the stress on your body and your recovery after surgery.  Patient Instructions  The night before surgery:  No food after midnight. ONLY clear liquids after midnight  The day of surgery (if you do NOT have diabetes):  Drink ONE (1) Pre-Surgery Clear Ensure as directed.   This drink was given to you during your hospital  pre-op appointment visit. The pre-op nurse will instruct you on the time to drink the  Pre-Surgery Ensure depending on your surgery time. Finish the drink at the designated time by the pre-op nurse.  Nothing else to drink after completing the  Pre-Surgery Clear Ensure.  The day of surgery (if you have diabetes): Drink ONE (1) Gatorade 2 (G2) as directed. This drink was given to you during your hospital  pre-op appointment visit.  The pre-op nurse will instruct you on the time to drink the   Gatorade 2 (G2) depending on your surgery time. Color of the Gatorade may vary. Red is not allowed. Nothing else to drink after completing the  Gatorade 2 (G2).         If you have questions, please contact your surgeon's office.  Surgical soap given with written instructions 

## 2023-06-07 ENCOUNTER — Other Ambulatory Visit: Payer: Self-pay | Admitting: Nurse Practitioner

## 2023-06-07 ENCOUNTER — Encounter (HOSPITAL_BASED_OUTPATIENT_CLINIC_OR_DEPARTMENT_OTHER)
Admission: RE | Disposition: A | Discharge status: Home / Self Care | Payer: Self-pay | Source: Home / Self Care | Attending: General Surgery

## 2023-06-07 ENCOUNTER — Other Ambulatory Visit: Payer: Self-pay

## 2023-06-07 ENCOUNTER — Ambulatory Visit (HOSPITAL_BASED_OUTPATIENT_CLINIC_OR_DEPARTMENT_OTHER)
Admission: RE | Admit: 2023-06-07 | Discharge: 2023-06-07 | Disposition: A | Discharge status: Home / Self Care | Attending: General Surgery | Admitting: General Surgery

## 2023-06-07 ENCOUNTER — Encounter (HOSPITAL_BASED_OUTPATIENT_CLINIC_OR_DEPARTMENT_OTHER): Payer: Self-pay | Admitting: General Surgery

## 2023-06-07 ENCOUNTER — Ambulatory Visit (HOSPITAL_BASED_OUTPATIENT_CLINIC_OR_DEPARTMENT_OTHER): Admitting: Anesthesiology

## 2023-06-07 DIAGNOSIS — S2020XA Contusion of thorax, unspecified, initial encounter: Secondary | ICD-10-CM | POA: Diagnosis not present

## 2023-06-07 DIAGNOSIS — Z7984 Long term (current) use of oral hypoglycemic drugs: Secondary | ICD-10-CM | POA: Diagnosis not present

## 2023-06-07 DIAGNOSIS — Z923 Personal history of irradiation: Secondary | ICD-10-CM | POA: Diagnosis not present

## 2023-06-07 DIAGNOSIS — Z833 Family history of diabetes mellitus: Secondary | ICD-10-CM | POA: Insufficient documentation

## 2023-06-07 DIAGNOSIS — C50312 Malignant neoplasm of lower-inner quadrant of left female breast: Secondary | ICD-10-CM | POA: Diagnosis not present

## 2023-06-07 DIAGNOSIS — Z7985 Long-term (current) use of injectable non-insulin antidiabetic drugs: Secondary | ICD-10-CM | POA: Diagnosis not present

## 2023-06-07 DIAGNOSIS — L7634 Postprocedural seroma of skin and subcutaneous tissue following other procedure: Secondary | ICD-10-CM | POA: Diagnosis not present

## 2023-06-07 DIAGNOSIS — Z853 Personal history of malignant neoplasm of breast: Secondary | ICD-10-CM | POA: Diagnosis not present

## 2023-06-07 DIAGNOSIS — Z79899 Other long term (current) drug therapy: Secondary | ICD-10-CM | POA: Diagnosis not present

## 2023-06-07 DIAGNOSIS — I509 Heart failure, unspecified: Secondary | ICD-10-CM | POA: Diagnosis not present

## 2023-06-07 DIAGNOSIS — Z794 Long term (current) use of insulin: Secondary | ICD-10-CM | POA: Diagnosis not present

## 2023-06-07 DIAGNOSIS — I110 Hypertensive heart disease with heart failure: Secondary | ICD-10-CM | POA: Diagnosis not present

## 2023-06-07 DIAGNOSIS — F32A Depression, unspecified: Secondary | ICD-10-CM | POA: Insufficient documentation

## 2023-06-07 DIAGNOSIS — L7633 Postprocedural seroma of skin and subcutaneous tissue following a dermatologic procedure: Secondary | ICD-10-CM | POA: Diagnosis not present

## 2023-06-07 DIAGNOSIS — E119 Type 2 diabetes mellitus without complications: Secondary | ICD-10-CM | POA: Insufficient documentation

## 2023-06-07 DIAGNOSIS — I2510 Atherosclerotic heart disease of native coronary artery without angina pectoris: Secondary | ICD-10-CM | POA: Diagnosis not present

## 2023-06-07 DIAGNOSIS — M1990 Unspecified osteoarthritis, unspecified site: Secondary | ICD-10-CM | POA: Diagnosis not present

## 2023-06-07 DIAGNOSIS — I10 Essential (primary) hypertension: Secondary | ICD-10-CM | POA: Insufficient documentation

## 2023-06-07 DIAGNOSIS — K219 Gastro-esophageal reflux disease without esophagitis: Secondary | ICD-10-CM | POA: Diagnosis not present

## 2023-06-07 HISTORY — PX: EVACUATION BREAST HEMATOMA: SHX1537

## 2023-06-07 LAB — GLUCOSE, CAPILLARY
Glucose-Capillary: 83 mg/dL (ref 70–99)
Glucose-Capillary: 85 mg/dL (ref 70–99)

## 2023-06-07 SURGERY — EVACUATION, HEMATOMA, BREAST
Anesthesia: General | Site: Chest | Laterality: Left

## 2023-06-07 MED ORDER — OXYCODONE HCL 5 MG PO TABS: 5.0000 mg | ORAL_TABLET | Freq: Four times a day (QID) | ORAL | 0 refills | Status: AC | PRN

## 2023-06-07 MED ORDER — FENTANYL CITRATE (PF) 100 MCG/2ML IJ SOLN
INTRAMUSCULAR | Status: DC | PRN
Start: 2023-06-07 — End: 2023-06-07
  Administered 2023-06-07 (×2): 25 ug via INTRAVENOUS

## 2023-06-07 MED ORDER — FENTANYL CITRATE (PF) 100 MCG/2ML IJ SOLN
INTRAMUSCULAR | Status: AC
Start: 2023-06-07 — End: ?
  Filled 2023-06-07: qty 2

## 2023-06-07 MED ORDER — ONDANSETRON HCL 4 MG/2ML IJ SOLN
INTRAMUSCULAR | Status: DC | PRN
  Administered 2023-06-07: 4 mg via INTRAVENOUS

## 2023-06-07 MED ORDER — ONDANSETRON HCL 4 MG/2ML IJ SOLN
INTRAMUSCULAR | Status: AC
  Filled 2023-06-07: qty 2

## 2023-06-07 MED ORDER — ACETAMINOPHEN 500 MG PO TABS
1000.0000 mg | ORAL_TABLET | Freq: Once | ORAL | Status: AC
  Administered 2023-06-07: 1000 mg via ORAL

## 2023-06-07 MED ORDER — LACTATED RINGERS IV SOLN: INTRAVENOUS | Status: DC

## 2023-06-07 MED ORDER — OXYCODONE HCL 5 MG/5ML PO SOLN: 5.0000 mg | Freq: Once | ORAL | Status: AC | PRN

## 2023-06-07 MED ORDER — DEXAMETHASONE SODIUM PHOSPHATE 10 MG/ML IJ SOLN
INTRAMUSCULAR | Status: DC | PRN
  Administered 2023-06-07: 5 mg via INTRAVENOUS

## 2023-06-07 MED ORDER — OXYCODONE HCL 5 MG PO TABS
ORAL_TABLET | ORAL | Status: AC
  Filled 2023-06-07: qty 1

## 2023-06-07 MED ORDER — PROPOFOL 10 MG/ML IV BOLUS
INTRAVENOUS | Status: AC
  Filled 2023-06-07: qty 20

## 2023-06-07 MED ORDER — LIDOCAINE HCL (CARDIAC) PF 100 MG/5ML IV SOSY
PREFILLED_SYRINGE | INTRAVENOUS | Status: DC | PRN
  Administered 2023-06-07: 80 mg via INTRAVENOUS

## 2023-06-07 MED ORDER — BUPIVACAINE-EPINEPHRINE 0.25% -1:200000 IJ SOLN
INTRAMUSCULAR | Status: DC | PRN
  Administered 2023-06-07: 6 mL

## 2023-06-07 MED ORDER — BUPIVACAINE-EPINEPHRINE (PF) 0.25% -1:200000 IJ SOLN
INTRAMUSCULAR | Status: AC
  Filled 2023-06-07: qty 30

## 2023-06-07 MED ORDER — DEXAMETHASONE SODIUM PHOSPHATE 10 MG/ML IJ SOLN
INTRAMUSCULAR | Status: AC
  Filled 2023-06-07: qty 1

## 2023-06-07 MED ORDER — OXYCODONE HCL 5 MG PO TABS
5.0000 mg | ORAL_TABLET | Freq: Once | ORAL | Status: AC | PRN
  Administered 2023-06-07: 5 mg via ORAL

## 2023-06-07 MED ORDER — 0.9 % SODIUM CHLORIDE (POUR BTL) OPTIME
TOPICAL | Status: DC | PRN
  Administered 2023-06-07: 60 mL

## 2023-06-07 MED ORDER — PROPOFOL 10 MG/ML IV BOLUS
INTRAVENOUS | Status: DC | PRN
  Administered 2023-06-07: 120 mg via INTRAVENOUS

## 2023-06-07 MED ORDER — EPHEDRINE 5 MG/ML INJ
INTRAVENOUS | Status: AC
  Filled 2023-06-07: qty 5

## 2023-06-07 MED ORDER — FENTANYL CITRATE (PF) 100 MCG/2ML IJ SOLN
INTRAMUSCULAR | Status: AC
  Filled 2023-06-07: qty 2

## 2023-06-07 MED ORDER — ACETAMINOPHEN 500 MG PO TABS
ORAL_TABLET | ORAL | Status: AC
  Filled 2023-06-07: qty 2

## 2023-06-07 MED ORDER — DROPERIDOL 2.5 MG/ML IJ SOLN: 0.6250 mg | Freq: Once | INTRAMUSCULAR | Status: DC | PRN

## 2023-06-07 MED ORDER — EPHEDRINE SULFATE (PRESSORS) 50 MG/ML IJ SOLN
INTRAMUSCULAR | Status: DC | PRN
  Administered 2023-06-07: 5 mg via INTRAVENOUS

## 2023-06-07 MED ORDER — FENTANYL CITRATE (PF) 100 MCG/2ML IJ SOLN
25.0000 ug | INTRAMUSCULAR | Status: DC | PRN
  Administered 2023-06-07 (×3): 25 ug via INTRAVENOUS

## 2023-06-07 MED ORDER — LIDOCAINE 2% (20 MG/ML) 5 ML SYRINGE
INTRAMUSCULAR | Status: AC
  Filled 2023-06-07: qty 5

## 2023-06-07 SURGICAL SUPPLY — 43 items
BENZOIN TINCTURE PRP APPL 2/3 (GAUZE/BANDAGES/DRESSINGS) ×2 IMPLANT
BIOPATCH RED 1 DISK 7.0 (GAUZE/BANDAGES/DRESSINGS) IMPLANT
BLADE CLIPPER SURG (BLADE) ×1 IMPLANT
BLADE SURG 15 STRL LF DISP TIS (BLADE) ×1 IMPLANT
BNDG GAUZE DERMACEA FLUFF 4 (GAUZE/BANDAGES/DRESSINGS) IMPLANT
CANISTER SUCT 1200ML W/VALVE (MISCELLANEOUS) ×1 IMPLANT
CHLORAPREP W/TINT 26 (MISCELLANEOUS) ×1 IMPLANT
CLEANER CAUTERY TIP PAD (MISCELLANEOUS) ×1 IMPLANT
COVER BACK TABLE 60X90IN (DRAPES) ×1 IMPLANT
COVER MAYO STAND STRL (DRAPES) ×1 IMPLANT
DERMABOND ADVANCED .7 DNX12 (GAUZE/BANDAGES/DRESSINGS) IMPLANT
DRAIN CHANNEL 19F RND (DRAIN) IMPLANT
DRAPE LAPAROSCOPIC ABDOMINAL (DRAPES) IMPLANT
DRAPE LAPAROTOMY T 102X78X121 (DRAPES) ×1 IMPLANT
DRAPE UTILITY XL STRL (DRAPES) ×1 IMPLANT
DRSG TEGADERM 4X4.75 (GAUZE/BANDAGES/DRESSINGS) IMPLANT
ELECT REM PT RETURN 9FT ADLT (ELECTROSURGICAL) ×1 IMPLANT
ELECTRODE REM PT RTRN 9FT ADLT (ELECTROSURGICAL) ×1 IMPLANT
EVACUATOR SILICONE 100CC (DRAIN) IMPLANT
GAUZE 4X4 16PLY ~~LOC~~+RFID DBL (SPONGE) IMPLANT
GAUZE PAD ABD 8X10 STRL (GAUZE/BANDAGES/DRESSINGS) IMPLANT
GAUZE SPONGE 4X4 12PLY STRL LF (GAUZE/BANDAGES/DRESSINGS) ×2 IMPLANT
GLOVE BIO SURGEON STRL SZ7.5 (GLOVE) ×1 IMPLANT
GOWN STRL REUS W/ TWL LRG LVL3 (GOWN DISPOSABLE) ×2 IMPLANT
NDL HYPO 25X1 1.5 SAFETY (NEEDLE) ×1 IMPLANT
NEEDLE HYPO 25X1 1.5 SAFETY (NEEDLE) ×1 IMPLANT
NS IRRIG 1000ML POUR BTL (IV SOLUTION) IMPLANT
PACK BASIN DAY SURGERY FS (CUSTOM PROCEDURE TRAY) ×1 IMPLANT
PENCIL SMOKE EVACUATOR (MISCELLANEOUS) ×1 IMPLANT
SLEEVE SCD COMPRESS KNEE MED (STOCKING) ×1 IMPLANT
SPIKE FLUID TRANSFER (MISCELLANEOUS) IMPLANT
SPONGE T-LAP 18X18 ~~LOC~~+RFID (SPONGE) IMPLANT
STRIP CLOSURE SKIN 1/2X4 (GAUZE/BANDAGES/DRESSINGS) IMPLANT
SUT ETHILON 2 0 FS 18 (SUTURE) IMPLANT
SUT MON AB 4-0 PC3 18 (SUTURE) IMPLANT
SUT PROLENE 3 0 PS 2 (SUTURE) IMPLANT
SUT PROLENE 4 0 PS 2 18 (SUTURE) IMPLANT
SUT VICRYL 3-0 CR8 SH (SUTURE) IMPLANT
SYR CONTROL 10ML LL (SYRINGE) ×1 IMPLANT
TAPE CLOTH 3X10 TAN LF (GAUZE/BANDAGES/DRESSINGS) ×1 IMPLANT
TOWEL GREEN STERILE FF (TOWEL DISPOSABLE) ×2 IMPLANT
TUBE CONNECTING 20X1/4 (TUBING) ×1 IMPLANT
YANKAUER SUCT BULB TIP NO VENT (SUCTIONS) ×1 IMPLANT

## 2023-06-07 NOTE — Progress Notes (Signed)
 Last received oxycodone at 1123am written on dc paperwork.

## 2023-06-07 NOTE — Interval H&P Note (Signed)
 History and Physical Interval Note:  06/07/2023 9:28 AM  Brittanee L Brunner  has presented today for surgery, with the diagnosis of LEFT CHEST WALL SEROMA.  The various methods of treatment have been discussed with the patient and family. After consideration of risks, benefits and other options for treatment, the patient has consented to  Procedure(s) with comments: EVACUATION, HEMATOMA, BREAST (Left) - DRAIN PLACEMENT LEFT CHEST WALL as a surgical intervention.  The patient's history has been reviewed, patient examined, no change in status, stable for surgery.  I have reviewed the patient's chart and labs.  Questions were answered to the patient's satisfaction.     Bonnie Reed

## 2023-06-07 NOTE — H&P (Signed)
 MRN: [id number] DOB: [birth year] Subjective   Chief Complaint: Follow-up   History of Present Illness: Bonnie Reed is a 78 y.o. female who is seen today for left breast cancer. The patient is a 78 year old black female who is about 1-1/2 years status post left mastectomy for a T1 CNX left breast cancer that was ER and PR positive and HER2 negative with a Ki-67 of 15%. She tolerated the surgery well. She elected for mastectomy because of her previous history of breast cancer and radiation on that side. She is also about 7 months status post placement of a drain and a recurrent seroma along the left chest wall. She tolerated this well. She has had a recurrence of the swelling along the left chest wall. She still has a small recurrent seroma despite drainage. She reports tenderness at the area   Review of Systems: A complete review of systems was obtained from the patient. I have reviewed this information and discussed as appropriate with the patient. See HPI as well for other ROS.  ROS   Medical History: Past Medical History:  Diagnosis Date  Abnormal nuclear stress test 10/25  EAV:WUJW ischemia, mid and distal LAD, normal ef some mild distal anteror hypo  Anemia  Breast cancer (CMS/HHS-HCC)  sp lumpectomy and radiation  Depression  Diabetes mellitus type 2, uncomplicated (CMS/HHS-HCC)  Eczema, unspecified  Encounter for blood transfusion  Gastroesophageal reflux  Goiter  neck  Hx of cardiac cath  11/12:DRH:Mild moderate mid distal LAD small vessel, on the small end for a stent.  Hypertension  Kidney stones  Osteoarthritis  Thyroid goiter   Patient Active Problem List  Diagnosis  Osteoarthritis  Hypertension  Diabetes mellitus type 2, uncomplicated (CMS/HHS-HCC)  Depression  Encounter for blood transfusion  Kidney stones  Anemia  Eczema, unspecified  Thyroid goiter  Gastroesophageal reflux  Abnormal nuclear stress test  CAD (coronary artery disease)  Breast cancer  (CMS/HHS-HCC)  Cervicalgia  Lumbago  Cervical spondylosis without myelopathy  Lumbosacral spondylosis without myelopathy  Hx of cardiac cath  Malignant neoplasm of lower-inner quadrant of left breast in female, estrogen receptor positive (CMS/HHS-HCC)  Enlarged thyroid  Seroma of breast   Past Surgical History:  Procedure Laterality Date  APPENDECTOMY 03/23/1954  CHOLECYSTECTOMY 03/23/1970  Cyst removed (R) wrist 03/24/2003  Pseudomonas (R) foot 03/23/2005  BREAST LUMPECTOMY  CESAREAN SECTION  MASTECTOMY  right total hip replacement    Allergies  Allergen Reactions  Hay Fever & Allergy Relief [Chlorpheniramine-Phenylpropan] Itching  Chlorpheniramine Itching   Current Outpatient Medications on File Prior to Visit  Medication Sig Dispense Refill  cetirizine (ZYRTEC) 10 MG tablet Take 10 mg by mouth daily.  cholecalciferol, vitamin D3, (VITAMIN D3) 125 mcg (5,000 unit) tablet Take 1 tablet by mouth daily.  ciprofloxacin (CIPRO) 500 MG tablet  denosumab (PROLIA) 60 mg/mL inj syringe Inject subcutaneously  esomeprazole magnesium (NEXIUM) 10 mg packet Take 10 mg by mouth continuously as needed.  FLUoxetine (PROZAC) 20 MG tablet Take 20 mg by mouth daily.  fluticasone propionate (FLONASE) 50 mcg/actuation nasal spray Place 2 sprays into both nostrils once daily  folic acid (FOLVITE) 1 MG tablet Take by mouth  gabapentin (NEURONTIN) 300 MG capsule TAKE 1 CAPSULE BY MOUTH ONCE DAILY AT BEDTIME FOR 3 DAYS, AND THEN TAKE 1 CAPSULE BY MOUTH TWICE DAILY  glipiZIDE (GLUCOTROL) 10 MG tablet Take 10 mg by mouth 2 (two) times daily  hydroCHLOROthiazide (HYDRODIURIL) 25 MG tablet Take 25 mg by mouth once daily  HYDROcodone-acetaminophen (VICODIN) 5-500 mg tablet  insulin ASPART (NOVOLOG FLEXPEN) pen injector (concentration 100 units/mL) Inject subcutaneously  insulin DETEMIR (LEVEMIR FLEXPEN) pen injector (concentration 100 units/mL) Inject subcutaneously  JARDIANCE 10 mg tablet Take 10 mg  by mouth every morning  letrozole (FEMARA) 2.5 mg tablet Take by mouth  metFORMIN (GLUCOPHAGE) 1000 MG tablet Take 1,000 mg by mouth 2 (two) times daily with meals.  oxyCODONE (ROXICODONE) 5 MG immediate release tablet Take 1 tablet (5 mg total) by mouth every 4 (four) hours as needed for Pain 10 tablet 0  OZEMPIC 0.25 mg or 0.5 mg (2 mg/3 mL) pen injector INJECT 0.25MG  INTO THE SKIN ONCE A WEEK. START WITH 0.25MG  ONCE A WEEK FOR 4 WEEKS, THEN INCREASE TO 0.5MG  WEEKLY  valsartan (DIOVAN) 80 MG tablet Take 80 mg by mouth daily.  zolpidem (AMBIEN) 10 mg tablet Take 10 mg by mouth nightly as needed.   Current Facility-Administered Medications on File Prior to Visit  Medication Dose Route Frequency Provider Last Rate Last Admin  regadenoson (LEXISCAN) inj syringe 0.4 mg 0.4 mg Intravenous Once Alonna Buckler, MD   Family History  Problem Relation Age of Onset  Diabetes Mother 108  alive  Alzheimer's disease Mother  Stroke Mother  High blood pressure (Hypertension) Mother  Lung cancer Father 55  Deceased  Diabetes Sister  Depression Sister  Arthritis Sister  Asthma Sister  High blood pressure (Hypertension) Sister  Osteoporosis (Thinning of bones) Sister  Hyperthyroidism Child  Heart disease Other  Kidney disease Other  Osteoporosis (Thinning of bones) Other  Stroke Other  Arthritis Other  Cervical cancer Other  Diabetes Other  Myocardial Infarction (Heart attack) Other    Social History   Tobacco Use  Smoking Status Never  Smokeless Tobacco Not on file    Social History   Socioeconomic History  Marital status: Divorced  Tobacco Use  Smoking status: Never  Vaping Use  Vaping status: Never Used  Substance and Sexual Activity  Alcohol use: Yes  Comment: Occasional  Drug use: No   Social Drivers of Corporate investment banker Strain: Low Risk (04/26/2023)  Received [facility]  Overall Financial Resource Strain (CARDIA)  Difficulty of Paying Living  Expenses: Not hard at all  Food Insecurity: Food Insecurity Present (04/28/2023)  Received from [facility]  Hunger Vital Sign  Worried About Running Out of Food in the Last Year: Sometimes true  Ran Out of Food in the Last Year: Sometimes true  Transportation Needs: No Transportation Needs (04/28/2023)  Received from [facility] - Transportation  Lack of Transportation (Medical): No  Lack of Transportation (Non-Medical): No  Physical Activity: Sufficiently Active (04/26/2023)  Received from [facility]  Exercise Vital Sign  Days of Exercise per Week: 7 days  Minutes of Exercise per Session: 30 min  Stress: No Stress Concern Present (04/26/2023)  Received from [facility] - Occupational Stress Questionnaire  Feeling of Stress : Not at all  Social Connections: Moderately Isolated (04/26/2023)  Received from [facility]  Social Connection and Isolation Panel [NHANES]  Frequency of Communication with Friends and Family: More than three times a week  Frequency of Social Gatherings with Friends and Family: More than three times a week  Attends Religious Services: More than 4 times per year  Active Member of [employer] or Organizations: No  Attends Banker Meetings: Never  Marital Status: Divorced  Housing Stability: Unknown (06/03/2023)  Housing Stability Vital Sign  Homeless in the Last Year:  No   Objective:   Vitals:  PainSc: 2  PainLoc: Breast   There is no height or weight on file to calculate BMI.  Physical Exam Vitals reviewed.  Constitutional:  General: She is not in acute distress. Appearance: Normal appearance.  HENT:  Head: Normocephalic and atraumatic.  Right Ear: External ear normal.  Left Ear: External ear normal.  Nose: Nose normal.  Mouth/Throat:  Mouth: Mucous membranes are moist.  Pharynx: Oropharynx is clear.  Eyes:  General: No scleral icterus. Extraocular Movements: Extraocular movements intact.   Conjunctiva/sclera: Conjunctivae normal.  Pupils: Pupils are equal, round, and reactive to light.  Cardiovascular:  Rate and Rhythm: Normal rate and regular rhythm.  Pulses: Normal pulses.  Heart sounds: Normal heart sounds.  Pulmonary:  Effort: Pulmonary effort is normal. No respiratory distress.  Breath sounds: Normal breath sounds.  Abdominal:  General: Bowel sounds are normal.  Palpations: Abdomen is soft.  Tenderness: There is no abdominal tenderness.  Musculoskeletal:  General: No swelling, tenderness or deformity. Normal range of motion.  Cervical back: Normal range of motion and neck supple.  Skin: General: Skin is warm and dry.  Coloration: Skin is not jaundiced.  Neurological:  General: No focal deficit present.  Mental Status: She is alert and oriented to person, place, and time.  Psychiatric:  Mood and Affect: Mood normal.  Behavior: Behavior normal.     Breast: The left mastectomy incision is healing nicely with no sign of infection. There is a small seroma recurrence on the left chest wall  Labs, Imaging and Diagnostic Testing:  Assessment and Plan:   Diagnoses and all orders for this visit:  Malignant neoplasm of lower-inner quadrant of left breast in female, estrogen receptor positive (CMS/HHS-HCC)  Seroma of breast    The patient is about 1-1/2 years status post left mastectomy for breast cancer. She tolerated the surgery well. She is about 1 year status post placement of a drain for the recurrent seroma. She now has a recurrence of the fluid again. At this point I feel she needs replacement of the drain. I have discussed the risks and benefits of the surgery with her as well as some of the technical aspects and she understands and wishes to proceed. I think this could be done with just local and may be some IV sedation in the operating room in the next few days

## 2023-06-07 NOTE — Anesthesia Preprocedure Evaluation (Addendum)
 Anesthesia Evaluation  Patient identified by MRN, date of birth, ID band Patient awake    Reviewed: Allergy & Precautions, NPO status , Patient's Chart, lab work & pertinent test results  History of Anesthesia Complications Negative for: history of anesthetic complications  Airway Mallampati: II  TM Distance: >3 FB Neck ROM: Full    Dental no notable dental hx.    Pulmonary neg pulmonary ROS   Pulmonary exam normal        Cardiovascular hypertension, Pt. on medications + CAD and +CHF  Normal cardiovascular exam  TTE 06/2021: EF 60-65%, moderate LVH, grade I DD, mild AR     Neuro/Psych    Depression       GI/Hepatic Neg liver ROS,GERD  Medicated and Controlled,,  Endo/Other  diabetes, Type 2, Insulin Dependent, Oral Hypoglycemic Agents    Renal/GU negative Renal ROS  negative genitourinary   Musculoskeletal  (+) Arthritis ,    Abdominal   Peds  Hematology negative hematology ROS (+)   Anesthesia Other Findings LEFT CHEST WALL SEROMA  Reproductive/Obstetrics                              Anesthesia Physical Anesthesia Plan  ASA: 2  Anesthesia Plan: General   Post-op Pain Management: Tylenol PO (pre-op)*   Induction: Intravenous  PONV Risk Score and Plan: 3 and Treatment may vary due to age or medical condition, Ondansetron and Dexamethasone  Airway Management Planned: LMA  Additional Equipment: None  Intra-op Plan:   Post-operative Plan: Extubation in OR  Informed Consent: I have reviewed the patients History and Physical, chart, labs and discussed the procedure including the risks, benefits and alternatives for the proposed anesthesia with the patient or authorized representative who has indicated his/her understanding and acceptance.     Dental advisory given  Plan Discussed with: CRNA  Anesthesia Plan Comments:          Anesthesia Quick Evaluation

## 2023-06-07 NOTE — Op Note (Signed)
 06/07/2023  10:21 AM  PATIENT:  Bonnie Reed  78 y.o. female  PRE-OPERATIVE DIAGNOSIS:  LEFT CHEST WALL SEROMA  POST-OPERATIVE DIAGNOSIS:  LEFT CHEST WALL SEROMA  PROCEDURE:  Procedure(s) with comments: EVACUATION LEFT CHEST WALL SEROMA AND DRAIN PLACEMENT (Left) - DRAIN PLACEMENT LEFT CHEST WALL  SURGEON:  Surgeons and Role:    * Griselda Miner, MD - Primary  PHYSICIAN ASSISTANT:   ASSISTANTS: none   ANESTHESIA:   local and general  EBL:  5 mL   BLOOD ADMINISTERED:none  DRAINS: (1) Blake drain(s) in the prepectoral space    LOCAL MEDICATIONS USED:  MARCAINE     SPECIMEN:  No Specimen  DISPOSITION OF SPECIMEN:  N/A  COUNTS:  YES  TOURNIQUET:  * No tourniquets in log *  DICTATION: .Dragon Dictation  After informed consent was obtained the patient was brought to the operating room and placed in the supine position on the operating table.  After adequate induction of general anesthesia the patient's left chest wall was prepped with ChloraPrep, allowed to dry, and draped in usual sterile manner.  The lateral end of the old mastectomy incision was infiltrated with quarter percent Marcaine.  A small incision was made along the lateral edge of the old mastectomy incision with a 15 blade knife.  The incision was carried through the skin and subcutaneous tissue sharply with the electrocautery until the seroma cavity was entered.  The seroma cavity was completely evacuated.  Through this opening I was able to cauterize the capsule of this cavity using the Bovie.  Once this was complete then the cavity was coated with Arista.  A small chest wall incision was made below this area.  A tonsil clamp was placed through this opening into the seroma cavity and used to bring a 19 [place] round Blake drain into the seroma cavity.  The drain was anchored to the skin with a 2-0 nylon stitch.  The drain was laying nicely in the seroma cavity.  The lateral chest wall incision was then closed with  interrupted 4-0 Monocryl subcuticular stitches.  Dermabond dressings and sterile drain dressings were applied.  The drain was placed to bulb suction and there was a good seal.  The patient tolerated the procedure well.  At the end of the case all needle sponge and instrument counts were correct.  The patient was then awakened and taken to recovery in stable condition.  PLAN OF CARE: Discharge to home after PACU  PATIENT DISPOSITION:  PACU - hemodynamically stable.   Delay start of Pharmacological VTE agent (>24hrs) due to surgical blood loss or risk of bleeding: not applicable

## 2023-06-07 NOTE — Transfer of Care (Signed)
 Immediate Anesthesia Transfer of Care Note  Patient: Bonnie Reed  Procedure(s) Performed: EVACUATION LEFT BREAST SEROMA AND DRAIN PLACEMENT (Left: Chest)  Patient Location: PACU  Anesthesia Type:General  Level of Consciousness: drowsy  Airway & Oxygen Therapy: Patient Spontanous Breathing and Patient connected to face mask oxygen  Post-op Assessment: Report given to RN and Post -op Vital signs reviewed and stable  Post vital signs: Reviewed and stable  Last Vitals:  Vitals Value Taken Time  BP 163/63 06/07/23 1030  Temp    Pulse 75 06/07/23 1032  Resp 15 06/07/23 1032  SpO2 100 % 06/07/23 1032  Vitals shown include unfiled device data.  Last Pain:  Vitals:   06/07/23 0859  TempSrc: Oral  PainSc: 0-No pain      Patients Stated Pain Goal: 5 (06/07/23 0859)  Complications: No notable events documented.

## 2023-06-07 NOTE — Anesthesia Postprocedure Evaluation (Signed)
 Anesthesia Post Note  Patient: Bonnie Reed  Procedure(s) Performed: EVACUATION LEFT BREAST SEROMA AND DRAIN PLACEMENT (Left: Chest)     Patient location during evaluation: PACU Anesthesia Type: General Level of consciousness: awake and alert Pain management: pain level controlled Vital Signs Assessment: post-procedure vital signs reviewed and stable Respiratory status: spontaneous breathing, nonlabored ventilation and respiratory function stable Cardiovascular status: blood pressure returned to baseline Postop Assessment: no apparent nausea or vomiting Anesthetic complications: no   No notable events documented.  Last Vitals:  Vitals:   06/07/23 1040 06/07/23 1045  BP:  (!) 141/81  Pulse: 72 64  Resp: 17 11  Temp:    SpO2: 100% 100%    Last Pain:  Vitals:   06/07/23 1057  TempSrc:   PainSc: 5                  Shanda Howells

## 2023-06-07 NOTE — Anesthesia Procedure Notes (Signed)
 Procedure Name: LMA Insertion Date/Time: 06/07/2023 9:52 AM  Performed by: Lauralyn Primes, CRNAPre-anesthesia Checklist: Patient identified, Emergency Drugs available, Suction available and Patient being monitored Patient Re-evaluated:Patient Re-evaluated prior to induction Oxygen Delivery Method: Circle system utilized Preoxygenation: Pre-oxygenation with 100% oxygen Induction Type: IV induction Ventilation: Mask ventilation without difficulty LMA: LMA inserted LMA Size: 4.0 Number of attempts: 1 Airway Equipment and Method: Bite block Placement Confirmation: positive ETCO2 Tube secured with: Tape Dental Injury: Teeth and Oropharynx as per pre-operative assessment

## 2023-06-07 NOTE — Discharge Instructions (Signed)
  Post Anesthesia Home Care Instructions  Activity: Get plenty of rest for the remainder of the day. A responsible individual must stay with you for 24 hours following the procedure.  For the next 24 hours, DO NOT: -Drive a car -Advertising copywriter -Drink alcoholic beverages -Take any medication unless instructed by your physician -Make any legal decisions or sign important papers.  Meals: Start with liquid foods such as gelatin or soup. Progress to regular foods as tolerated. Avoid greasy, spicy, heavy foods. If nausea and/or vomiting occur, drink only clear liquids until the nausea and/or vomiting subsides. Call your physician if vomiting continues.  Special Instructions/Symptoms: Your throat may feel dry or sore from the anesthesia or the breathing tube placed in your throat during surgery. If this causes discomfort, gargle with warm salt water. The discomfort should disappear within 24 hours.  If you had a scopolamine patch placed behind your ear for the management of post- operative nausea and/or vomiting:  1. The medication in the patch is effective for 72 hours, after which it should be removed.  Wrap patch in a tissue and discard in the trash. Wash hands thoroughly with soap and water. 2. You may remove the patch earlier than 72 hours if you experience unpleasant side effects which may include dry mouth, dizziness or visual disturbances. 3. Avoid touching the patch. Wash your hands with soap and water after contact with the patch.     No tylenol until after 3:30pm if needed today.

## 2023-06-08 ENCOUNTER — Encounter (HOSPITAL_BASED_OUTPATIENT_CLINIC_OR_DEPARTMENT_OTHER): Payer: Self-pay | Admitting: General Surgery

## 2023-06-23 ENCOUNTER — Other Ambulatory Visit: Payer: Self-pay | Admitting: Nurse Practitioner

## 2023-06-23 NOTE — Telephone Encounter (Signed)
 Requesting: Valsartan 80 MG Oral Tablet  Last Visit: 04/01/2023 Next Visit: 09/29/2023 Last Refill: 12/31/2022  Please Advise

## 2023-06-25 ENCOUNTER — Other Ambulatory Visit: Payer: Self-pay | Admitting: Nurse Practitioner

## 2023-06-25 DIAGNOSIS — E118 Type 2 diabetes mellitus with unspecified complications: Secondary | ICD-10-CM

## 2023-06-25 NOTE — Telephone Encounter (Signed)
 Requesting: Jardiance 10 MG Oral Tablet  Last Visit: 04/01/2023 Next Visit: 09/29/2023 Last Refill: 03/30/2023  Please Advise

## 2023-07-01 ENCOUNTER — Encounter: Payer: Self-pay | Admitting: Nurse Practitioner

## 2023-07-06 ENCOUNTER — Other Ambulatory Visit: Payer: Self-pay | Admitting: Nurse Practitioner

## 2023-07-06 NOTE — Telephone Encounter (Signed)
 Requesting: Albuterol Sulfate HFA 108 (90 Base) MCG/ACT Inhalation Aerosol Solution  Last Visit: 04/01/2023 Next Visit: 09/29/2023 Last Refill: 05/17/2023  Please Advise

## 2023-07-10 ENCOUNTER — Other Ambulatory Visit: Payer: Self-pay | Admitting: Nurse Practitioner

## 2023-07-12 NOTE — Telephone Encounter (Signed)
 Requesting: Ozempic  (2 MG/DOSE) 8 MG/3ML Subcutaneous Solution Pen-injector  Last Visit: 04/01/2023 Next Visit: 09/29/2023 Last Refill: 05/20/2023  Please Advise

## 2023-07-14 ENCOUNTER — Other Ambulatory Visit: Payer: Self-pay | Admitting: Nurse Practitioner

## 2023-07-14 NOTE — Telephone Encounter (Signed)
 Requesting: Lantus  SoloStar 100 UNIT/ML Subcutaneous Solution Pen-injector  Last Visit: 04/01/2023 Next Visit: 09/29/2023 Last Refill: 06/07/2023  Please Advise

## 2023-07-25 ENCOUNTER — Other Ambulatory Visit: Payer: Self-pay | Admitting: Nurse Practitioner

## 2023-07-26 NOTE — Telephone Encounter (Signed)
 Requesting: Albuterol  Sulfate HFA 108 (90 Base) MCG/ACT Inhalation Aerosol Solution  Last Visit: 04/01/2023 Next Visit: 09/29/2023 Last Refill: 07/06/2023  Please Advise

## 2023-07-27 ENCOUNTER — Other Ambulatory Visit: Payer: Self-pay | Admitting: Nurse Practitioner

## 2023-07-27 DIAGNOSIS — E1122 Type 2 diabetes mellitus with diabetic chronic kidney disease: Secondary | ICD-10-CM

## 2023-07-27 NOTE — Telephone Encounter (Signed)
 Requesting: Bonnie Reed In Vitro Strip  Last Visit: 04/01/2023 Next Visit: 09/29/2023 Last Refill: 03/09/2023  Please Advise

## 2023-07-28 ENCOUNTER — Other Ambulatory Visit: Payer: Self-pay | Admitting: Cardiology

## 2023-07-28 DIAGNOSIS — Z170 Estrogen receptor positive status [ER+]: Secondary | ICD-10-CM | POA: Diagnosis not present

## 2023-07-28 DIAGNOSIS — C50312 Malignant neoplasm of lower-inner quadrant of left female breast: Secondary | ICD-10-CM | POA: Diagnosis not present

## 2023-07-28 DIAGNOSIS — D219 Benign neoplasm of connective and other soft tissue, unspecified: Secondary | ICD-10-CM | POA: Diagnosis not present

## 2023-07-29 NOTE — Progress Notes (Signed)
 [facility] [address] [place] [address] Dept: [phone] Dept Fax: [phone]  Acute Care Office Visit  Subjective:   Bonnie Reed Aug 16, 1945 07/30/2023  Chief Complaint  Patient presents with   Sinus Problem    Started a week ago getting worse     HPI: Discussed the use of AI scribe software for clinical note transcription with the patient, who gave verbal consent to proceed.  History of Present Illness   Bonnie Reed is a 78 year old female who presents with symptoms of a sinus infection.  She has been experiencing symptoms for about a week, with worsening sinus drainage causing nausea, sinus congestion, sneezing, and sinus pressure in the forehead and underneath the eyes. Earaches have been present, particularly on the left side, though this has improved.  She has attempted to manage her symptoms with over-the-counter medications, including Allegra and Flonase  nasal spray, but these have not provided relief. She also tried using a vapor rub, which was ineffective.       The following portions of the patient's history were reviewed and updated as appropriate: past medical history, past surgical history, family history, social history, allergies, medications, and problem list.   Patient Active Problem List   Diagnosis Date Noted   Aortic atherosclerosis (HCC) 04/01/2023   Long-term current use of injectable noninsulin antidiabetic medication 04/01/2023   Long term current use of oral hypoglycemic drug 04/01/2023   Acute diverticulitis 01/12/2023   B12 deficiency 03/03/2022   Insomnia 12/03/2021   Osteopenia due to cancer therapy 09/30/2021   Depression 07/22/2021   Anemia 07/22/2021   Malignant neoplasm of lower-inner quadrant of left breast in female, estrogen receptor positive (HCC) 07/07/2021   Right leg pain 02/08/2019   Hypochromic microcytic anemia 04/08/2018   Eczema 04/08/2018   Mixed  hyperlipidemia 04/08/2018   Primary osteoarthritis of right knee 02/01/2018   Acute non-recurrent pansinusitis 06/11/2017   Healthcare maintenance 04/21/2017   Rhinitis, chronic 03/22/2017   Type 2 diabetes mellitus with complication, with long-term current use of insulin  (HCC) 10/16/2016   Hypertension 10/16/2016   CHF (congestive heart failure) (HCC) 10/16/2016   Hypertrophy of nasal turbinates 06/16/2016   Calculus of kidney 03/31/2016   Abnormal nuclear stress test 03/31/2016   Hx of cardiac cath 03/31/2016   Gastroesophageal reflux disease without esophagitis 03/31/2016   Nontoxic goiter, unspecified 03/31/2016   Allergy to wheat 11/22/2014   Allergy to pollen 11/22/2014   Allergy to dog dander 11/22/2014   Cervical spondylosis without myelopathy 06/19/2011   Low back pain 06/19/2011   Lumbosacral spondylosis without myelopathy 06/19/2011   Neck pain 06/19/2011   CAD (coronary artery disease) 01/28/2011   Past Medical History:  Diagnosis Date   Allergy    Anemia    during knee replacement 15 years ago   Blood transfusion without reported diagnosis    Breast cancer (HCC) 1997   Left Breast Cancer   Cancer (HCC)    Cervical spondylosis    CHF (congestive heart failure) (HCC)    Coronary artery disease involving native coronary artery of native heart without angina pectoris    COVID 04/2021   mild case   Diabetes mellitus without complication (HCC)    Diverticulosis    GERD (gastroesophageal reflux disease)    without esophagitis   History of breast cancer    History of kidney stones    Hyperlipidemia    Hypertension    Lumbar spondylosis  Osteoarthritis    right knee   Osteopenia due to cancer therapy 09/30/2021   Other eczema    Personal history of radiation therapy 1997   Left Breast Cancer   Recurrent major depressive disorder (HCC)    Thyroid  goiter    Past Surgical History:  Procedure Laterality Date   APPENDECTOMY     BREAST BIOPSY Right 08/01/2021    BREAST LUMPECTOMY Left 1997   CESAREAN SECTION     CHOLECYSTECTOMY     EVACUATION BREAST HEMATOMA Left 07/10/2022   Procedure: DRAIN PLACEMENT LEFT MASTECTOMY CAVITY;  Surgeon: Caralyn Chandler, MD;  Location: [facility];  Service: General;  Laterality: Left;   EVACUATION BREAST HEMATOMA Left 06/07/2023   Procedure: EVACUATION LEFT BREAST SEROMA AND DRAIN PLACEMENT;  Surgeon: Caralyn Chandler, MD;  Location: [facility];  Service: General;  Laterality: Left;  DRAIN PLACEMENT LEFT CHEST WALL   HEMATOMA EVACUATION Left 03/12/2022   Procedure: PLACEMENT OF DRAIN LEFT CHEST WALL;  Surgeon: Caralyn Chandler, MD;  Location: [facility];  Service: General;  Laterality: Left;   KNEE ARTHROSCOPY Right    LESION REMOVAL Left 08/25/2021   Procedure: EXCISION SKIN LESION LEFT CHEST WALL;  Surgeon: Caralyn Chandler, MD;  Location: [facility];  Service: General;  Laterality: Left;   REDUCTION MAMMAPLASTY Right 1998   SIMPLE MASTECTOMY WITH AXILLARY SENTINEL NODE BIOPSY Left 08/25/2021   Procedure: LEFT MASTECTOMY;  Surgeon: Caralyn Chandler, MD;  Location: [facility];  Service: General;  Laterality: Left;   TOTAL HIP ARTHROPLASTY Right    TUBAL LIGATION     Family History  Problem Relation Age of Onset   Stroke Mother    Kidney disease Mother    Dementia Mother    Cancer Father        lung   Colon cancer Neg Hx    Rectal cancer Neg Hx     Current Outpatient Medications:    albuterol  (VENTOLIN  HFA) 108 (90 Base) MCG/ACT inhaler, INHALE 1 TO 2 PUFFS BY MOUTH EVERY 6 HOURS AS NEEDED FOR WHEEZING OR SHORTNESS OF BREATH, Disp: 9 g, Rfl: 1   anastrozole  (ARIMIDEX ) 1 MG tablet, Take 1 tablet (1 mg total) by mouth daily., Disp: 90 tablet, Rfl: 2   aspirin EC 81 MG tablet, Take 81 mg by mouth in the morning., Disp: , Rfl:    Blood Glucose Monitoring Suppl (GLUCOCOM BLOOD GLUCOSE MONITOR) DEVI, 1 each by Other route daily., Disp: , Rfl:    Carboxymethylcell-Hypromellose (GENTEAL OP), Place 1 drop into both eyes daily as  needed (dry eyes)., Disp: , Rfl:    cetirizine (ZYRTEC) 10 MG tablet, Take 10 mg by mouth daily as needed for allergies., Disp: , Rfl:    clobetasol ointment (TEMOVATE) 0.05 %, Apply 1 Application topically as needed (rash)., Disp: , Rfl:    Coenzyme Q10 (COQ10) 100 MG CAPS, Take 100 mg by mouth 3 (three) times a week., Disp: , Rfl:    denosumab  (PROLIA ) 60 MG/ML SOSY injection, Inject 60 mg into the skin every 6 (six) months., Disp: , Rfl:    doxycycline  (VIBRA -TABS) 100 MG tablet, Take 1 tablet (100 mg total) by mouth 2 (two) times daily for 7 days., Disp: 14 tablet, Rfl: 0   empagliflozin  (JARDIANCE ) 10 MG TABS tablet, Take 1 tablet by mouth once daily, Disp: 90 tablet, Rfl: 1   esomeprazole  (NEXIUM ) 40 MG capsule, Take 1 capsule by mouth once daily, Disp: 90 capsule, Rfl: 1   fluticasone  (  FLONASE ) 50 MCG/ACT nasal spray, Place 2 sprays into both nostrils daily as needed for allergies., Disp: 15.8 mL, Rfl: 3   folic acid  (FOLVITE ) 1 MG tablet, Take 1 tablet (1 mg total) by mouth daily., Disp: 30 tablet, Rfl: 11   glucose blood (ONETOUCH VERIO) test strip, USE AS DIRECTED THREE TIMES DAILY, Disp: 100 each, Rfl: 3   ibuprofen  (ADVIL ) 200 MG tablet, Take 400 mg by mouth every 8 (eight) hours as needed (pain.)., Disp: , Rfl:    Insulin  Pen Needle (BD PEN NEEDLE NANO 2ND GEN) 32G X 4 MM MISC, USE AS DIRECTED THREE TIMES DAILY, Disp: 100 each, Rfl: 3   LANTUS  SOLOSTAR 100 UNIT/ML Solostar Pen, INJECT 20 UNITS SUBCUTANEOUSLY TWICE DAILY, Disp: 15 mL, Rfl: 1   LINZESS  145 MCG CAPS capsule, TAKE 1 CAPSULE BY MOUTH ONCE DAILY AS NEEDED FOR CONSTIPATION, Disp: 30 capsule, Rfl: 0   nitroGLYCERIN  (NITROSTAT ) 0.4 MG SL tablet, Place 1 tablet (0.4 mg total) under the tongue every 5 (five) minutes as needed for chest pain., Disp: 25 tablet, Rfl: 1   oxyCODONE  (ROXICODONE ) 5 MG immediate release tablet, Take 1 tablet (5 mg total) by mouth every 6 (six) hours as needed for severe pain (pain score 7-10)., Disp: 10  tablet, Rfl: 0   predniSONE  (DELTASONE ) 20 MG tablet, Take 2 tablets (40 mg total) by mouth daily with breakfast for 5 days., Disp: 10 tablet, Rfl: 0   Semaglutide , 2 MG/DOSE, (OZEMPIC , 2 MG/DOSE,) 8 MG/3ML SOPN, INJECT 2MG  SUBCUTANEOUSLY ONCE WEEKLY, Disp: 3 mL, Rfl: 3   traMADol  (ULTRAM ) 50 MG tablet, Take 1 tablet (50 mg total) by mouth every 6 (six) hours as needed. (Patient taking differently: Take 50 mg by mouth every 6 (six) hours as needed for moderate pain (pain score 4-6) or severe pain (pain score 7-10).), Disp: 60 tablet, Rfl: 0   traZODone  (DESYREL ) 50 MG tablet, TAKE 1/2 TO 1 (ONE-HALF TO ONE) TABLET BY MOUTH AT BEDTIME AS NEEDED FOR SLEEP (Patient taking differently: Take 25-50 mg by mouth at bedtime as needed for sleep.), Disp: 90 tablet, Rfl: 0   valsartan  (DIOVAN ) 80 MG tablet, Take 1 tablet by mouth once daily, Disp: 90 tablet, Rfl: 0   gabapentin  (NEURONTIN ) 300 MG capsule, Take 300 mg by mouth at bedtime., Disp: , Rfl:    rosuvastatin  (CRESTOR ) 20 MG tablet, Take 1 tablet (20 mg total) by mouth daily. Take on Monday, Wednesday and Friday along with CoQ10, Disp: 36 tablet, Rfl: 3 No Known Allergies   ROS: A complete ROS was performed with pertinent positives/negatives noted in the HPI. The remainder of the ROS are negative.    Objective:   Today's Vitals   07/30/23 0810  BP: 124/64  Pulse: 74  Temp: 98 F (36.7 C)  TempSrc: Temporal  SpO2: 99%  Weight: 149 lb (67.6 kg)  Height: 5\' 1"  (1.549 m)    GENERAL: Well-appearing, in NAD. Well nourished.  SKIN: Pink, warm and dry. No rash.  HEENT:    HEAD: Normocephalic, non-traumatic.  EYES: Conjunctive pink without exudate. PERRL.  EARS: External ear w/o redness, swelling, masses, or lesions. EAC clear. TM's intact, translucent w/o bulging, appropriate landmarks visualized.  NOSE: Septum midline w/o deformity. Nares patent, mucosa pink and inflamed w/o drainage. Frontal and maxillary sinus tenderness.  THROAT: Uvula  midline. Oropharynx clear. Tonsils non-inflamed w/o exudate. Mucus membranes pink and moist.  NECK: Trachea midline. Full ROM w/o pain or tenderness. No lymphadenopathy.  RESPIRATORY: Chest wall symmetrical. Respirations  even and non-labored. Breath sounds clear to auscultation bilaterally.  CARDIAC: S1, S2 present, regular rate and rhythm. Peripheral pulses 2+ bilaterally.  EXTREMITIES: Without clubbing, cyanosis, or edema.  NEUROLOGIC: Steady, even gait.  PSYCH/MENTAL STATUS: Alert, oriented x 3. Cooperative, appropriate mood and affect.    No results found for any visits on 07/30/23.    Assessment & Plan:  Assessment and Plan    Acute sinusitis Symptoms persistent for one week, worsening. Previous Allegra and Flonase  ineffective. Doxycycline  and prednisone  effective in past. Prednisone  may elevate blood glucose. - Prescribe doxycycline  100 mg, one tablet twice daily for 7 days. - Prescribe prednisone  40 mg, two tablets once daily for 5 days. - Continue Allegra and Flonase . - Recommend NeilMed neti pot for sinus rinse. - Send prescriptions to [facility] on [address].       Meds ordered this encounter  Medications   doxycycline  (VIBRA -TABS) 100 MG tablet    Sig: Take 1 tablet (100 mg total) by mouth 2 (two) times daily for 7 days.    Dispense:  14 tablet    Refill:  0    Supervising Provider:   THOMPSON, AARON B [[id number]]   predniSONE  (DELTASONE ) 20 MG tablet    Sig: Take 2 tablets (40 mg total) by mouth daily with breakfast for 5 days.    Dispense:  10 tablet    Refill:  0    Supervising Provider:   THOMPSON, AARON B [[id number]]   No orders of the defined types were placed in this encounter.  Lab Orders  No laboratory test(s) ordered today   No images are attached to the encounter or orders placed in the encounter.  Return if symptoms worsen or fail to improve.   Bonnie Kast, FNP

## 2023-07-30 ENCOUNTER — Ambulatory Visit (INDEPENDENT_AMBULATORY_CARE_PROVIDER_SITE_OTHER): Admitting: Internal Medicine

## 2023-07-30 ENCOUNTER — Encounter: Payer: Self-pay | Admitting: Internal Medicine

## 2023-07-30 VITALS — BP 124/64 | HR 74 | Temp 98.0°F | Ht 61.0 in | Wt 149.0 lb

## 2023-07-30 DIAGNOSIS — J0140 Acute pansinusitis, unspecified: Secondary | ICD-10-CM

## 2023-07-30 MED ORDER — DOXYCYCLINE HYCLATE 100 MG PO TABS: 100.0000 mg | ORAL_TABLET | Freq: Two times a day (BID) | ORAL | 0 refills | Status: AC

## 2023-07-30 MED ORDER — PREDNISONE 20 MG PO TABS: 40.0000 mg | ORAL_TABLET | Freq: Every day | ORAL | 0 refills | Status: AC

## 2023-07-30 NOTE — Patient Instructions (Signed)
 SINUS RINSE INSTRUCTIONS Step 1 Please wash your hands. Fill the clean bottle with the designated volume of warmed distilled, microfiltered (through 0.2 micron filter), reverse osmosis filtered, or commercially bottled water.  Step 2 Cut the Sinus RinseT mixture packet at the corner and pour its contents into the bottle. Tighten the cap and tube on the bottle securely. Place one finger over the tip of the cap and shake the bottle gently to dissolve the mixture. Always rinse your nasal passages with NeilMed SINUS RINSE packets only.  Step 3 Standing in front of a sink, bend forward to your comfort level and tilt your head down. Keeping your mouth open, without holding your breath, place the cap snugly against your nasal passage. SQUEEZE BOTTLE GENTLY until the solution starts draining from the OPPOSITE nasal passage. Some may drain from your mouth. For a proper rinse, keep squeezing the bottle GENTLY until at least 1/4 to 1/2 (60 mL to 120 mL or 2 to 4 fl oz) of the bottle is used. Do not swallow the solution.  Step 4 Blow your nose very gently, without pinching nose completely to avoid pressure on eardrums. If tolerable, sniff in gently any residual solution remaining in the nasal passage once or twice, because this may clean out the posterior nasopharyngeal area, which is the area at the back of your nasal passage. At times, some solution will reach the back of your throat, so please spit it out. To help drain any residual solution, blow your nose gently while tilting your head forward and to the opposite side of the nasal passage you just rinsed.  Step 5 Now repeat steps 3 and 4 for your other nasal passage. Most users find that rinsing twice a day is beneficial, similar to brushing your teeth. Many doctors recommend rinsing 3-4 times daily or for special circumstances, even rinsing up to 6 times a day is safe. Please follow your physician's advice.  Step 6 Clean the bottle and cap. Air dry the  Sinus RinseT bottle, cap, and tube on a clean paper towel, a lint free towel, or use NeilMed NasaDOCK or NasaDOCK plusT (sold separately) to store the bottle, cap and tube

## 2023-08-26 ENCOUNTER — Other Ambulatory Visit: Payer: Self-pay | Admitting: Nurse Practitioner

## 2023-08-26 DIAGNOSIS — H524 Presbyopia: Secondary | ICD-10-CM | POA: Diagnosis not present

## 2023-08-26 DIAGNOSIS — H5213 Myopia, bilateral: Secondary | ICD-10-CM | POA: Diagnosis not present

## 2023-08-26 DIAGNOSIS — H52209 Unspecified astigmatism, unspecified eye: Secondary | ICD-10-CM | POA: Diagnosis not present

## 2023-08-26 NOTE — Telephone Encounter (Signed)
 Requesting:  BD NANO PEN NDL 32GX4MM MIS  Last Visit: 04/01/2023 Next Visit: 09/29/2023 Last Refill: 04/13/2023  Please Advise

## 2023-08-30 DIAGNOSIS — S7001XA Contusion of right hip, initial encounter: Secondary | ICD-10-CM | POA: Diagnosis not present

## 2023-08-30 DIAGNOSIS — Z96641 Presence of right artificial hip joint: Secondary | ICD-10-CM | POA: Diagnosis not present

## 2023-09-14 ENCOUNTER — Other Ambulatory Visit: Payer: Self-pay | Admitting: Nurse Practitioner

## 2023-09-21 ENCOUNTER — Other Ambulatory Visit: Payer: Self-pay | Admitting: Nurse Practitioner

## 2023-09-27 ENCOUNTER — Other Ambulatory Visit: Payer: Self-pay | Admitting: Nurse Practitioner

## 2023-09-28 NOTE — Telephone Encounter (Signed)
 Requesting: Lantus  SoloStar 100 UNIT/ML Subcutaneous Solution Pen-injector  Last Visit: 04/01/2023 Next Visit: 09/29/2023 Last Refill: 07/14/2023  Please Advise

## 2023-09-29 ENCOUNTER — Ambulatory Visit: Payer: Medicare HMO | Admitting: Nurse Practitioner

## 2023-09-29 ENCOUNTER — Ambulatory Visit: Payer: Self-pay | Admitting: Nurse Practitioner

## 2023-09-29 ENCOUNTER — Encounter: Payer: Self-pay | Admitting: Nurse Practitioner

## 2023-09-29 VITALS — BP 140/80 | HR 71 | Temp 97.1°F | Ht 61.0 in | Wt 143.2 lb

## 2023-09-29 DIAGNOSIS — Z7984 Long term (current) use of oral hypoglycemic drugs: Secondary | ICD-10-CM

## 2023-09-29 DIAGNOSIS — I700 Atherosclerosis of aorta: Secondary | ICD-10-CM | POA: Diagnosis not present

## 2023-09-29 DIAGNOSIS — E538 Deficiency of other specified B group vitamins: Secondary | ICD-10-CM | POA: Diagnosis not present

## 2023-09-29 DIAGNOSIS — K219 Gastro-esophageal reflux disease without esophagitis: Secondary | ICD-10-CM | POA: Diagnosis not present

## 2023-09-29 DIAGNOSIS — E118 Type 2 diabetes mellitus with unspecified complications: Secondary | ICD-10-CM

## 2023-09-29 DIAGNOSIS — I10 Essential (primary) hypertension: Secondary | ICD-10-CM

## 2023-09-29 DIAGNOSIS — Z794 Long term (current) use of insulin: Secondary | ICD-10-CM | POA: Diagnosis not present

## 2023-09-29 DIAGNOSIS — C50312 Malignant neoplasm of lower-inner quadrant of left female breast: Secondary | ICD-10-CM

## 2023-09-29 DIAGNOSIS — Z170 Estrogen receptor positive status [ER+]: Secondary | ICD-10-CM

## 2023-09-29 DIAGNOSIS — Z7985 Long-term (current) use of injectable non-insulin antidiabetic drugs: Secondary | ICD-10-CM

## 2023-09-29 DIAGNOSIS — L989 Disorder of the skin and subcutaneous tissue, unspecified: Secondary | ICD-10-CM

## 2023-09-29 DIAGNOSIS — I509 Heart failure, unspecified: Secondary | ICD-10-CM

## 2023-09-29 DIAGNOSIS — Z0000 Encounter for general adult medical examination without abnormal findings: Secondary | ICD-10-CM

## 2023-09-29 DIAGNOSIS — E782 Mixed hyperlipidemia: Secondary | ICD-10-CM | POA: Diagnosis not present

## 2023-09-29 DIAGNOSIS — Z0001 Encounter for general adult medical examination with abnormal findings: Secondary | ICD-10-CM | POA: Diagnosis not present

## 2023-09-29 DIAGNOSIS — R748 Abnormal levels of other serum enzymes: Secondary | ICD-10-CM

## 2023-09-29 DIAGNOSIS — G4700 Insomnia, unspecified: Secondary | ICD-10-CM | POA: Diagnosis not present

## 2023-09-29 DIAGNOSIS — M8580 Other specified disorders of bone density and structure, unspecified site: Secondary | ICD-10-CM

## 2023-09-29 LAB — LIPID PANEL
Cholesterol: 156 mg/dL (ref 0–200)
HDL: 55.5 mg/dL (ref >39.00)
LDL Cholesterol: 89 mg/dL (ref 0–99)
NonHDL: 100.31
Total CHOL/HDL Ratio: 3
Triglycerides: 59 mg/dL (ref 0.0–149.0)
VLDL: 11.8 mg/dL (ref 0.0–40.0)

## 2023-09-29 LAB — CBC WITH DIFFERENTIAL/PLATELET
Basophils Absolute: 0.1 K/uL (ref 0.0–0.1)
Basophils Relative: 1.4 % (ref 0.0–3.0)
Eosinophils Absolute: 0.3 K/uL (ref 0.0–0.7)
Eosinophils Relative: 3.7 % (ref 0.0–5.0)
HCT: 41.5 % (ref 36.0–46.0)
Hemoglobin: 12.8 g/dL (ref 12.0–15.0)
Lymphocytes Relative: 30.6 % (ref 12.0–46.0)
Lymphs Abs: 2.6 K/uL (ref 0.7–4.0)
MCHC: 31 g/dL (ref 30.0–36.0)
MCV: 70.8 fl — ABNORMAL LOW (ref 78.0–100.0)
Monocytes Absolute: 0.9 K/uL (ref 0.1–1.0)
Monocytes Relative: 10.6 % (ref 3.0–12.0)
Neutro Abs: 4.5 K/uL (ref 1.4–7.7)
Neutrophils Relative %: 53.7 % (ref 43.0–77.0)
Platelets: 248 K/uL (ref 150.0–400.0)
RBC: 5.86 Mil/uL — ABNORMAL HIGH (ref 3.87–5.11)
RDW: 16.9 % — ABNORMAL HIGH (ref 11.5–15.5)
WBC: 8.4 K/uL (ref 4.0–10.5)

## 2023-09-29 LAB — COMPREHENSIVE METABOLIC PANEL WITH GFR
ALT: 11 U/L (ref 0–35)
AST: 18 U/L (ref 0–37)
Albumin: 4.4 g/dL (ref 3.5–5.2)
Alkaline Phosphatase: 170 U/L — ABNORMAL HIGH (ref 39–117)
BUN: 17 mg/dL (ref 6–23)
CO2: 31 meq/L (ref 19–32)
Calcium: 10.1 mg/dL (ref 8.4–10.5)
Chloride: 105 meq/L (ref 96–112)
Creatinine, Ser: 0.8 mg/dL (ref 0.40–1.20)
GFR: 71.14 mL/min (ref >60.00)
Glucose, Bld: 77 mg/dL (ref 70–99)
Potassium: 4 meq/L (ref 3.5–5.1)
Sodium: 142 meq/L (ref 135–145)
Total Bilirubin: 0.5 mg/dL (ref 0.2–1.2)
Total Protein: 7.6 g/dL (ref 6.0–8.3)

## 2023-09-29 LAB — MICROALBUMIN / CREATININE URINE RATIO
Creatinine,U: 189.7 mg/dL
Microalb Creat Ratio: 28.9 mg/g (ref 0.0–30.0)
Microalb, Ur: 5.5 mg/dL — ABNORMAL HIGH (ref 0.0–1.9)

## 2023-09-29 LAB — HEMOGLOBIN A1C: Hgb A1c MFr Bld: 8.2 % — ABNORMAL HIGH (ref 4.6–6.5)

## 2023-09-29 LAB — VITAMIN B12: Vitamin B-12: 238 pg/mL (ref 211–911)

## 2023-09-29 MED ORDER — EMPAGLIFLOZIN 25 MG PO TABS: 25.0000 mg | ORAL_TABLET | Freq: Every day | ORAL | 1 refills | Status: DC

## 2023-09-29 NOTE — Assessment & Plan Note (Signed)
 Chronic, stable. Continue trazodone  25-50 mg at bedtime as needed for sleep.

## 2023-09-29 NOTE — Assessment & Plan Note (Signed)
 She has a history of needing B12 injections. Check B12 levels today.

## 2023-09-29 NOTE — Assessment & Plan Note (Signed)
Chronic, stable.  She is euvolemic on exam today.  She is currently following with cardiology.  Continue collaboration recommendations from cardiology. 

## 2023-09-29 NOTE — Patient Instructions (Addendum)
 It was great to see you!  We are checking your labs today and will let you know the results via mychart/phone.   I have placed a referral to dermatology   Keep up the great work!  Let's follow-up in 6 months, sooner if you have concerns.  If a referral was placed today, you will be contacted for an appointment. Please note that routine referrals can sometimes take up to 3-4 weeks to process. Please call our office if you haven't heard anything after this time frame.  Take care,  Tinnie Harada, NP

## 2023-09-29 NOTE — Assessment & Plan Note (Signed)
 She is stable on atorvastatin. She will continue atorvastatin 40mg  daily. Check lipid panel today.

## 2023-09-29 NOTE — Assessment & Plan Note (Addendum)
 Chronic, stable. Continue valsartan  80mg  daily. Check CMP, CBC today.

## 2023-09-29 NOTE — Progress Notes (Signed)
 BP (!) 140/80   Pulse 71   Temp (!) 97.1 F (36.2 C)   Ht 5' 1 (1.549 m)   Wt 143 lb 3.2 oz (65 kg)   LMP 10/17/1991 (Exact Date)   SpO2 100%   BMI 27.06 kg/m    Subjective:    Patient ID: Bonnie Reed, female    DOB: 11-Nov-1945, 78 y.o.   MRN: [id number]  CC: Chief Complaint  Patient presents with   Annual Exam    With fasting labs, concerns with bump on chest for 1 year    HPI: Bonnie Reed is a 78 y.o. female presenting on 09/29/2023 for comprehensive medical examination. Current medical complaints include:bump on chest  Bonnie Reed has a bump on her chest that started about a year ago. It has gotten bigger. It gets in the way of her clothes and jewelry and gets irritated if clothes rub it.   Bonnie Reed currently lives with: son Menopausal Symptoms: no  Depression and Anxiety Screen done today and results listed below:     09/29/2023    8:26 AM 07/30/2023    8:10 AM 04/26/2023   10:16 AM 08/24/2022   10:42 AM 04/20/2022    8:48 AM  Depression screen PHQ 2/9  Decreased Interest 2 0 0 0 0  Down, Depressed, Hopeless 2 0 0 0 0  PHQ - 2 Score 4 0 0 0 0  Altered sleeping 3  2    Tired, decreased energy 3  2    Change in appetite 0  0    Feeling bad or failure about yourself  1  0    Trouble concentrating 0  0    Moving slowly or fidgety/restless 1  0    Suicidal thoughts 0  0    PHQ-9 Score 12  4    Difficult doing work/chores Somewhat difficult  Not difficult at all        09/29/2023    8:27 AM 10/24/2021    1:04 PM  GAD 7 : Generalized Anxiety Score  Nervous, Anxious, on Edge 2 0  Control/stop worrying 1 0  Worry too much - different things 1 0  Trouble relaxing 1 0  Restless 0 0  Easily annoyed or irritable 0 0  Afraid - awful might happen 0 0  Total GAD 7 Score 5 0  Anxiety Difficulty Somewhat difficult     The patient does not have a history of falls. I did not complete a risk assessment for falls. A plan of care for falls was not documented.   Past Medical History:   Past Medical History:  Diagnosis Date   Allergy    Anemia    during knee replacement 15 years ago   Blood transfusion without reported diagnosis    Breast cancer (HCC) 1997   Left Breast Cancer   Cancer (HCC)    Cervical spondylosis    CHF (congestive heart failure) (HCC)    Coronary artery disease involving native coronary artery of native heart without angina pectoris    COVID 04/2021   mild case   Diabetes mellitus without complication (HCC)    Diverticulosis    GERD (gastroesophageal reflux disease)    without esophagitis   History of breast cancer    History of kidney stones    Hyperlipidemia    Hypertension    Lumbar spondylosis    Osteoarthritis    right knee   Osteopenia due to cancer therapy 09/30/2021   Other eczema  Personal history of radiation therapy 1997   Left Breast Cancer   Recurrent major depressive disorder (HCC)    Thyroid  goiter     Surgical History:  Past Surgical History:  Procedure Laterality Date   APPENDECTOMY     BREAST BIOPSY Right 08/01/2021   BREAST LUMPECTOMY Left 1997   CESAREAN SECTION     CHOLECYSTECTOMY     EVACUATION BREAST HEMATOMA Left 07/10/2022   Procedure: DRAIN PLACEMENT LEFT MASTECTOMY CAVITY;  Surgeon: Curvin Deward MOULD, MD;  Location: [facility];  Service: General;  Laterality: Left;   EVACUATION BREAST HEMATOMA Left 06/07/2023   Procedure: EVACUATION LEFT BREAST SEROMA AND DRAIN PLACEMENT;  Surgeon: Curvin Deward MOULD, MD;  Location: [facility];  Service: General;  Laterality: Left;  DRAIN PLACEMENT LEFT CHEST WALL   HEMATOMA EVACUATION Left 03/12/2022   Procedure: PLACEMENT OF DRAIN LEFT CHEST WALL;  Surgeon: Curvin Deward MOULD, MD;  Location: [facility];  Service: General;  Laterality: Left;   KNEE ARTHROSCOPY Right    LESION REMOVAL Left 08/25/2021   Procedure: EXCISION SKIN LESION LEFT CHEST WALL;  Surgeon: Curvin Deward MOULD, MD;  Location: [facility];  Service: General;  Laterality: Left;   REDUCTION MAMMAPLASTY Right 1998    SIMPLE MASTECTOMY WITH AXILLARY SENTINEL NODE BIOPSY Left 08/25/2021   Procedure: LEFT MASTECTOMY;  Surgeon: Curvin Deward MOULD, MD;  Location: [facility];  Service: General;  Laterality: Left;   TOTAL HIP ARTHROPLASTY Right    TUBAL LIGATION      Medications:  Current Outpatient Medications on File Prior to Visit  Medication Sig   albuterol  (VENTOLIN  HFA) 108 (90 Base) MCG/ACT inhaler INHALE 1 TO 2 PUFFS BY MOUTH EVERY 6 HOURS AS NEEDED FOR WHEEZING FOR SHORTNESS OF BREATH   anastrozole  (ARIMIDEX ) 1 MG tablet Take 1 tablet (1 mg total) by mouth daily.   aspirin EC 81 MG tablet Take 81 mg by mouth in the morning.   Blood Glucose Monitoring Suppl (GLUCOCOM BLOOD GLUCOSE MONITOR) DEVI 1 each by Other route daily.   Carboxymethylcell-Hypromellose (GENTEAL OP) Place 1 drop into both eyes daily as needed (dry eyes).   cetirizine (ZYRTEC) 10 MG tablet Take 10 mg by mouth daily as needed for allergies.   clobetasol ointment (TEMOVATE) 0.05 % Apply 1 Application topically as needed (rash).   Coenzyme Q10 (COQ10) 100 MG CAPS Take 100 mg by mouth 3 (three) times a week.   denosumab  (PROLIA ) 60 MG/ML SOSY injection Inject 60 mg into the skin every 6 (six) months.   empagliflozin  (JARDIANCE ) 10 MG TABS tablet Take 1 tablet by mouth once daily   esomeprazole  (NEXIUM ) 40 MG capsule Take 1 capsule by mouth once daily   fluticasone  (FLONASE ) 50 MCG/ACT nasal spray Place 2 sprays into both nostrils daily as needed for allergies.   folic acid  (FOLVITE ) 1 MG tablet Take 1 tablet (1 mg total) by mouth daily.   gabapentin  (NEURONTIN ) 300 MG capsule Take 300 mg by mouth at bedtime.   glucose blood (ONETOUCH VERIO) test strip USE AS DIRECTED THREE TIMES DAILY   ibuprofen  (ADVIL ) 200 MG tablet Take 400 mg by mouth every 8 (eight) hours as needed (pain.).   Insulin  Pen Needle (BD PEN NEEDLE NANO 2ND GEN) 32G X 4 MM MISC USE AS DIRECTED THREE TIMES DAILY   LANTUS  SOLOSTAR 100 UNIT/ML Solostar Pen INJECT 20 UNITS  SUBCUTANEOUSLY TWICE DAILY   LINZESS  145 MCG CAPS capsule TAKE 1 CAPSULE BY MOUTH ONCE DAILY AS NEEDED FOR CONSTIPATION  nitroGLYCERIN  (NITROSTAT ) 0.4 MG SL tablet Place 1 tablet (0.4 mg total) under the tongue every 5 (five) minutes as needed for chest pain.   rosuvastatin  (CRESTOR ) 20 MG tablet Take 1 tablet (20 mg total) by mouth daily. Take on Monday, Wednesday and Friday along with CoQ10   Semaglutide , 2 MG/DOSE, (OZEMPIC , 2 MG/DOSE,) 8 MG/3ML SOPN INJECT 2MG  SUBCUTANEOUSLY ONCE WEEKLY   traMADol  (ULTRAM ) 50 MG tablet Take 1 tablet (50 mg total) by mouth every 6 (six) hours as needed. (Patient taking differently: Take 50 mg by mouth every 6 (six) hours as needed for moderate pain (pain score 4-6) or severe pain (pain score 7-10).)   traZODone  (DESYREL ) 50 MG tablet TAKE 1/2 TO 1 (ONE-HALF TO ONE) TABLET BY MOUTH AT BEDTIME AS NEEDED FOR SLEEP (Patient taking differently: Take 25-50 mg by mouth at bedtime as needed for sleep.)   valsartan  (DIOVAN ) 80 MG tablet Take 1 tablet by mouth once daily   oxyCODONE  (ROXICODONE ) 5 MG immediate release tablet Take 1 tablet (5 mg total) by mouth every 6 (six) hours as needed for severe pain (pain score 7-10). (Patient not taking: Reported on 09/29/2023)   No current facility-administered medications on file prior to visit.    Allergies:  No Known Allergies  Social History:  Social History   Socioeconomic History   Marital status: Divorced    Spouse name: Not on file   Number of children: 4   Years of education: Not on file   Highest education level: Not on file  Occupational History   Occupation: Retired  Tobacco Use   Smoking status: Never   Smokeless tobacco: Never  Vaping Use   Vaping status: Never Used  Substance and Sexual Activity   Alcohol use: No   Drug use: No   Sexual activity: Not Currently  Other Topics Concern   Not on file  Social History Narrative   Not on file   Social Drivers of Health   Financial Resource Strain:  Low Risk  (04/26/2023)   Overall Financial Resource Strain (CARDIA)    Difficulty of Paying Living Expenses: Not hard at all  Food Insecurity: Food Insecurity Present (04/28/2023)   Hunger Vital Sign    Worried About Running Out of Food in the Last Year: Sometimes true    Ran Out of Food in the Last Year: Sometimes true  Transportation Needs: No Transportation Needs (04/28/2023)   PRAPARE - Administrator, Civil Service (Medical): No    Lack of Transportation (Non-Medical): No  Physical Activity: Sufficiently Active (04/26/2023)   Exercise Vital Sign    Days of Exercise per Week: 7 days    Minutes of Exercise per Session: 30 min  Stress: No Stress Concern Present (04/26/2023)   [employer] of Occupational Health - Occupational Stress Questionnaire    Feeling of Stress : Not at all  Social Connections: Moderately Isolated (04/26/2023)   Social Connection and Isolation Panel    Frequency of Communication with Friends and Family: More than three times a week    Frequency of Social Gatherings with Friends and Family: More than three times a week    Attends Religious Services: More than 4 times per year    Active Member of [employer] or Organizations: No    Attends Banker Meetings: Never    Marital Status: Divorced  Catering manager Violence: Not At Risk (04/28/2023)   Humiliation, Afraid, Rape, and Kick questionnaire    Fear of Current or Ex-Partner: No  Emotionally Abused: No    Physically Abused: No    Sexually Abused: No   Social History   Tobacco Use  Smoking Status Never  Smokeless Tobacco Never   Social History   Substance and Sexual Activity  Alcohol Use No    Family History:  Family History  Problem Relation Age of Onset   Stroke Mother    Kidney disease Mother    Dementia Mother    Cancer Father        lung   Colon cancer Neg Hx    Rectal cancer Neg Hx     Past medical history, surgical history, medications, allergies, family history and  social history reviewed with patient today and changes made to appropriate areas of the chart.   Review of Systems  Constitutional:  Positive for malaise/fatigue. Negative for fever.  HENT: Negative.    Eyes: Negative.   Respiratory: Negative.    Cardiovascular: Negative.   Gastrointestinal: Negative.   Genitourinary: Negative.   Musculoskeletal:  Positive for joint pain.  Skin:        Bump on skin on chest and back of neck  Neurological:  Positive for dizziness (at times).  Psychiatric/Behavioral: Negative.     All other ROS negative except what is listed above and in the HPI.      Objective:    BP (!) 140/80   Pulse 71   Temp (!) 97.1 F (36.2 C)   Ht 5' 1 (1.549 m)   Wt 143 lb 3.2 oz (65 kg)   LMP 10/17/1991 (Exact Date)   SpO2 100%   BMI 27.06 kg/m   Wt Readings from Last 3 Encounters:  09/29/23 143 lb 3.2 oz (65 kg)  07/30/23 149 lb (67.6 kg)  06/07/23 149 lb 4 oz (67.7 kg)    Physical Exam Vitals and nursing note reviewed.  Constitutional:      General: Bonnie Reed is not in acute distress.    Appearance: Normal appearance.  HENT:     Head: Normocephalic and atraumatic.     Right Ear: Tympanic membrane, ear canal and external ear normal.     Left Ear: Tympanic membrane, ear canal and external ear normal.     Mouth/Throat:     Mouth: Mucous membranes are moist.     Pharynx: No posterior oropharyngeal erythema.  Eyes:     Conjunctiva/sclera: Conjunctivae normal.  Cardiovascular:     Rate and Rhythm: Normal rate and regular rhythm.     Pulses: Normal pulses.     Heart sounds: Normal heart sounds.  Pulmonary:     Effort: Pulmonary effort is normal.     Breath sounds: Normal breath sounds.  Abdominal:     Palpations: Abdomen is soft.     Tenderness: There is no abdominal tenderness.  Musculoskeletal:        General: Normal range of motion.     Cervical back: Normal range of motion and neck supple.     Right lower leg: No edema.     Left lower leg: No edema.   Lymphadenopathy:     Cervical: No cervical adenopathy.  Skin:    General: Skin is warm and dry.     Comments: Raised skin lesion to right chest and left posterior neck, no drainage, signs of infection  Neurological:     General: No focal deficit present.     Mental Status: Bonnie Reed is alert and oriented to person, place, and time.     Cranial Nerves: No cranial  nerve deficit.     Coordination: Coordination normal.     Gait: Gait normal.  Psychiatric:        Mood and Affect: Mood normal.        Behavior: Behavior normal.        Thought Content: Thought content normal.        Judgment: Judgment normal.     Results for orders placed or performed in visit on 09/29/23  CBC with Differential/Platelet   Collection Time: 09/29/23  8:49 AM  Result Value Ref Range   WBC 8.4 4.0 - 10.5 K/uL   RBC 5.86 (H) 3.87 - 5.11 Mil/uL   Hemoglobin 12.8 12.0 - 15.0 g/dL   HCT 58.4 63.9 - 53.9 %   MCV 70.8 (L) 78.0 - 100.0 fl   MCHC 31.0 30.0 - 36.0 g/dL   RDW 83.0 (H) 88.4 - 84.4 %   Platelets 248.0 150.0 - 400.0 K/uL   Neutrophils Relative % 53.7 43.0 - 77.0 %   Lymphocytes Relative 30.6 12.0 - 46.0 %   Monocytes Relative 10.6 3.0 - 12.0 %   Eosinophils Relative 3.7 0.0 - 5.0 %   Basophils Relative 1.4 0.0 - 3.0 %   Neutro Abs 4.5 1.4 - 7.7 K/uL   Lymphs Abs 2.6 0.7 - 4.0 K/uL   Monocytes Absolute 0.9 0.1 - 1.0 K/uL   Eosinophils Absolute 0.3 0.0 - 0.7 K/uL   Basophils Absolute 0.1 0.0 - 0.1 K/uL  Comprehensive metabolic panel with GFR   Collection Time: 09/29/23  8:49 AM  Result Value Ref Range   Sodium 142 135 - 145 mEq/L   Potassium 4.0 3.5 - 5.1 mEq/L   Chloride 105 96 - 112 mEq/L   CO2 31 19 - 32 mEq/L   Glucose, Bld 77 70 - 99 mg/dL   BUN 17 6 - 23 mg/dL   Creatinine, Ser 9.19 0.40 - 1.20 mg/dL   Total Bilirubin 0.5 0.2 - 1.2 mg/dL   Alkaline Phosphatase 170 (H) 39 - 117 U/L   AST 18 0 - 37 U/L   ALT 11 0 - 35 U/L   Total Protein 7.6 6.0 - 8.3 g/dL   Albumin 4.4 3.5 - 5.2 g/dL    GFR 28.85 >39.99 mL/min   Calcium  10.1 8.4 - 10.5 mg/dL  Lipid panel   Collection Time: 09/29/23  8:49 AM  Result Value Ref Range   Cholesterol 156 0 - 200 mg/dL   Triglycerides 40.9 0.0 - 149.0 mg/dL   HDL 44.49 >60.99 mg/dL   VLDL 88.1 0.0 - 59.9 mg/dL   LDL Cholesterol 89 0 - 99 mg/dL   Total CHOL/HDL Ratio 3    NonHDL 100.31   Hemoglobin A1c   Collection Time: 09/29/23  8:49 AM  Result Value Ref Range   Hgb A1c MFr Bld 8.2 (H) 4.6 - 6.5 %  Microalbumin / creatinine urine ratio   Collection Time: 09/29/23  8:49 AM  Result Value Ref Range   Microalb, Ur 5.5 (H) 0.0 - 1.9 mg/dL   Creatinine,U 810.2 mg/dL   Microalb Creat Ratio 28.9 0.0 - 30.0 mg/g  Vitamin B12   Collection Time: 09/29/23  8:49 AM  Result Value Ref Range   Vitamin B-12 238 211 - 911 pg/mL      Assessment & Plan:   Problem List Items Addressed This Visit       Cardiovascular and Mediastinum   Hypertension   Chronic, stable. Continue valsartan  80mg  daily. Check CMP, CBC today.  Relevant Orders   CBC with Differential/Platelet (Completed)   Comprehensive metabolic panel with GFR (Completed)   CHF (congestive heart failure) (HCC)   Chronic, stable.  Bonnie Reed is euvolemic on exam today.  Bonnie Reed is currently following with cardiology.  Continue collaboration recommendations from cardiology.      Aortic atherosclerosis (HCC)   Noted on PET scan 12/14/22. Continue atorvastatin  40mg  daily and aspirin 81mg  daily.         Digestive   Gastroesophageal reflux disease without esophagitis   Chronic, stable. Continue nexium  40mg  daily.         Endocrine   Type 2 diabetes mellitus with complication, with long-term current use of insulin  (HCC)   Chronic, stable. Her diabetes is well-controlled with Ozempic  2mg  injection weekly, jardiance  10mg  daily and as-needed insulin . Check A1c, CMP, CBC, and urine microalbumin today. Follow-up in 6 months.       Relevant Orders   CBC with Differential/Platelet (Completed)    Comprehensive metabolic panel with GFR (Completed)   Hemoglobin A1c (Completed)   Microalbumin / creatinine urine ratio (Completed)     Musculoskeletal and Integument   Osteopenia due to cancer therapy (Chronic)   T score -1.8 on 09/30/21. Oncology is currently prescribing prolia  injections every 6 months. Discussed that I recommend repeat DEXA scan and Bonnie Reed will discuss with her oncologist.         Other   Malignant neoplasm of lower-inner quadrant of left breast in female, estrogen receptor positive (HCC)   S/P mastectomy. Bonnie Reed is following regularly with oncology and surgery. Bonnie Reed is taking anastrozole  1mg  daily. Continue collaboration and recommendations from oncology.       Mixed hyperlipidemia   Bonnie Reed is stable on atorvastatin . Bonnie Reed will continue atorvastatin  40mg  daily. Check lipid panel today.       Relevant Orders   CBC with Differential/Platelet (Completed)   Comprehensive metabolic panel with GFR (Completed)   Lipid panel (Completed)   Insomnia   Chronic, stable. Continue trazodone  25-50 mg at bedtime as needed for sleep.      B12 deficiency   Bonnie Reed has a history of needing B12 injections. Check B12 levels today.       Relevant Orders   Vitamin B12 (Completed)   Long-term current use of injectable noninsulin antidiabetic medication   Continue ozempic  2mg  injection weekly.       Long term current use of oral hypoglycemic drug   Continue jardiance  10mg  daily       Other Visit Diagnoses       Routine general medical examination at a health care facility    -  Primary   Health maintenance reviewed and updated. Discussed nutrition, exercise.     Skin lesion       Raised skin lesion to right chest and posterior neck. Referral placed to dermatology.   Relevant Orders   Ambulatory referral to Dermatology        Follow up plan: Return in about 6 months (around 03/31/2024) for Diabetes.   LABORATORY TESTING:  - Pap smear: not applicable  IMMUNIZATIONS:   - Tdap:  Tetanus vaccination status reviewed: last tetanus booster within 10 years. - Influenza: Postponed to flu season - Pneumovax: Up to date - Prevnar: Up to date - HPV: Not applicable - Shingrix vaccine: Up to date  SCREENING: -Mammogram: Up to date  - Colonoscopy: Not applicable  - Bone Density: oncology dosing prolia  - discussed Bonnie Reed is due for dexa   PATIENT COUNSELING:   Advised to take  1 mg of folate supplement per day if capable of pregnancy.   Sexuality: Discussed sexually transmitted diseases, partner selection, use of condoms, avoidance of unintended pregnancy  and contraceptive alternatives.   Advised to avoid cigarette smoking.  I discussed with the patient that most people either abstain from alcohol or drink within safe limits (<=14/week and <=4 drinks/occasion for males, <=7/weeks and <= 3 drinks/occasion for females) and that the risk for alcohol disorders and other health effects rises proportionally with the number of drinks per week and how often a drinker exceeds daily limits.  Discussed cessation/primary prevention of drug use and availability of treatment for abuse.   Diet: Encouraged to adjust caloric intake to maintain  or achieve ideal body weight, to reduce intake of dietary saturated fat and total fat, to limit sodium intake by avoiding high sodium foods and not adding table salt, and to maintain adequate dietary potassium and calcium  preferably from fresh fruits, vegetables, and low-fat dairy products.    stressed the importance of regular exercise  Injury prevention: Discussed safety belts, safety helmets, smoke detector, smoking near bedding or upholstery.   Dental health: Discussed importance of regular tooth brushing, flossing, and dental visits.    NEXT PREVENTATIVE PHYSICAL DUE IN 1 YEAR. Return in about 6 months (around 03/31/2024) for Diabetes.  Auron Tadros A Shelba Susi

## 2023-09-29 NOTE — Assessment & Plan Note (Signed)
 Noted on PET scan 12/14/22. Continue atorvastatin 40mg  daily and aspirin 81mg  daily.

## 2023-09-29 NOTE — Assessment & Plan Note (Signed)
 Continue ozempic 2mg  injection weekly.

## 2023-09-29 NOTE — Assessment & Plan Note (Signed)
Chronic, stable. Continue nexium 40mg  daily.

## 2023-09-29 NOTE — Assessment & Plan Note (Signed)
 S/P mastectomy. She is following regularly with oncology and surgery. She is taking anastrozole  1mg  daily. Continue collaboration and recommendations from oncology.

## 2023-09-29 NOTE — Assessment & Plan Note (Signed)
 T score -1.8 on 09/30/21. Oncology is currently prescribing prolia  injections every 6 months. Discussed that I recommend repeat DEXA scan and she will discuss with her oncologist.

## 2023-09-29 NOTE — Assessment & Plan Note (Signed)
-   Continue jardiance 10 mg daily

## 2023-09-29 NOTE — Assessment & Plan Note (Signed)
 Chronic, stable. Her diabetes is well-controlled with Ozempic  2mg  injection weekly, jardiance  10mg  daily and as-needed insulin . Check A1c, CMP, CBC, and urine microalbumin today. Follow-up in 6 months.

## 2023-10-07 ENCOUNTER — Other Ambulatory Visit: Payer: Self-pay | Admitting: Nurse Practitioner

## 2023-10-07 NOTE — Telephone Encounter (Signed)
 Requesting: Albuterol  Sulfate HFA 108 (90 Base) MCG/ACT Inhalation Aerosol Solution  Last Visit: 09/29/2023 Next Visit: Visit date not found Last Refill: 09/14/2023  Please Advise

## 2023-10-19 ENCOUNTER — Other Ambulatory Visit: Payer: Self-pay | Admitting: Nurse Practitioner

## 2023-10-19 NOTE — Telephone Encounter (Signed)
 Requesting: Ozempic  (2 MG/DOSE) 8 MG/3ML Subcutaneous Solution Pen-injector  Last Visit: 09/29/2023 Next Visit: Visit date not found Last Refill: 07/12/2023  Please Advise

## 2023-10-20 DIAGNOSIS — Z96641 Presence of right artificial hip joint: Secondary | ICD-10-CM | POA: Diagnosis not present

## 2023-10-20 DIAGNOSIS — M1711 Unilateral primary osteoarthritis, right knee: Secondary | ICD-10-CM | POA: Diagnosis not present

## 2023-10-20 DIAGNOSIS — M25551 Pain in right hip: Secondary | ICD-10-CM | POA: Diagnosis not present

## 2023-10-28 ENCOUNTER — Other Ambulatory Visit: Payer: Self-pay | Admitting: Nurse Practitioner

## 2023-10-28 NOTE — Telephone Encounter (Signed)
 Requesting: Albuterol  Sulfate HFA 108 (90 Base) MCG/ACT Inhalation Aerosol Solution  Last Visit: 09/29/2023 Next Visit: Visit date not found Last Refill: 10/07/2023  Please Advise

## 2023-11-01 NOTE — Progress Notes (Signed)
 Cardiology Office Note:    Date:  11/02/2023   ID:  Bonnie Reed, DOB 1945-06-23, MRN [id number]  PCP:  Nedra Tinnie LABOR, NP   [facility] Providers Cardiologist:  Lamar Fitch, MD     Referring MD: Nedra Tinnie LABOR, NP     History of Present Illness:    Bonnie Reed is a 78 y.o. female with a hx of hypertension, HFpEF, CAD s/p LHC in 2012 revealing moderate stenosis of LAD, GERD, DM 2, anemia, depression, hyperlipidemia, breast cancer s/p mastectomy, LAFB, incomplete RBBB.  01/14/2023 Lexiscan  low risk, no ischemia, no infarction 07/24/2021 Lexiscan  low risk 07/14/2021 echo EF 60 to 65%, moderate concentric LVH, grade 1 DD, trivial MR with moderate mitral annular calcification, mild calcification of aortic valve with sclerosis present 02/04/2011 left heart cath at [facility] moderate disease in the LAD where the vessel begins, not suitable for intervention  Establish care with Dr. Fitch in May 2023 at the behest of her oncologist Dr. Timmy for preoperative evaluation for upcoming mastectomy related to breast cancer. Evaluated by Dr. Fitch on 01/06/2023, she continued to have episodes of atypical chest pain.  She had undergone MRI of her spine was told this was the reason for her chest pain.  Decision was made to proceed with a stress evaluation which was low risk. Evaluated most recently by myself on 01/06/2023, was stable but we changed her atorvastatin  to rosuvastatin  and made plans to follow up in 6 months.   She presents for follow-up.  She mention she has had some episodes of chest pain that have been concerning with her, consistent with angina, occurring with exertion, and relieved with rest.  She has not needed to use her nitroglycerin .  She does continue to be active, walking her dogs. She denies palpitations, dyspnea, pnd, orthopnea, n, v, dizziness, syncope, edema, weight gain, or early satiety.    EKG Interpretation Date/Time:  Tuesday November 02 2023  10:55:09 EDT Ventricular Rate:  85 PR Interval:  180 QRS Duration:  102 QT Interval:  340 QTC Calculation: 404 R Axis:   -54  Text Interpretation: Normal sinus rhythm Incomplete right bundle branch block Left anterior fascicular block Abnormal ECG When compared with ECG of 07-Mar-2023 08:13, PREVIOUS ECG IS PRESENT Confirmed by Carlin Nest [phone]) on 11/02/2023 10:59:04 AM    Past Medical History:  Diagnosis Date   Abnormal nuclear stress test 03/31/2016   Formatting of this note might be different from the original.  mild ischemia, mid and distal LAD  Formatting of this note might be different from the original.  Overview:   mild ischemia, mid and distal LAD     Acute diverticulitis 01/12/2023   Acute non-recurrent pansinusitis 06/11/2017   Allergy    Allergy to dog dander 11/22/2014   Formatting of this note might be different from the original.  Overview:   RAST screen -  Class III to dog dander.  Formatting of this note might be different from the original.  RAST screen -  Class III to dog dander.     Allergy to pollen 11/22/2014   Formatting of this note might be different from the original.  Overview:   RAST screen - Class V to grass; Class IV to tree;  Formatting of this note might be different from the original.  RAST screen - Class V to grass; Class IV to tree;     Allergy to wheat 11/22/2014   Formatting of this note might be different  from the original.  Overview:   RAST screen -  Class I to wheat  Formatting of this note might be different from the original.  RAST screen -  Class I to wheat     Anemia    during knee replacement 15 years ago   Aortic atherosclerosis (HCC) 04/01/2023   B12 deficiency 03/03/2022   Blood transfusion without reported diagnosis    Breast cancer (HCC) 1997   Left Breast Cancer   CAD (coronary artery disease) 01/28/2011   Calculus of kidney 03/31/2016   Cancer (HCC)    Cervical spondylosis    Cervical spondylosis without myelopathy 06/19/2011    CHF (congestive heart failure) (HCC)    Coronary artery disease involving native coronary artery of native heart without angina pectoris    COVID 04/2021   mild case   Depression 07/22/2021   Diabetes mellitus without complication (HCC)    Diverticulosis    Eczema 04/08/2018   Gastroesophageal reflux disease without esophagitis 03/31/2016   GERD (gastroesophageal reflux disease)    without esophagitis   Healthcare maintenance 04/21/2017   History of breast cancer    History of kidney stones    Hx of cardiac cath 03/31/2016   Formatting of this note might be different from the original.  11/12:DRH:Mild moderate mid distal LAD small vessel, on the small end for a stent.  Formatting of this note might be different from the original.  Overview:   11/12:DRH:Mild moderate mid distal LAD small vessel, on the small end for a stent.     Hyperlipidemia    Hypertension    Hypertrophy of nasal turbinates 06/16/2016   Formatting of this note might be different from the original.  s/p RFTA turbinates  07/14/2016  - Dr. Larinda     Hypochromic microcytic anemia 04/08/2018   Insomnia 12/03/2021   Long term current use of oral hypoglycemic drug 04/01/2023   Long-term current use of injectable noninsulin antidiabetic medication 04/01/2023   Low back pain 06/19/2011   Lumbar spondylosis    Lumbosacral spondylosis without myelopathy 06/19/2011   Malignant neoplasm of lower-inner quadrant of left breast in female, estrogen receptor positive (HCC) 07/07/2021   Mixed hyperlipidemia 04/08/2018   Neck pain 06/19/2011   Nontoxic goiter, unspecified 03/31/2016   Osteopenia due to cancer therapy 09/30/2021   Personal history of radiation therapy 1997   Left Breast Cancer   Primary osteoarthritis of right knee 02/01/2018   Recurrent major depressive disorder (HCC)    Rhinitis, chronic 03/22/2017   Right leg pain 02/08/2019   Thyroid  goiter    Type 2 diabetes mellitus with complication, with long-term  current use of insulin  (HCC) 10/16/2016    Past Surgical History:  Procedure Laterality Date   APPENDECTOMY     BREAST BIOPSY Right 08/01/2021   BREAST LUMPECTOMY Left 1997   CESAREAN SECTION     CHOLECYSTECTOMY     EVACUATION BREAST HEMATOMA Left 07/10/2022   Procedure: DRAIN PLACEMENT LEFT MASTECTOMY CAVITY;  Surgeon: Curvin Deward MOULD, MD;  Location: [facility];  Service: General;  Laterality: Left;   EVACUATION BREAST HEMATOMA Left 06/07/2023   Procedure: EVACUATION LEFT BREAST SEROMA AND DRAIN PLACEMENT;  Surgeon: Curvin Deward MOULD, MD;  Location: [facility];  Service: General;  Laterality: Left;  DRAIN PLACEMENT LEFT CHEST WALL   HEMATOMA EVACUATION Left 03/12/2022   Procedure: PLACEMENT OF DRAIN LEFT CHEST WALL;  Surgeon: Curvin Deward MOULD, MD;  Location: [facility];  Service: General;  Laterality: Left;  KNEE ARTHROSCOPY Right    LESION REMOVAL Left 08/25/2021   Procedure: EXCISION SKIN LESION LEFT CHEST WALL;  Surgeon: Curvin Deward MOULD, MD;  Location: [facility];  Service: General;  Laterality: Left;   REDUCTION MAMMAPLASTY Right 1998   SIMPLE MASTECTOMY WITH AXILLARY SENTINEL NODE BIOPSY Left 08/25/2021   Procedure: LEFT MASTECTOMY;  Surgeon: Curvin Deward MOULD, MD;  Location: [facility];  Service: General;  Laterality: Left;   TOTAL HIP ARTHROPLASTY Right    TUBAL LIGATION      Current Medications: Current Meds  Medication Sig   albuterol  (VENTOLIN  HFA) 108 (90 Base) MCG/ACT inhaler INHALE 1 TO 2 PUFFS BY MOUTH EVERY 6 HOURS AS NEEDED FOR WHEEZING FOR SHORTNESS OF BREATH   anastrozole  (ARIMIDEX ) 1 MG tablet Take 1 tablet (1 mg total) by mouth daily.   aspirin EC 81 MG tablet Take 81 mg by mouth in the morning.   Carboxymethylcell-Hypromellose (GENTEAL OP) Place 1 drop into both eyes daily as needed (dry eyes).   cetirizine (ZYRTEC) 10 MG tablet Take 10 mg by mouth daily as needed for allergies.   clobetasol ointment (TEMOVATE) 0.05 % Apply 1 Application topically as needed (rash).    Coenzyme Q10 (COQ10) 100 MG CAPS Take 100 mg by mouth 3 (three) times a week.   denosumab  (PROLIA ) 60 MG/ML SOSY injection Inject 60 mg into the skin every 6 (six) months.   empagliflozin  (JARDIANCE ) 25 MG TABS tablet Take 1 tablet (25 mg total) by mouth daily before breakfast.   esomeprazole  (NEXIUM ) 40 MG capsule Take 1 capsule by mouth once daily   fluticasone  (FLONASE ) 50 MCG/ACT nasal spray Place 2 sprays into both nostrils daily as needed for allergies.   folic acid  (FOLVITE ) 1 MG tablet Take 1 tablet (1 mg total) by mouth daily.   gabapentin  (NEURONTIN ) 300 MG capsule Take 300 mg by mouth as needed (pain).   glucose blood (ONETOUCH VERIO) test strip USE AS DIRECTED THREE TIMES DAILY   ibuprofen  (ADVIL ) 200 MG tablet Take 400 mg by mouth every 8 (eight) hours as needed (pain.).   Insulin  Pen Needle (BD PEN NEEDLE NANO 2ND GEN) 32G X 4 MM MISC USE AS DIRECTED THREE TIMES DAILY   LANTUS  SOLOSTAR 100 UNIT/ML Solostar Pen INJECT 20 UNITS SUBCUTANEOUSLY TWICE DAILY   LINZESS  145 MCG CAPS capsule TAKE 1 CAPSULE BY MOUTH ONCE DAILY AS NEEDED FOR CONSTIPATION   metoprolol  tartrate (LOPRESSOR ) 100 MG tablet Take one tablet 2 hours before cardiac CT for heart greater than 55   nitroGLYCERIN  (NITROSTAT ) 0.4 MG SL tablet Place 1 tablet (0.4 mg total) under the tongue every 5 (five) minutes as needed for chest pain.   rosuvastatin  (CRESTOR ) 20 MG tablet Take 1 tablet (20 mg total) by mouth daily. Take on Monday, Wednesday and Friday along with CoQ10   Semaglutide , 2 MG/DOSE, (OZEMPIC , 2 MG/DOSE,) 8 MG/3ML SOPN INJECT 2MG  SUBCUTANEOUSLY ONCE WEEKLY   traMADol  (ULTRAM ) 50 MG tablet Take 1 tablet (50 mg total) by mouth every 6 (six) hours as needed.   traZODone  (DESYREL ) 50 MG tablet TAKE 1/2 TO 1 (ONE-HALF TO ONE) TABLET BY MOUTH AT BEDTIME AS NEEDED FOR SLEEP   valsartan  (DIOVAN ) 80 MG tablet Take 1 tablet by mouth once daily     Allergies:   Patient has no known allergies.   Social History    Socioeconomic History   Marital status: Divorced    Spouse name: Not on file   Number of children: 4   Years of  education: Not on file   Highest education level: Not on file  Occupational History   Occupation: Retired  Tobacco Use   Smoking status: Never   Smokeless tobacco: Never  Vaping Use   Vaping status: Never Used  Substance and Sexual Activity   Alcohol use: No   Drug use: No   Sexual activity: Not Currently  Other Topics Concern   Not on file  Social History Narrative   Not on file   Social Drivers of Health   Financial Resource Strain: Low Risk  (04/26/2023)   Overall Financial Resource Strain (CARDIA)    Difficulty of Paying Living Expenses: Not hard at all  Food Insecurity: Food Insecurity Present (04/28/2023)   Hunger Vital Sign    Worried About Running Out of Food in the Last Year: Sometimes true    Ran Out of Food in the Last Year: Sometimes true  Transportation Needs: No Transportation Needs (04/28/2023)   PRAPARE - Administrator, Civil Service (Medical): No    Lack of Transportation (Non-Medical): No  Physical Activity: Sufficiently Active (04/26/2023)   Exercise Vital Sign    Days of Exercise per Week: 7 days    Minutes of Exercise per Session: 30 min  Stress: No Stress Concern Present (04/26/2023)   [employer] of Occupational Health - Occupational Stress Questionnaire    Feeling of Stress : Not at all  Social Connections: Moderately Isolated (04/26/2023)   Social Connection and Isolation Panel    Frequency of Communication with Friends and Family: More than three times a week    Frequency of Social Gatherings with Friends and Family: More than three times a week    Attends Religious Services: More than 4 times per year    Active Member of [employer] or Organizations: No    Attends Banker Meetings: Never    Marital Status: Divorced     Family History: The patient's family history includes Cancer in her father; Dementia in her  mother; Kidney disease in her mother; Stroke in her mother. There is no history of Colon cancer or Rectal cancer.  ROS:   Please see the history of present illness.    All other systems reviewed and are negative.  EKGs/Labs/Other Studies Reviewed:    The following studies were reviewed today: Cardiac Studies & Procedures   ______________________________________________________________________________________________   STRESS TESTS  MYOCARDIAL PERFUSION IMAGING 01/12/2023  Interpretation Summary   Findings are consistent with no ischemia and no infarction. The study is low risk.   No ST deviation was noted.   Left ventricular function is normal. Nuclear stress EF: 67%. The left ventricular ejection fraction is hyperdynamic (>65%). End diastolic cavity size is normal.   Prior study available for comparison from 07/24/2021.   ECHOCARDIOGRAM  ECHOCARDIOGRAM COMPLETE 07/14/2021  Narrative ECHOCARDIOGRAM REPORT    Patient Name:   Bonnie Reed Date of Exam: 07/14/2021 Medical Rec #:  [id number]          Height:       61.0 in Accession #:    [id number]         Weight:       176.0 lb Date of Birth:  09/28/1945          BSA:          1.789 m Patient Age:    76 years           BP:           146/63 mmHg  Patient Gender: F                  HR:           77 bpm. Exam Location:  Outpatient  Procedure: 2D Echo, Cardiac Doppler and Color Doppler  Indications:    CHF  History:        Patient has no prior history of Echocardiogram examinations. CHF; Risk Factors:Hypertension and Diabetes.  Sonographer:    Waddell Captain Referring Phys: [id number] PETER R ENNEVER   Sonographer Comments: Image acquisition challenging due to respiratory motion. Global longitudinal strain was attempted. IMPRESSIONS   1. Left ventricular ejection fraction, by estimation, is 60 to 65%. The left ventricle has normal function. The left ventricle has no regional wall motion abnormalities. There is moderate  concentric left ventricular hypertrophy. Left ventricular diastolic parameters are consistent with Grade I diastolic dysfunction (impaired relaxation). 2. Right ventricular systolic function is normal. The right ventricular size is normal. Tricuspid regurgitation signal is inadequate for assessing PA pressure. 3. The mitral valve is abnormal. Trivial mitral valve regurgitation. Moderate mitral annular calcification. 4. The aortic valve is tricuspid. There is mild calcification of the aortic valve. There is mild thickening of the aortic valve. Aortic valve regurgitation is mild. Aortic valve sclerosis/calcification is present, without any evidence of aortic stenosis. 5. The inferior vena cava is normal in size with greater than 50% respiratory variability, suggesting right atrial pressure of 3 mmHg.  FINDINGS Left Ventricle: Left ventricular ejection fraction, by estimation, is 60 to 65%. The left ventricle has normal function. The left ventricle has no regional wall motion abnormalities. Global longitudinal strain performed but not reported based on interpreter judgement due to suboptimal tracking. The left ventricular internal cavity size was normal in size. There is moderate concentric left ventricular hypertrophy. Left ventricular diastolic parameters are consistent with Grade I diastolic dysfunction (impaired relaxation).  Right Ventricle: The right ventricular size is normal. No increase in right ventricular wall thickness. Right ventricular systolic function is normal. Tricuspid regurgitation signal is inadequate for assessing PA pressure.  Left Atrium: Left atrial size was normal in size.  Right Atrium: Right atrial size was normal in size.  Pericardium: There is no evidence of pericardial effusion.  Mitral Valve: The mitral valve is abnormal. There is mild thickening of the mitral valve leaflet(s). There is mild calcification of the mitral valve leaflet(s). Moderate mitral annular  calcification. Trivial mitral valve regurgitation.  Tricuspid Valve: The tricuspid valve is normal in structure. Tricuspid valve regurgitation is trivial.  Aortic Valve: The aortic valve is tricuspid. There is mild calcification of the aortic valve. There is mild thickening of the aortic valve. Aortic valve regurgitation is mild. Aortic valve sclerosis/calcification is present, without any evidence of aortic stenosis. Aortic valve peak gradient measures 8.9 mmHg.  Pulmonic Valve: The pulmonic valve was normal in structure. Pulmonic valve regurgitation is trivial.  Aorta: The aortic root and ascending aorta are structurally normal, with no evidence of dilitation.  Venous: The inferior vena cava is normal in size with greater than 50% respiratory variability, suggesting right atrial pressure of 3 mmHg.  IAS/Shunts: There is redundancy of the interatrial septum. The atrial septum is grossly normal.   LEFT VENTRICLE PLAX 2D LVIDd:         4.00 cm     Diastology LVIDs:         2.60 cm     LV e' medial:    4.03 cm/s LV PW:  1.40 cm     LV E/e' medial:  14.2 LV IVS:        1.20 cm     LV e' lateral:   4.35 cm/s LVOT diam:     2.00 cm     LV E/e' lateral: 13.2 LV SV:         67 LV SV Index:   37 LVOT Area:     3.14 cm  LV Volumes (MOD) LV vol d, MOD A2C: 80.0 ml LV vol d, MOD A4C: 64.2 ml LV vol s, MOD A2C: 30.9 ml LV vol s, MOD A4C: 24.6 ml LV SV MOD A2C:     49.1 ml LV SV MOD A4C:     64.2 ml LV SV MOD BP:      46.3 ml  RIGHT VENTRICLE             IVC RV Basal diam:  3.20 cm     IVC diam: 1.10 cm RV Mid diam:    2.50 cm RV S prime:     12.00 cm/s TAPSE (M-mode): 2.1 cm  LEFT ATRIUM             Index        RIGHT ATRIUM           Index LA diam:        3.20 cm 1.79 cm/m   RA Area:     13.60 cm LA Vol (A2C):   39.8 ml 22.25 ml/m  RA Volume:   29.60 ml  16.55 ml/m LA Vol (A4C):   37.6 ml 21.02 ml/m LA Biplane Vol: 39.2 ml 21.91 ml/m AORTIC VALVE AV Area (Vmax):  2.05 cm AV Vmax:        149.00 cm/s AV Peak Grad:   8.9 mmHg LVOT Vmax:      97.30 cm/s LVOT Vmean:     76.900 cm/s LVOT VTI:       0.212 m  AORTA Ao Root diam: 2.80 cm Ao Asc diam:  2.80 cm  MITRAL VALVE MV Area (PHT): 3.07 cm    SHUNTS MV Decel Time: 247 msec    Systemic VTI:  0.21 m MV E velocity: 57.40 cm/s  Systemic Diam: 2.00 cm MV A velocity: 91.30 cm/s MV E/A ratio:  0.63  Powell Sorrow MD Electronically signed by Powell Sorrow MD Signature Date/Time: 07/14/2021/2:27:30 PM    Final          ______________________________________________________________________________________________       EKG:  EKG is not ordered today, reviewed EKG from ED visit on 07/05/2022, normal sinus rhythm with left axis deviation, heart rate 76 bpm, consistent with prior EKG tracings.  Recent Labs: 09/29/2023: ALT 11; BUN 17; Creatinine, Ser 0.80; Hemoglobin 12.8; Platelets 248.0; Potassium 4.0; Sodium 142  Recent Lipid Panel    Component Value Date/Time   CHOL 156 09/29/2023 0849   CHOL 93 (L) 04/20/2023 1100   TRIG 59.0 09/29/2023 0849   HDL 55.50 09/29/2023 0849   HDL 47 04/20/2023 1100   CHOLHDL 3 09/29/2023 0849   VLDL 11.8 09/29/2023 0849   LDLCALC 89 09/29/2023 0849   LDLCALC 33 04/20/2023 1100     Risk Assessment/Calculations:                Physical Exam:    VS:  BP 122/60   Pulse 85   Ht 5' 1 (1.549 m)   Wt 143 lb 6.4 oz (65 kg)   LMP 10/17/1991 (Exact Date)   SpO2  94%   BMI 27.10 kg/m     Wt Readings from Last 3 Encounters:  11/02/23 143 lb 6.4 oz (65 kg)  09/29/23 143 lb 3.2 oz (65 kg)  07/30/23 149 lb (67.6 kg)     GEN: appears younger than stated age,  well nourished, well developed in no acute distress HEENT: Normal NECK: No JVD; No carotid bruits LYMPHATICS: No lymphadenopathy CARDIAC: RRR, no murmurs, rubs, gallops RESPIRATORY:  Clear to auscultation without rales, wheezing or rhonchi  ABDOMEN: Soft, non-tender,  non-distended MUSCULOSKELETAL:  No edema; No deformity.  Left chest wall with bandage, exquisitely tender to palpation or movement. SKIN: Warm and dry NEUROLOGIC:  Alert and oriented x 3 PSYCHIATRIC:  Normal affect   ASSESSMENT:    1. Coronary artery disease involving native coronary artery of native heart without angina pectoris   2. Heart failure with improved ejection fraction (HFimpEF) (HCC)   3. Mixed hyperlipidemia   4. Essential hypertension   5. Precordial pain      PLAN:    In order of problems listed above:  CAD -s/p LHC in 2012 revealing moderate stenosis of LAD.  She had a Lexiscan  in October 2024 which was negative.  She is having occasional episodes consistent with stable angina. Will arrange for a coronary CTA. Continue aspirin 81 mg daily, continue Crestor  20 mg every other day, continue nitroglycerin  as needed.    HFimpEF-most recent echo in 2023 revealed EF 60 to 65%, grade 1 DD, moderate concentric LVH.  NYHA class I, euvolemic.  Continue Jardiance  10 mg daily, valsartan  80 mg daily.  Currently not on beta-blocker therapy, although it appears she was previously on metoprolol  back in 2013.  Hypertension-blood pressure is well-controlled today at 122/60, continue valsartan  80 mg daily.  Dyslipidemia-Continue Crestor  20 mg to take Monday Wednesday Friday along with co-Q10.  Recent LDL was 89, with prefer this to be less than 70, will wait to recheck this after see the results of her coronary CTA.      Disposition-coronary CTA, BMET, CBC, premedicate for cCta with metoprolol  100 mg PO x 1. Follow up based on testing if abnormal, otherwise 6 months.        Medication Adjustments/Labs and Tests Ordered: Current medicines are reviewed at length with the patient today.  Concerns regarding medicines are outlined above.  Orders Placed This Encounter  Procedures   CT CORONARY MORPH W/CTA COR W/SCORE W/CA W/CM &/OR WO/CM   CBC   Basic metabolic panel with GFR   EKG  12-Lead   Meds ordered this encounter  Medications   metoprolol  tartrate (LOPRESSOR ) 100 MG tablet    Sig: Take one tablet 2 hours before cardiac CT for heart greater than 55    Dispense:  1 tablet    Refill:  0    Patient Instructions  Medication Instructions:  Your physician recommends that you continue on your current medications as directed. Please refer to the Current Medication list given to you today.  *If you need a refill on your cardiac medications before your next appointment, please call your pharmacy*   Lab Work: CBC, BMP- today If you have labs (blood work) drawn today and your tests are completely normal, you will receive your results only by: MyChart Message (if you have MyChart) OR A paper copy in the mail If you have any lab test that is abnormal or we need to change your treatment, we will call you to review the results.   Testing/Procedures:  Your cardiac  CT will be scheduled at one of the below locations:   [facility] [address] [place], [place] [address]  Please follow these instructions carefully (unless otherwise directed):    On the Night Before the Test: Be sure to Drink plenty of water. Do not consume any caffeinated/decaffeinated beverages or chocolate 12 hours prior to your test. Do not take any antihistamines 12 hours prior to your test.   On the Day of the Test: Drink plenty of water until 1 hour prior to the test. Do not eat any food 4 hours prior to the test. You may take your regular medications prior to the test.  Take metoprolol  (Lopressor ) two hours prior to test. HOLD Furosemide/Hydrochlorothiazide  morning of the test. FEMALES- please wear underwire-free bra if available, avoid dresses & tight clothin       After the Test: Drink plenty of water. After receiving IV contrast, you may experience a mild flushed feeling. This is normal. On occasion, you may experience a mild rash up to 24 hours after the test. This  is not dangerous. If this occurs, you can take Benadryl 25 mg and increase your fluid intake. If you experience trouble breathing, this can be serious. If it is severe call 911 IMMEDIATELY. If it is mild, please call our office. If you take any of these medications: Glipizide /Metformin , Avandament, Glucavance, please do not take 48 hours after completing test unless otherwise instructed.  We will call to schedule your test 2-4 weeks out understanding that some insurance companies will need an authorization prior to the service being performed.   For non-scheduling related questions, please contact the cardiac imaging nurse navigator should you have any questions/concerns: Camie Shutter, Cardiac Imaging Nurse Navigator Chantal Requena, Cardiac Imaging Nurse Navigator [facility] Direct Office Dial: [phone]   For scheduling needs, including cancellations and rescheduling, please call [place], [phone].    Follow-Up: At [facility], you and your health needs are our priority.  As part of our continuing mission to provide you with exceptional heart care, we have created designated Provider Care Teams.  These Care Teams include your primary Cardiologist (physician) and Advanced Practice Providers (APPs -  Physician Assistants and Nurse Practitioners) who all work together to provide you with the care you need, when you need it.  We recommend signing up for the patient portal called MyChart.  Sign up information is provided on this After Visit Summary.  MyChart is used to connect with patients for Virtual Visits (Telemedicine).  Patients are able to view lab/test results, encounter notes, upcoming appointments, etc.  Non-urgent messages can be sent to your provider as well.   To learn more about what you can do with MyChart, go to [email or web address].    Your next appointment:   6 month(s)  The format for your next appointment:   In Person  Provider:    Delon Hoover, NP   Other Instructions NA     Signed, Delon JAYSON Hoover, NP  11/02/2023 12:05 PM    [facility]

## 2023-11-02 ENCOUNTER — Ambulatory Visit: Attending: Cardiology | Admitting: Cardiology

## 2023-11-02 ENCOUNTER — Other Ambulatory Visit: Payer: Self-pay

## 2023-11-02 VITALS — BP 122/60 | HR 85 | Ht 61.0 in | Wt 143.4 lb

## 2023-11-02 DIAGNOSIS — E782 Mixed hyperlipidemia: Secondary | ICD-10-CM | POA: Diagnosis not present

## 2023-11-02 DIAGNOSIS — R072 Precordial pain: Secondary | ICD-10-CM

## 2023-11-02 DIAGNOSIS — E049 Nontoxic goiter, unspecified: Secondary | ICD-10-CM | POA: Insufficient documentation

## 2023-11-02 DIAGNOSIS — E119 Type 2 diabetes mellitus without complications: Secondary | ICD-10-CM | POA: Insufficient documentation

## 2023-11-02 DIAGNOSIS — I10 Essential (primary) hypertension: Secondary | ICD-10-CM | POA: Diagnosis not present

## 2023-11-02 DIAGNOSIS — I5032 Chronic diastolic (congestive) heart failure: Secondary | ICD-10-CM | POA: Diagnosis not present

## 2023-11-02 DIAGNOSIS — Z853 Personal history of malignant neoplasm of breast: Secondary | ICD-10-CM | POA: Insufficient documentation

## 2023-11-02 DIAGNOSIS — E785 Hyperlipidemia, unspecified: Secondary | ICD-10-CM | POA: Insufficient documentation

## 2023-11-02 DIAGNOSIS — Z5189 Encounter for other specified aftercare: Secondary | ICD-10-CM | POA: Insufficient documentation

## 2023-11-02 DIAGNOSIS — I2510 Atherosclerotic heart disease of native coronary artery without angina pectoris: Secondary | ICD-10-CM | POA: Diagnosis not present

## 2023-11-02 DIAGNOSIS — M47816 Spondylosis without myelopathy or radiculopathy, lumbar region: Secondary | ICD-10-CM | POA: Insufficient documentation

## 2023-11-02 DIAGNOSIS — K219 Gastro-esophageal reflux disease without esophagitis: Secondary | ICD-10-CM | POA: Insufficient documentation

## 2023-11-02 DIAGNOSIS — M47812 Spondylosis without myelopathy or radiculopathy, cervical region: Secondary | ICD-10-CM | POA: Insufficient documentation

## 2023-11-02 DIAGNOSIS — T7840XA Allergy, unspecified, initial encounter: Secondary | ICD-10-CM | POA: Insufficient documentation

## 2023-11-02 DIAGNOSIS — K5790 Diverticulosis of intestine, part unspecified, without perforation or abscess without bleeding: Secondary | ICD-10-CM | POA: Insufficient documentation

## 2023-11-02 DIAGNOSIS — C801 Malignant (primary) neoplasm, unspecified: Secondary | ICD-10-CM | POA: Insufficient documentation

## 2023-11-02 DIAGNOSIS — Z87442 Personal history of urinary calculi: Secondary | ICD-10-CM | POA: Insufficient documentation

## 2023-11-02 DIAGNOSIS — I5020 Unspecified systolic (congestive) heart failure: Secondary | ICD-10-CM

## 2023-11-02 DIAGNOSIS — F339 Major depressive disorder, recurrent, unspecified: Secondary | ICD-10-CM | POA: Insufficient documentation

## 2023-11-02 MED ORDER — METOPROLOL TARTRATE 100 MG PO TABS: ORAL_TABLET | ORAL | 0 refills | Status: AC

## 2023-11-02 NOTE — Patient Instructions (Addendum)
 Medication Instructions:  Your physician recommends that you continue on your current medications as directed. Please refer to the Current Medication list given to you today.  *If you need a refill on your cardiac medications before your next appointment, please call your pharmacy*   Lab Work: CBC, BMP- today If you have labs (blood work) drawn today and your tests are completely normal, you will receive your results only by: MyChart Message (if you have MyChart) OR A paper copy in the mail If you have any lab test that is abnormal or we need to change your treatment, we will call you to review the results.   Testing/Procedures:  Your cardiac CT will be scheduled at one of the below locations:   [facility] [address] [place], [place] [address]  Please follow these instructions carefully (unless otherwise directed):    On the Night Before the Test: Be sure to Drink plenty of water. Do not consume any caffeinated/decaffeinated beverages or chocolate 12 hours prior to your test. Do not take any antihistamines 12 hours prior to your test.   On the Day of the Test: Drink plenty of water until 1 hour prior to the test. Do not eat any food 4 hours prior to the test. You may take your regular medications prior to the test.  Take metoprolol  (Lopressor ) two hours prior to test. HOLD Furosemide/Hydrochlorothiazide  morning of the test. FEMALES- please wear underwire-free bra if available, avoid dresses & tight clothin       After the Test: Drink plenty of water. After receiving IV contrast, you may experience a mild flushed feeling. This is normal. On occasion, you may experience a mild rash up to 24 hours after the test. This is not dangerous. If this occurs, you can take Benadryl 25 mg and increase your fluid intake. If you experience trouble breathing, this can be serious. If it is severe call 911 IMMEDIATELY. If it is mild, please call our office. If you take any  of these medications: Glipizide /Metformin , Avandament, Glucavance, please do not take 48 hours after completing test unless otherwise instructed.  We will call to schedule your test 2-4 weeks out understanding that some insurance companies will need an authorization prior to the service being performed.   For non-scheduling related questions, please contact the cardiac imaging nurse navigator should you have any questions/concerns: Camie Shutter, Cardiac Imaging Nurse Navigator Chantal Requena, Cardiac Imaging Nurse Navigator [facility] Direct Office Dial: [phone]   For scheduling needs, including cancellations and rescheduling, please call [place], [phone].    Follow-Up: At [facility], you and your health needs are our priority.  As part of our continuing mission to provide you with exceptional heart care, we have created designated Provider Care Teams.  These Care Teams include your primary Cardiologist (physician) and Advanced Practice Providers (APPs -  Physician Assistants and Nurse Practitioners) who all work together to provide you with the care you need, when you need it.  We recommend signing up for the patient portal called MyChart.  Sign up information is provided on this After Visit Summary.  MyChart is used to connect with patients for Virtual Visits (Telemedicine).  Patients are able to view lab/test results, encounter notes, upcoming appointments, etc.  Non-urgent messages can be sent to your provider as well.   To learn more about what you can do with MyChart, go to [email or web address].    Your next appointment:   6 month(s)  The format  for your next appointment:   In Person  Provider:   Delon Hoover, NP   Other Instructions NA

## 2023-11-03 ENCOUNTER — Ambulatory Visit: Payer: Self-pay | Admitting: Cardiology

## 2023-11-03 ENCOUNTER — Telehealth: Payer: Self-pay

## 2023-11-03 DIAGNOSIS — I2510 Atherosclerotic heart disease of native coronary artery without angina pectoris: Secondary | ICD-10-CM

## 2023-11-03 DIAGNOSIS — E782 Mixed hyperlipidemia: Secondary | ICD-10-CM

## 2023-11-03 LAB — CBC
Hematocrit: 43.1 % (ref 34.0–46.6)
Hemoglobin: 12.4 g/dL (ref 11.1–15.9)
MCH: 22.3 pg — ABNORMAL LOW (ref 26.6–33.0)
MCHC: 28.8 g/dL — ABNORMAL LOW (ref 31.5–35.7)
MCV: 78 fL — ABNORMAL LOW (ref 79–97)
Platelets: 271 x10E3/uL (ref 150–450)
RBC: 5.55 x10E6/uL — ABNORMAL HIGH (ref 3.77–5.28)
RDW: 14.9 % (ref 11.7–15.4)
WBC: 10.8 x10E3/uL (ref 3.4–10.8)

## 2023-11-03 LAB — BASIC METABOLIC PANEL WITH GFR
BUN/Creatinine Ratio: 19 (ref 12–28)
BUN: 14 mg/dL (ref 8–27)
CO2: 23 mmol/L (ref 20–29)
Calcium: 9.9 mg/dL (ref 8.7–10.3)
Chloride: 104 mmol/L (ref 96–106)
Creatinine, Ser: 0.75 mg/dL (ref 0.57–1.00)
Glucose: 139 mg/dL — ABNORMAL HIGH (ref 70–99)
Potassium: 4.2 mmol/L (ref 3.5–5.2)
Sodium: 141 mmol/L (ref 134–144)
eGFR: 82 mL/min/1.73

## 2023-11-03 NOTE — Telephone Encounter (Signed)
 Labs for CT Angio - Routed to PCP.

## 2023-11-05 ENCOUNTER — Encounter (HOSPITAL_COMMUNITY): Payer: Self-pay

## 2023-11-08 ENCOUNTER — Telehealth (HOSPITAL_COMMUNITY): Payer: Self-pay | Admitting: *Deleted

## 2023-11-08 NOTE — Telephone Encounter (Signed)

## 2023-11-09 ENCOUNTER — Ambulatory Visit (HOSPITAL_BASED_OUTPATIENT_CLINIC_OR_DEPARTMENT_OTHER)
Admission: RE | Admit: 2023-11-09 | Discharge: 2023-11-09 | Disposition: A | Discharge status: Home / Self Care | Source: Ambulatory Visit | Attending: Cardiology | Admitting: Cardiology

## 2023-11-09 DIAGNOSIS — R072 Precordial pain: Secondary | ICD-10-CM | POA: Insufficient documentation

## 2023-11-09 MED ORDER — IOHEXOL 350 MG/ML SOLN
100.0000 mL | Freq: Once | INTRAVENOUS | Status: AC | PRN
  Administered 2023-11-09: 95 mL via INTRAVENOUS

## 2023-11-09 MED ORDER — NITROGLYCERIN 0.4 MG SL SUBL
0.8000 mg | SUBLINGUAL_TABLET | Freq: Once | SUBLINGUAL | Status: AC
  Administered 2023-11-09: 0.8 mg via SUBLINGUAL

## 2023-11-11 ENCOUNTER — Other Ambulatory Visit: Payer: Self-pay | Admitting: Cardiology

## 2023-11-11 ENCOUNTER — Ambulatory Visit (HOSPITAL_BASED_OUTPATIENT_CLINIC_OR_DEPARTMENT_OTHER)
Admission: RE | Admit: 2023-11-11 | Discharge: 2023-11-11 | Disposition: A | Discharge status: Home / Self Care | Source: Ambulatory Visit | Attending: Cardiology | Admitting: Cardiology

## 2023-11-11 DIAGNOSIS — R1115 Cyclical vomiting syndrome unrelated to migraine: Secondary | ICD-10-CM

## 2023-11-11 DIAGNOSIS — R079 Chest pain, unspecified: Secondary | ICD-10-CM | POA: Insufficient documentation

## 2023-11-11 NOTE — Progress Notes (Signed)
 FR Order

## 2023-11-12 MED ORDER — ISOSORBIDE MONONITRATE ER 30 MG PO TB24: 30.0000 mg | ORAL_TABLET | Freq: Every day | ORAL | 3 refills | Status: DC

## 2023-11-12 NOTE — Telephone Encounter (Signed)
-----   Message from Delon JAYSON Hoover sent at 11/12/2023  7:16 AM EDT ----- Coronary CTA showed that there is disease in her coronary arteries (which we already knew from previous heart cath), but that there is likely not any narrowing that is severe enough to cause chest  pain.   Have her come back for FLP, LP(A), and LFTs. We need to get cholesterol as good as we can, which will help with the disease that is already there.   Continue aspirin and she has NTG as needed.  Lets start her on low dose imdur  30 mg each morning. Please review may cause headache for a few weeks, if so this should subside and she can take tylenol  if it bothers her.   Recommend regular physical exercise most days of the week.    ----- Message ----- From: Interface, Rad Results In Sent: 11/11/2023   6:12 PM EDT To: Delon JAYSON Hoover, NP

## 2023-11-12 NOTE — Telephone Encounter (Signed)
Left VM and MyChart message sent.

## 2023-11-17 ENCOUNTER — Ambulatory Visit: Payer: Self-pay | Admitting: Cardiology

## 2023-11-18 DIAGNOSIS — Z96641 Presence of right artificial hip joint: Secondary | ICD-10-CM | POA: Diagnosis not present

## 2023-11-18 DIAGNOSIS — M25551 Pain in right hip: Secondary | ICD-10-CM | POA: Diagnosis not present

## 2023-11-18 DIAGNOSIS — M25561 Pain in right knee: Secondary | ICD-10-CM | POA: Diagnosis not present

## 2023-11-26 ENCOUNTER — Other Ambulatory Visit: Payer: Self-pay | Admitting: Nurse Practitioner

## 2023-12-05 ENCOUNTER — Other Ambulatory Visit: Payer: Self-pay | Admitting: Nurse Practitioner

## 2023-12-06 NOTE — Telephone Encounter (Signed)
 Requesting: Lantus  SoloStar 100 UNIT/ML Subcutaneous Solution Pen-injector  Last Visit: 09/29/2023 Next Visit: Visit date not found Last Refill: 09/28/2023  Please Advise

## 2023-12-11 IMAGING — CT CT CHEST W/ CM
2 of 3 series · 15 of 36 positions shown, 18 images · IV contrast (Omnipaque)
Comparison: None.

CLINICAL DATA: Shortness of breath, chest discomfort. New left
breast mass. History of breast cancer.

EXAM:
CT CHEST WITH CONTRAST
TECHNIQUE: Multidetector CT imaging of the chest was performed during
intravenous contrast administration.

[Series 2: axial st · axial · 0.87mm/px · z∈[-285,-55]mm · 12 of 135 slices shown, 15 images]
[im 10/135  mediastinal]
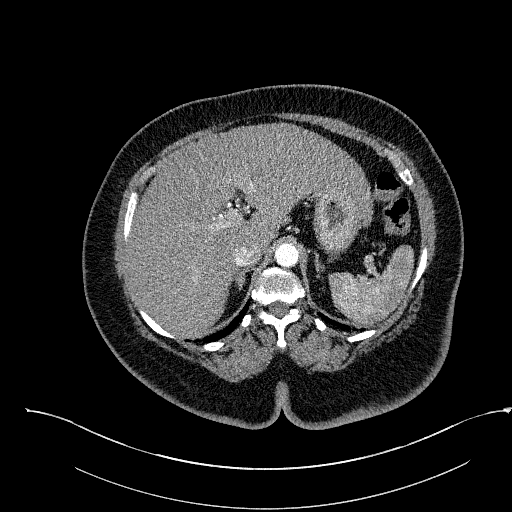
[im 10/135  lung]
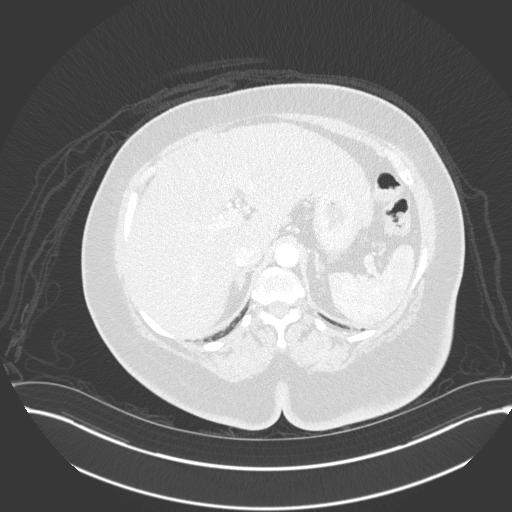
[im 20/135  lung]
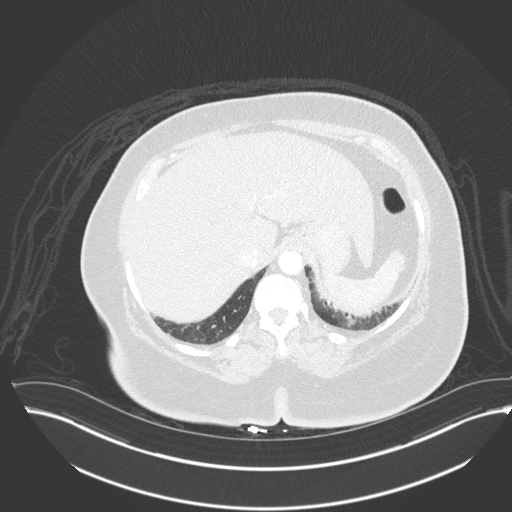
[im 30/135  lung]
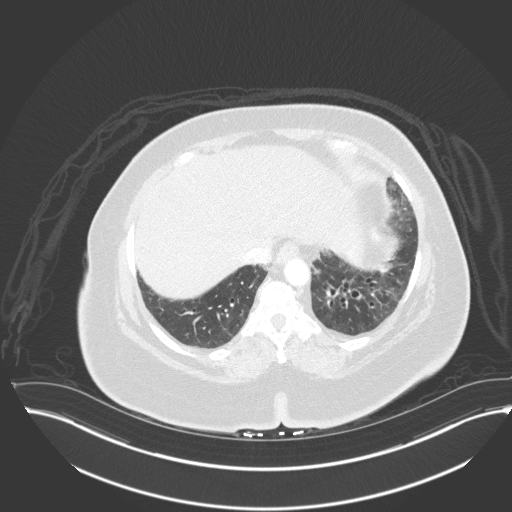
[im 40/135  lung]
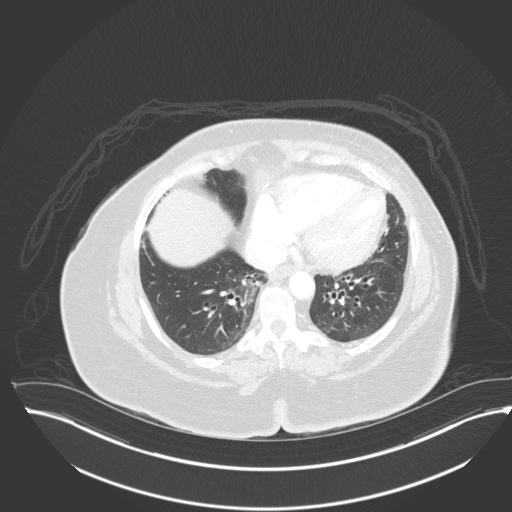
[im 50/135  mediastinal]
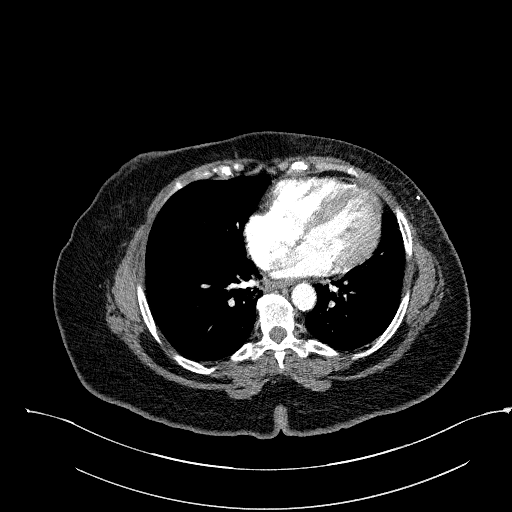
[im 50/135  lung]
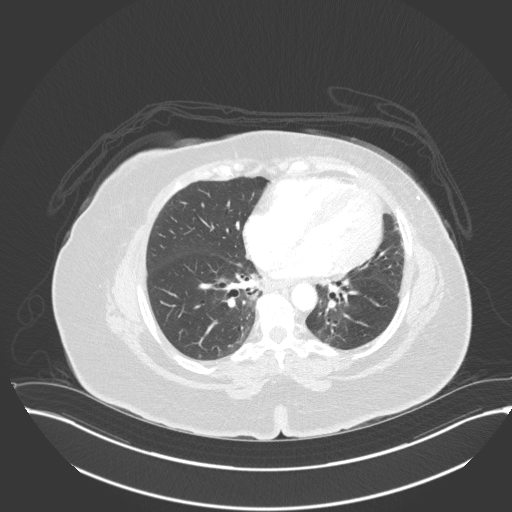
[im 60/135  lung]
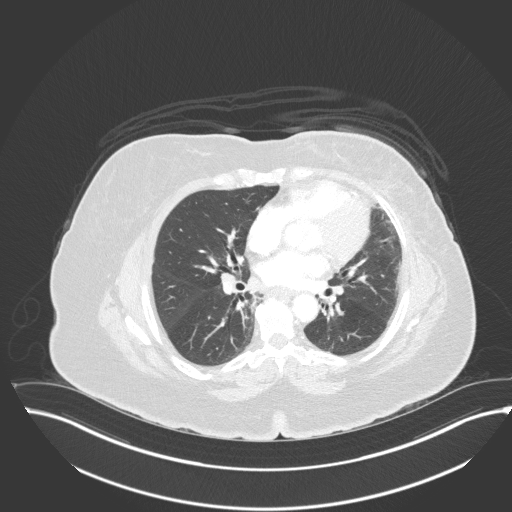
[im 75/135  lung]
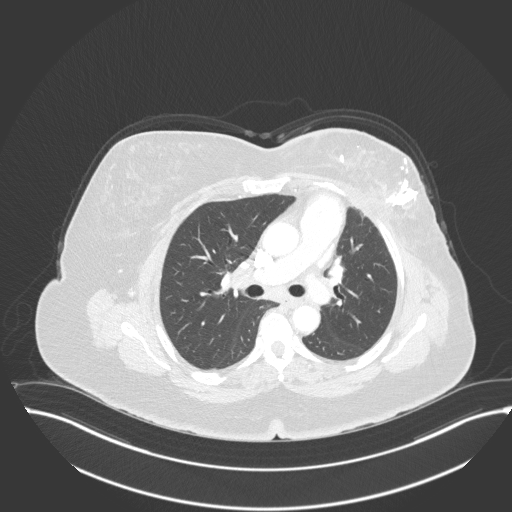
[im 85/135  lung]
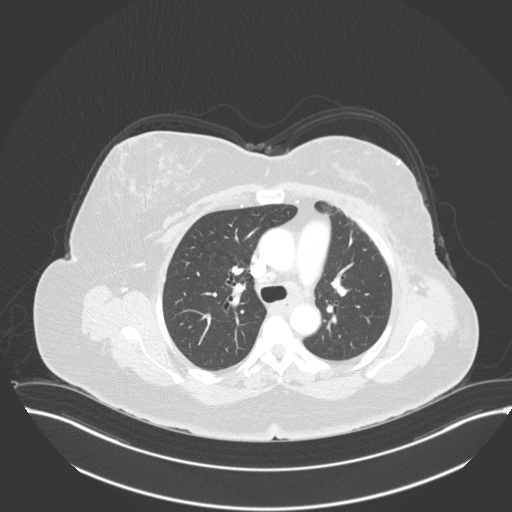
[im 95/135  mediastinal]
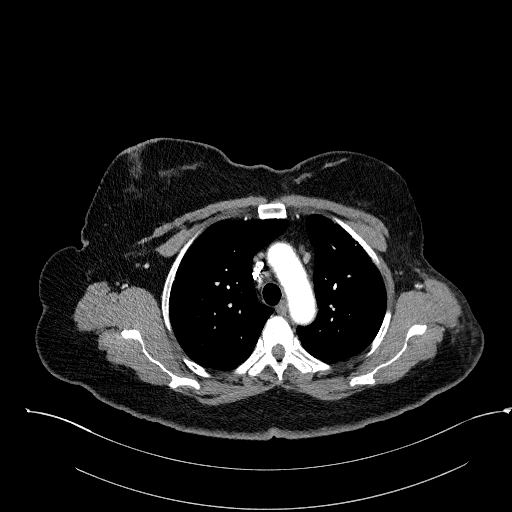
[im 95/135  lung]
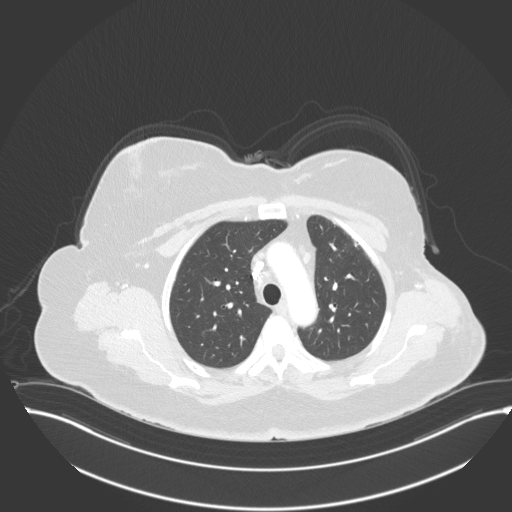
[im 105/135  lung]
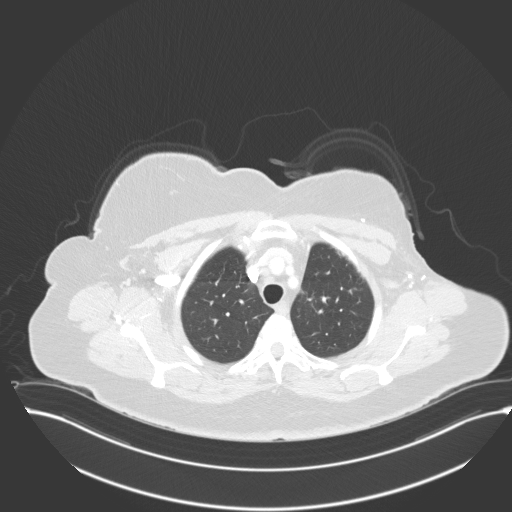
[im 115/135  lung]
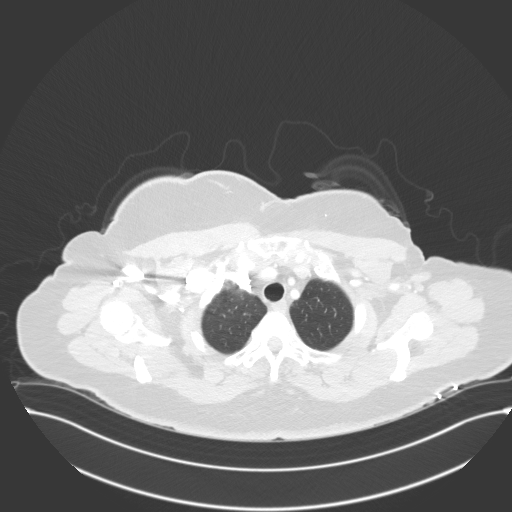
[im 125/135  lung]
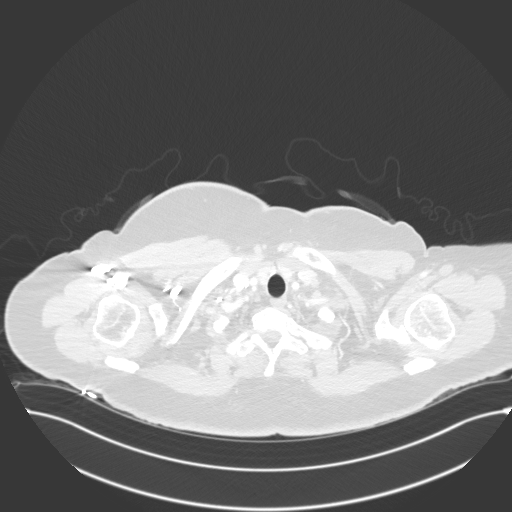

[Series 5: coronal · coronal · 0.53mm/px · 3 of 143 slices shown]
[im 29/143  lung]
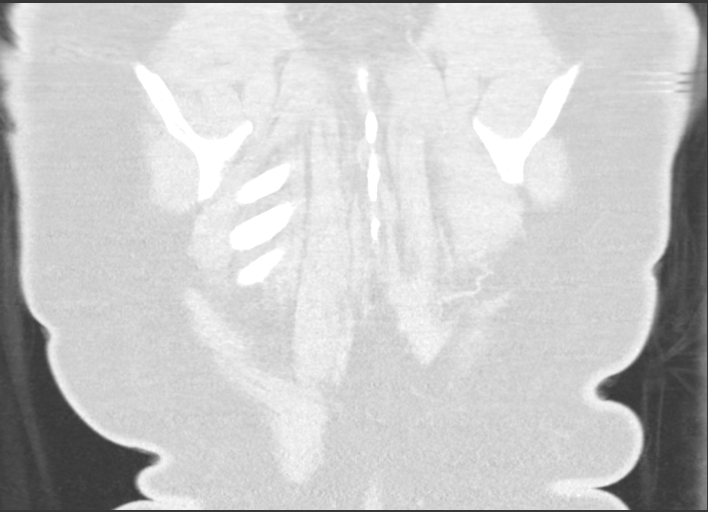
[im 57/143  lung]
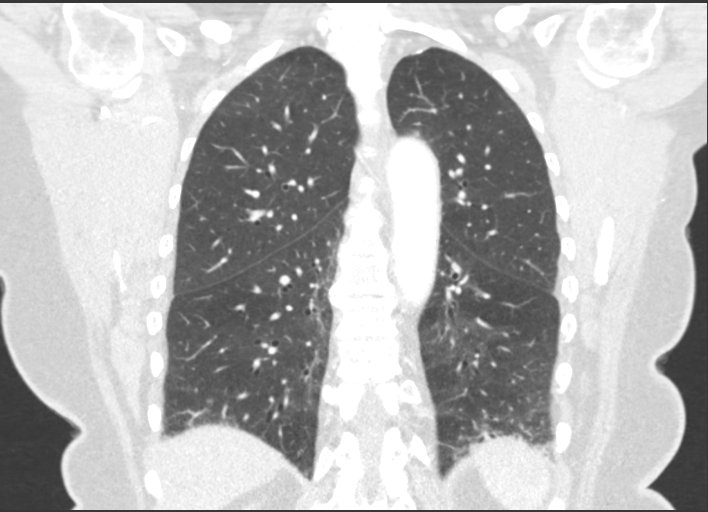
[im 86/143  lung]
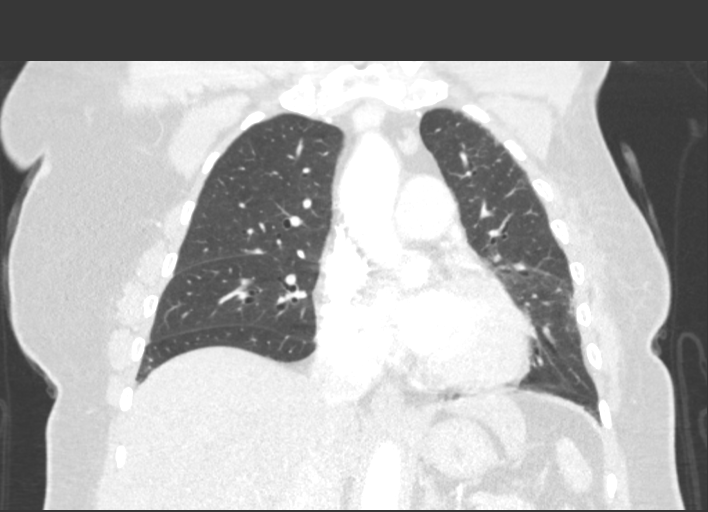

[15 of 36 positions shown; findings below may reference images not displayed]

RADIATION DOSE REDUCTION: This exam was performed according to the
departmental dose-optimization program which includes automated
exposure control, adjustment of the mA and/or kV according to
patient size and/or use of iterative reconstruction technique.

CONTRAST:  80mL OMNIPAQUE IOHEXOL 300 MG/ML  SOLN
FINDINGS: Cardiovascular: Heart is normal size. Aorta is normal caliber.

Mediastinum/Nodes: Borderline sized mediastinal lymph nodes.
Pre-vascular lymph node has a short axis diameter of 8 mm. Mildly
enlarged AP window lymph node with a short axis diameter of 10 mm.
Right paratracheal lymph node has a short axis diameter of 7 mm.
Subcarinal lymph node has a short axis diameter of 10 mm. No hilar
or axillary adenopathy. Trachea and esophagus are unremarkable.
Thyroid unremarkable.

Lungs/Pleura: Thickened interstitial markings peripherally in the
left upper lobe, likely related to prior left breast radiation. No
suspicious pulmonary nodules. No confluent opacities or effusions.

Upper Abdomen: No acute findings. Diffuse low-density throughout the
liver compatible with fatty infiltration.

Musculoskeletal: Postoperative changes in the left breast and
axilla. Small nodule medially in the left breast measures 10 mm on
image 67. Recommend correlation with mammography. No acute bony
abnormality.
IMPRESSION: Mildly enlarged and borderline mediastinal lymph nodes, nonspecific.
These could be reactive, but given history of breast cancer,
recommend follow-up CT in 3-6 months to assess stability.

Small nodule medially in the left breast. Postoperative changes in
the left breast and axilla. Recommend correlation with mammography.

Hepatic steatosis.

## 2023-12-14 ENCOUNTER — Other Ambulatory Visit: Payer: Self-pay | Admitting: Nurse Practitioner

## 2023-12-14 DIAGNOSIS — E1122 Type 2 diabetes mellitus with diabetic chronic kidney disease: Secondary | ICD-10-CM

## 2023-12-14 NOTE — Telephone Encounter (Signed)
 Requesting: OneTouch Verio In Vitro Strip  Last Visit: 09/29/2023 Next Visit: Visit date not found Last Refill: 07/27/2023  Please Advise

## 2023-12-19 ENCOUNTER — Other Ambulatory Visit: Payer: Self-pay | Admitting: Nurse Practitioner

## 2023-12-20 ENCOUNTER — Other Ambulatory Visit: Payer: Self-pay | Admitting: Nurse Practitioner

## 2023-12-21 ENCOUNTER — Telehealth: Payer: Self-pay

## 2023-12-21 ENCOUNTER — Encounter: Payer: Self-pay | Admitting: Dermatology

## 2023-12-21 ENCOUNTER — Ambulatory Visit: Admitting: Dermatology

## 2023-12-21 VITALS — BP 80/46 | HR 86

## 2023-12-21 DIAGNOSIS — D485 Neoplasm of uncertain behavior of skin: Secondary | ICD-10-CM | POA: Diagnosis not present

## 2023-12-21 DIAGNOSIS — L720 Epidermal cyst: Secondary | ICD-10-CM

## 2023-12-21 NOTE — Patient Instructions (Signed)

## 2023-12-21 NOTE — Progress Notes (Signed)
   New Patient Visit   Subjective  Bonnie Reed is a 78 y.o. female who presents for the following: Spot of concern  Patient states spot has been there about a year.  States it showed up after mastectomy and has continued to grow. Doesn't bleed or itch just gets bigger  The following portions of the chart were reviewed this encounter and updated as appropriate: medications, allergies, medical history  Review of Systems:  No other skin or systemic complaints except as noted in HPI or Assessment and Plan.  Objective  Well appearing patient in no apparent distress; mood and affect are within normal limits.  A focused examination was performed of the following areas: chest  Relevant exam findings are noted in the Assessment and Plan. Right Breast 7mm dark brown nodule    Assessment & Plan   NEOPLASM OF UNCERTAIN BEHAVIOR OF SKIN Right Breast Skin / nail biopsy Type of biopsy: tangential   Informed consent: discussed and consent obtained   Timeout: patient name, date of birth, surgical site, and procedure verified   Procedure prep:  Patient was prepped and draped in usual sterile fashion Prep type:  Isopropyl alcohol Anesthesia: the lesion was anesthetized in a standard fashion   Anesthetic:  1% lidocaine  w/ epinephrine  1-100,000 buffered w/ 8.4% NaHCO3 Instrument used: DermaBlade   Hemostasis achieved with: aluminum chloride   Outcome: patient tolerated procedure well   Post-procedure details: sterile dressing applied and wound care instructions given   Dressing type: petrolatum gauze and bandage    Specimen 1 - Surgical pathology Differential Diagnosis: r/o neurofibroma vs df vs other  Check Margins: No  Return if symptoms worsen or fail to improve.  I, Berwyn Lesches, Surg Tech III, am acting as scribe for RUFUS CHRISTELLA HOLY, MD.   Documentation: I have reviewed the above documentation for accuracy and completeness, and I agree with the above.  RUFUS CHRISTELLA HOLY, MD

## 2023-12-21 NOTE — Telephone Encounter (Signed)
 Requesting: Valsartan  80 MG Oral Tablet  Last Visit: 09/29/2023 Next Visit: 04/28/2024 Last Refill: 09/21/2023  Please Advise

## 2023-12-21 NOTE — Telephone Encounter (Signed)
 Requesting: Albuterol  Sulfate HFA 108 (90 Base) MCG/ACT Inhalation Aerosol Solution  Last Visit: 09/29/2023 Next Visit: Visit date not found Last Refill: 10/28/2023  Please Advise

## 2023-12-21 NOTE — Telephone Encounter (Signed)
 Copied from CRM #[id number]. Topic: Clinical - Medication Question >> Dec 21, 2023  3:28 PM Robinson H wrote: Reason for CRM: Patient would like for a prescription for the glucose blood machine(ONETOUCH VERIO) sent to the [facility] pharmacy on file, states hers is broken. Pharmacy advised patient they need a prescription from provider.  Dominika [phone]

## 2023-12-22 ENCOUNTER — Ambulatory Visit: Payer: Self-pay | Admitting: Dermatology

## 2023-12-22 ENCOUNTER — Other Ambulatory Visit: Payer: Self-pay | Admitting: Nurse Practitioner

## 2023-12-22 LAB — SURGICAL PATHOLOGY

## 2023-12-22 MED ORDER — ONETOUCH VERIO FLEX SYSTEM DEVI: 1.0000 | Freq: Every day | 0 refills | Status: DC

## 2023-12-22 NOTE — Addendum Note (Signed)
 Addended by: Oshae Simmering A on: 12/22/2023 10:37 AM   Modules accepted: Orders

## 2023-12-22 NOTE — Telephone Encounter (Signed)
 Rx sent to pharmacy

## 2023-12-22 NOTE — Addendum Note (Signed)
 Addended by: Adie Vilar A on: 12/22/2023 10:35 AM   Modules accepted: Orders

## 2023-12-25 ENCOUNTER — Other Ambulatory Visit: Payer: Self-pay | Admitting: Nurse Practitioner

## 2023-12-27 NOTE — Telephone Encounter (Signed)
 Requesting: PEN NEEDLES 32GX4MM MIS  Last Visit: 09/29/2023 Next Visit: Visit date not found Last Refill: 08/26/2023  Please Advise

## 2023-12-29 ENCOUNTER — Other Ambulatory Visit

## 2024-01-07 ENCOUNTER — Encounter: Payer: Self-pay | Admitting: Nurse Practitioner

## 2024-01-07 ENCOUNTER — Ambulatory Visit (INDEPENDENT_AMBULATORY_CARE_PROVIDER_SITE_OTHER): Admitting: Nurse Practitioner

## 2024-01-07 VITALS — BP 128/62 | HR 75 | Temp 97.0°F | Wt 145.2 lb

## 2024-01-07 DIAGNOSIS — I10 Essential (primary) hypertension: Secondary | ICD-10-CM

## 2024-01-07 DIAGNOSIS — J0140 Acute pansinusitis, unspecified: Secondary | ICD-10-CM | POA: Diagnosis not present

## 2024-01-07 MED ORDER — VALSARTAN 40 MG PO TABS: 40.0000 mg | ORAL_TABLET | Freq: Every day | ORAL | 3 refills | Status: AC

## 2024-01-07 MED ORDER — FLUCONAZOLE 150 MG PO TABS: 150.0000 mg | ORAL_TABLET | Freq: Once | ORAL | 0 refills | Status: AC

## 2024-01-07 MED ORDER — ISOSORBIDE MONONITRATE ER 30 MG PO TB24: 30.0000 mg | ORAL_TABLET | Freq: Every day | ORAL | Status: AC

## 2024-01-07 MED ORDER — AMOXICILLIN-POT CLAVULANATE 875-125 MG PO TABS: 1.0000 | ORAL_TABLET | Freq: Two times a day (BID) | ORAL | 0 refills | Status: DC

## 2024-01-07 NOTE — Progress Notes (Unsigned)
   Acute Office Visit  Subjective:     Patient ID: Bonnie Reed, female    DOB: 02/17/1946, 78 y.o.   MRN: [id number]  Chief Complaint  Patient presents with   Sinusitis    Sinus issues for 2 weeks, concerns with BP running low for 3 weeks    HPI Patient is in today for ***  ROS      Objective:    BP 128/62 (BP Location: Left Arm, Patient Position: Sitting, Cuff Size: Normal)   Pulse 75   Temp (!) 97 F (36.1 C)   Wt 145 lb 3.2 oz (65.9 kg)   LMP 10/17/1991 (Exact Date)   SpO2 97%   BMI 27.44 kg/m  {Vitals History (Optional):23777}  Physical Exam  No results found for any visits on 01/07/24.      Assessment & Plan:   Problem List Items Addressed This Visit   None   No orders of the defined types were placed in this encounter.   No follow-ups on file.  Tinnie DELENA Harada, NP

## 2024-01-07 NOTE — Patient Instructions (Addendum)
 It was great to see you!  Start augmentin  twice a day for 10 days  Keep doing the nasal spray   Take diflucan  after finishing antibiotic   Decrease imdur  to 1/2 tablet and valsartan  to 40mg    Let's follow-up in 3 weeks, sooner if you have concerns.  If a referral was placed today, you will be contacted for an appointment. Please note that routine referrals can sometimes take up to 3-4 weeks to process. Please call our office if you haven't heard anything after this time frame.  Take care,  Tinnie Harada, NP

## 2024-01-08 NOTE — Assessment & Plan Note (Signed)
 Chronic, not controlled. Her blood pressure has been low in the 80s and 90s since starting imdur . She stopped taking the valsartan  and her blood pressure went up to the low 100s. She denies dizziness. Will decrease valsartan  to 40mg  daily and imdur  to 15mg  daily. Advise close monitoring of blood pressure. Contact cardiologist regarding low blood pressure.

## 2024-01-14 ENCOUNTER — Telehealth: Payer: Self-pay

## 2024-01-14 MED ORDER — DOXYCYCLINE HYCLATE 100 MG PO TABS: 100.0000 mg | ORAL_TABLET | Freq: Two times a day (BID) | ORAL | 0 refills | Status: AC

## 2024-01-14 NOTE — Telephone Encounter (Signed)
 Copied from CRM [id number]. Topic: Clinical - Medication Question >> Jan 14, 2024 12:04 PM Aisha D wrote: Reason for CRM: Pt stated that the amoxicillin -clavulanate is not working for her sinuses anymore and would like to get an alternative medication or wants to know what Dr.McElwee recommends. Pt would like a callback with an update.

## 2024-01-14 NOTE — Telephone Encounter (Signed)
 Please see message below.   Thanks,  Nataliah Hatlestad CCMA

## 2024-01-17 ENCOUNTER — Other Ambulatory Visit: Payer: Self-pay | Admitting: Nurse Practitioner

## 2024-01-17 DIAGNOSIS — E1122 Type 2 diabetes mellitus with diabetic chronic kidney disease: Secondary | ICD-10-CM

## 2024-01-17 DIAGNOSIS — E118 Type 2 diabetes mellitus with unspecified complications: Secondary | ICD-10-CM

## 2024-01-22 ENCOUNTER — Other Ambulatory Visit: Payer: Self-pay | Admitting: Nurse Practitioner

## 2024-01-22 IMAGING — MR MR BREAST BILAT WO/W CM
8 of 12 series · 31 of 48 positions shown · IV contrast (gadavist)
Comparison: Previous exam(s).

CLINICAL DATA: Recent ultrasound-guided core biopsy of mass in the
7 o'clock location of the LEFT breast shows invasive mammary
carcinoma. Patient has history of LEFT lumpectomy for carcinoma in
4115. CT of the chest performed on 06/04/2021 revealed a mass in the
MEDIAL aspect of the LEFT breast.

EXAM:
BILATERAL BREAST MRI WITH AND WITHOUT CONTRAST
TECHNIQUE: Multiplanar, multisequence MR images of both breasts were obtained
prior to and following the intravenous administration of 10 ml of
Gadavist

[Series 3: t2_tirm_tra ipat (a-p) · axial · 3.0mm · 0.70mm/px · 1 of 60 slices shown]
[im 1/60]
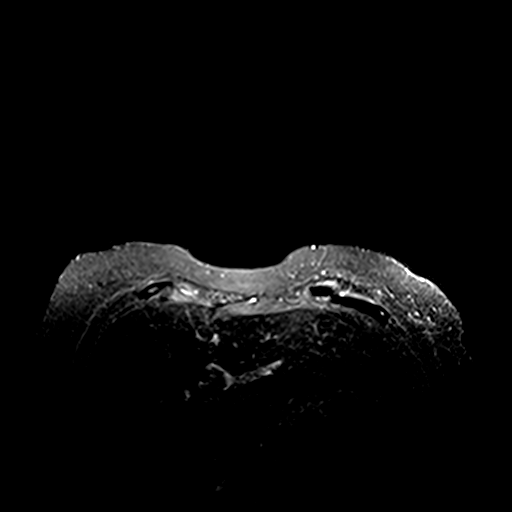

[Series 4: fl3d pre-cm non · axial · non-contrast · 1.2mm · 0.94mm/px · z∈[-83,+88]mm · 5 of 139 slices shown]
[im 1/139]
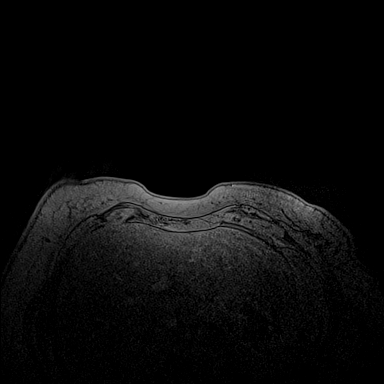
[im 35/139]
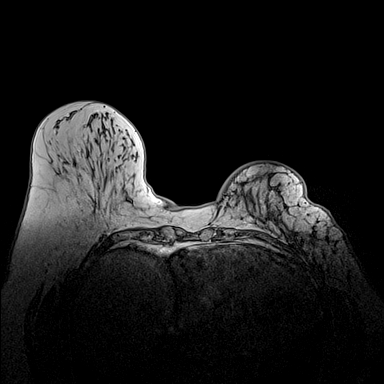
[im 70/139]
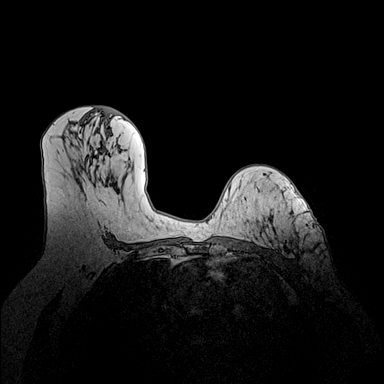
[im 104/139]
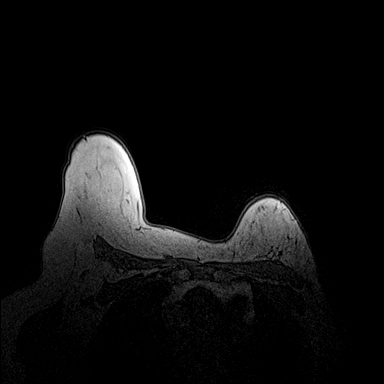
[im 139/139]
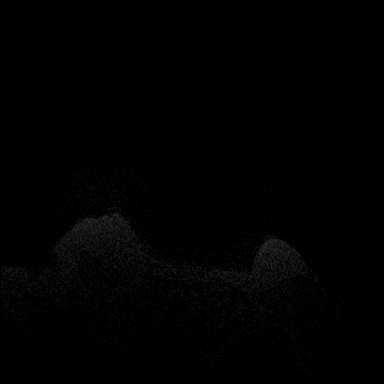

[Series 5: fl3d pre-cm · axial · non-contrast · 1.2mm · 0.94mm/px · z∈[-83,+88]mm · 5 of 143 slices shown]
[im 1/143]
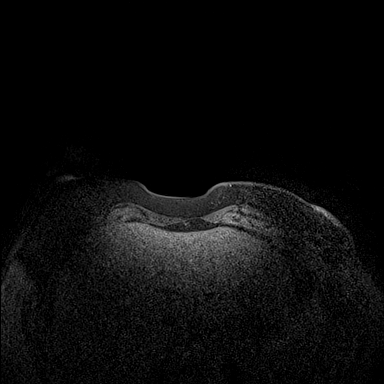
[im 36/143]
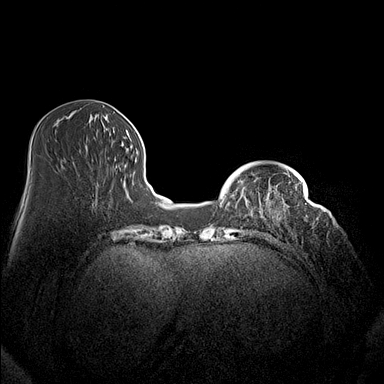
[im 72/143]
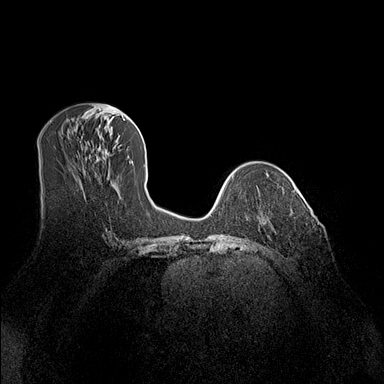
[im 107/143]
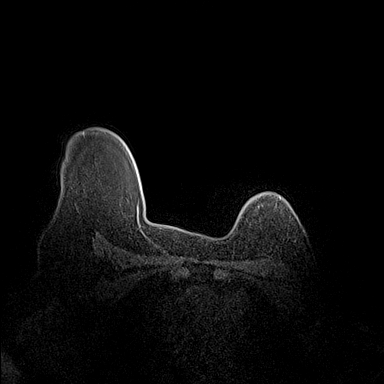
[im 143/143]
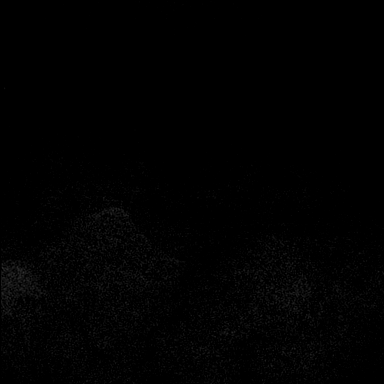

[Series 6: fl3d pre-cm 20 · axial · non-contrast · 1.2mm · 0.94mm/px · z∈[-83,+88]mm · 5 of 144 slices shown (1 of 3)]
[im 1/144]
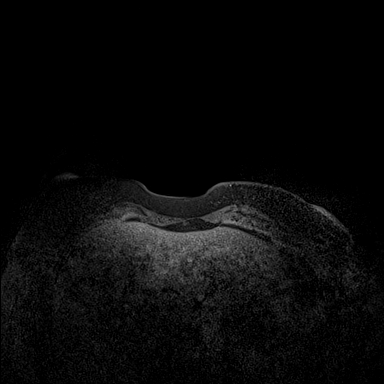
[im 36/144]
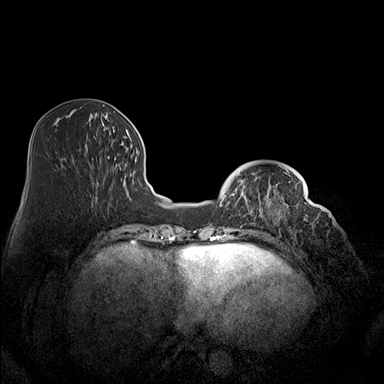
[im 72/144]
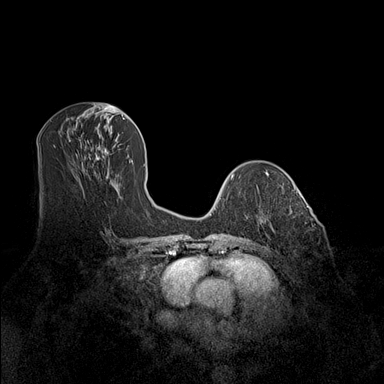
[im 108/144]
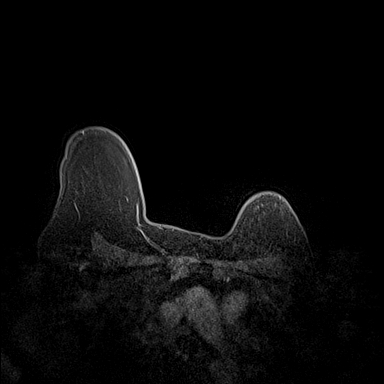
[im 144/144]
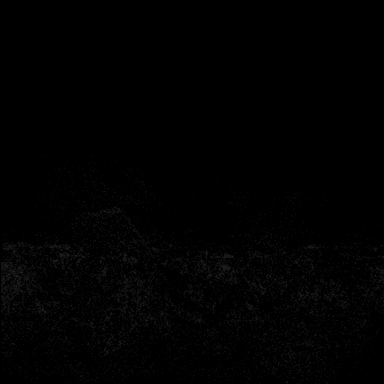

[Series 7: fl3d pre-cm 20 · axial · non-contrast · 1.2mm · 0.94mm/px · z∈[-83,+88]mm · 5 of 144 slices shown (2 of 3)]
[im 1/144]
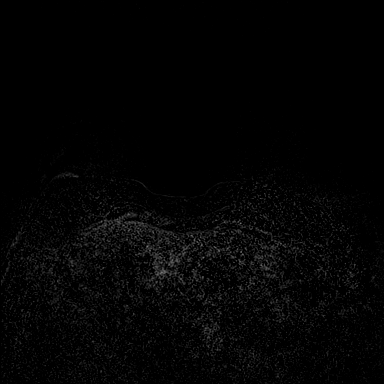
[im 36/144]
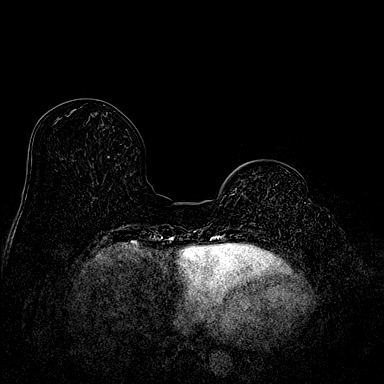
[im 72/144]
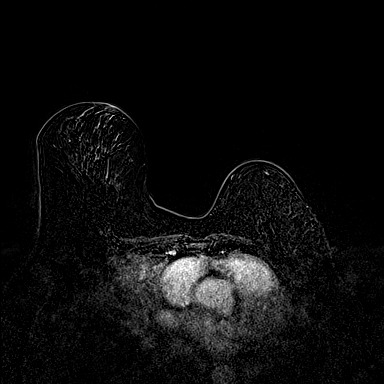
[im 108/144]
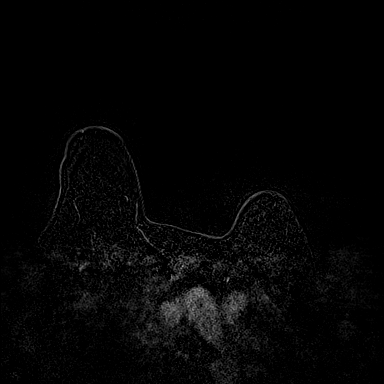
[im 144/144]
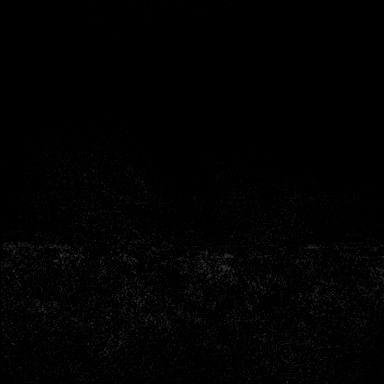

[Series 8: fl3d pre-cm 20 · axial · non-contrast · 172.8mm · 0.94mm/px · 1 of 1 slices shown (3 of 3)]
[im 1/1]
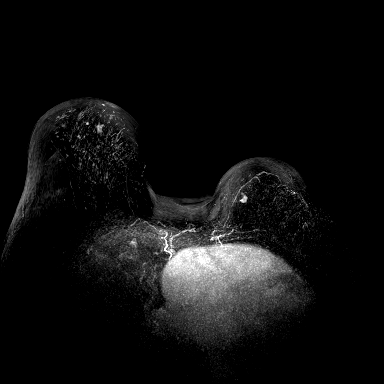

[Series 9: fl3d pre-cm 3min · axial · non-contrast · 1.2mm · 0.94mm/px · z∈[-83,+88]mm · 6 of 144 slices shown]
[im 1/144]
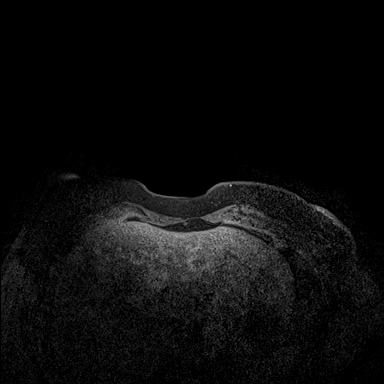
[im 29/144]
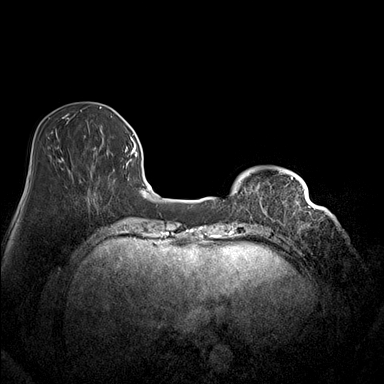
[im 58/144]
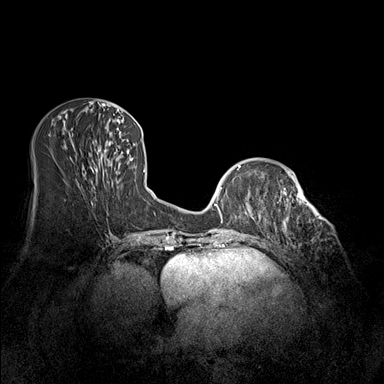
[im 86/144]
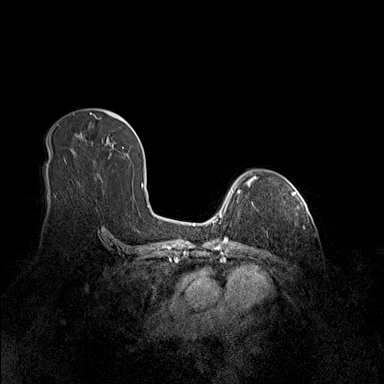
[im 115/144]
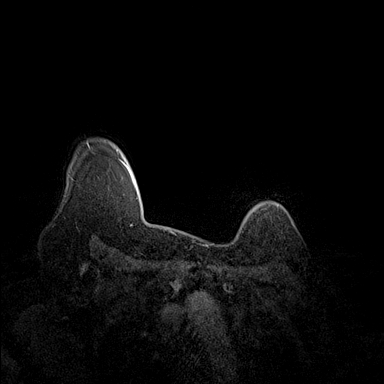
[im 144/144]
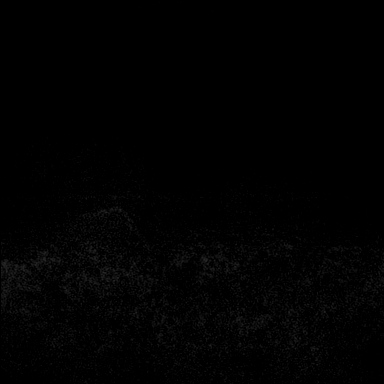

[Series 10: fl3d pre-cm 3min_sub · axial · non-contrast · 1.2mm · 0.94mm/px · z∈[-83,-15]mm · 3 of 144 slices shown]
[im 1/144]
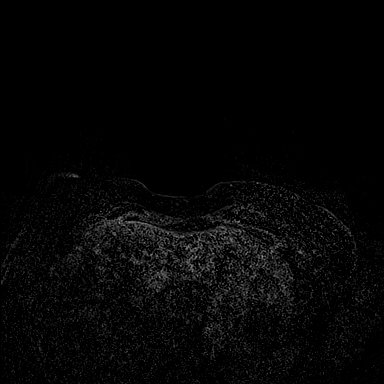
[im 29/144]
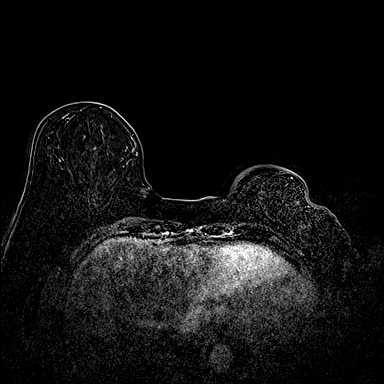
[im 58/144]
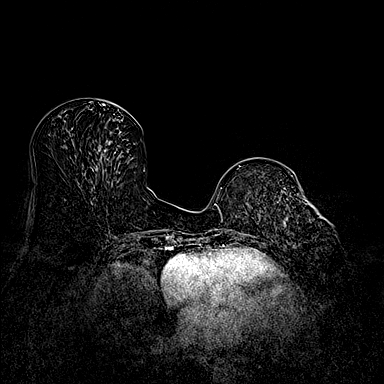

[31 of 48 positions shown; findings below may reference images not displayed]

Three-dimensional MR images were rendered by post-processing of the
original MR data on an independent workstation. The
three-dimensional MR images were interpreted, and findings are
reported in the following complete MRI report for this study. Three
dimensional images were evaluated at the independent interpreting
workstation using the DynaCAD thin client.
FINDINGS: Breast composition: b. Scattered fibroglandular tissue.

Background parenchymal enhancement: Mild

Right breast: Within the UPPER central anterior aspect of the RIGHT
breast there is an enhancing oval mass with angular margins
measuring 0.7 x 0.6 centimeters. (Image 50 of series 7). This mass
is T2 hyperintense and T1 hypointense. Weighted noncontrast images.
Mass shows washout type enhancement and is suspicious. RIGHT breast
is otherwise normal.

Left breast: Known malignancy in the LOWER INNER QUADRANT of the
LEFT breast spans 1.3 x 1.0 centimeters and is associated with
tissue marker clip artifact. This mass demonstrates washout type
enhancement kinetics. (Image 101 of series 7).

Post lumpectomy changes are seen in the LEFT breast. No other
suspicious changes in the LEFT breast.

Lymph nodes: No abnormal appearing lymph nodes.

Ancillary findings:  None.
IMPRESSION: 1. Known malignancy in the LOWER INNER QUADRANT of the LEFT breast
measures 1.3 centimeters. No other areas of concern in the LEFT
breast.
2. Suspicious mass in the UPPER central anterior portion of the
RIGHT breast warranting tissue diagnosis. Given the size, location,
and noncontrast features, there may be a sonographic correlate for
the mass.
3. No adenopathy.

RECOMMENDATION:
Recommended targeted ultrasound of the RIGHT breast to determine if
there is a sonographic correlate for the mass in the UPPER central
anterior RIGHT breast. If correlate is identified, recommend
ultrasound-guided core biopsy. If a sonographic correlate is not
clearly demonstrated, recommend MR guided core biopsy of the RIGHT
breast mass.

Recommend treatment plan for known LEFT breast cancer.

BI-RADS CATEGORY  4: Suspicious.

## 2024-01-24 NOTE — Telephone Encounter (Signed)
 Requesting: Ozempic  (2 MG/DOSE) 8 MG/3ML Subcutaneous Solution Pen-injector  Last Visit: 01/07/2024 Next Visit: 01/31/2024 Last Refill: 10/19/2023  Please Advise

## 2024-01-28 NOTE — Progress Notes (Deleted)
   Established Patient Office Visit  Subjective   Patient ID: Bonnie Reed, female    DOB: 1945-05-11  Age: 78 y.o. MRN: [id number]  No chief complaint on file.   HPI  {History (Optional):23778}  ROS    Objective:     LMP 10/17/1991 (Exact Date)  {Vitals History (Optional):23777}  Physical Exam   No results found for any visits on 01/31/24.  {Labs (Optional):23779}  The 10-year ASCVD risk score (Arnett DK, et al., 2019) is: 29.6%    Assessment & Plan:   Problem List Items Addressed This Visit   None   No follow-ups on file.    Tinnie DELENA Harada, NP

## 2024-01-31 ENCOUNTER — Ambulatory Visit: Admitting: Nurse Practitioner

## 2024-01-31 DIAGNOSIS — E119 Type 2 diabetes mellitus without complications: Secondary | ICD-10-CM | POA: Diagnosis not present

## 2024-01-31 DIAGNOSIS — H52203 Unspecified astigmatism, bilateral: Secondary | ICD-10-CM | POA: Diagnosis not present

## 2024-01-31 LAB — OPHTHALMOLOGY REPORT-SCANNED

## 2024-02-03 ENCOUNTER — Encounter: Payer: Self-pay | Admitting: Nurse Practitioner

## 2024-02-03 ENCOUNTER — Ambulatory Visit: Admitting: Nurse Practitioner

## 2024-02-03 VITALS — BP 126/70 | HR 86 | Temp 97.3°F | Ht 61.0 in | Wt 143.8 lb

## 2024-02-03 DIAGNOSIS — J0141 Acute recurrent pansinusitis: Secondary | ICD-10-CM | POA: Diagnosis not present

## 2024-02-03 DIAGNOSIS — I10 Essential (primary) hypertension: Secondary | ICD-10-CM

## 2024-02-03 MED ORDER — FLUTICASONE PROPIONATE 50 MCG/ACT NA SUSP: 2.0000 | Freq: Every day | NASAL | 3 refills | Status: AC | PRN

## 2024-02-03 MED ORDER — AMOXICILLIN-POT CLAVULANATE 875-125 MG PO TABS: 1.0000 | ORAL_TABLET | Freq: Two times a day (BID) | ORAL | 0 refills | Status: AC

## 2024-02-03 NOTE — Patient Instructions (Signed)
 It was great to see you!  Start another round of augmentin  twice a day   Keep using flonase  once a day  Increase zyrtec to twice a day for 1 week, then go back to once a day   Let's follow-up in 3 months, sooner if you have concerns.  If a referral was placed today, you will be contacted for an appointment. Please note that routine referrals can sometimes take up to 3-4 weeks to process. Please call our office if you haven't heard anything after this time frame.  Take care,  Tinnie Harada, NP

## 2024-02-03 NOTE — Progress Notes (Signed)
 Established Patient Office Visit  Subjective   Patient ID: LOREE SHEHATA, female    DOB: May 17, 1945  Age: 78 y.o. MRN: [id number]  Chief Complaint  Patient presents with   Hypertension    Follow up, concerns with head congestion-patient request a Covid test    HPI Discussed the use of AI scribe software for clinical note transcription with the patient, who gave verbal consent to proceed.  History of Present Illness   Alizon L Dant is a 78 year old female who presents with headache and nasal congestion.  She experiences congestion and headaches, particularly in the evenings, with worsening symptoms at sunset. Her symptoms improved with a previous course of antibiotics but have recurred. She consumes large amounts of orange juice and takes aspirin and Tylenol  for symptom management. She uses Zyrtec daily and was previously using Flonase , but her prescription has expired. She denies sore throat or ear pain but has itching in her ears. She experiences sneezing and some chest discomfort, which she attributes to her sleeping position. Her blood pressure readings have been near normal, with recent measurements of 120/60 and 126/70. She denies dizziness, lightheadedness, or shortness of breath.       ROS See pertinent positives and negatives per HPI.    Objective:     BP 126/70 (BP Location: Right Arm, Cuff Size: Large)   Pulse 86   Temp (!) 97.3 F (36.3 C)   Ht 5' 1 (1.549 m)   Wt 143 lb 12.8 oz (65.2 kg)   LMP 10/17/1991 (Exact Date)   SpO2 98%   BMI 27.17 kg/m  BP Readings from Last 3 Encounters:  02/03/24 126/70  01/07/24 128/62  12/21/23 (!) 80/46   Wt Readings from Last 3 Encounters:  02/03/24 143 lb 12.8 oz (65.2 kg)  01/07/24 145 lb 3.2 oz (65.9 kg)  11/02/23 143 lb 6.4 oz (65 kg)      Physical Exam Vitals and nursing note reviewed.  Constitutional:      General: She is not in acute distress.    Appearance: Normal appearance.  HENT:     Head:  Normocephalic.     Nose:     Right Sinus: Maxillary sinus tenderness and frontal sinus tenderness present.     Left Sinus: Maxillary sinus tenderness and frontal sinus tenderness present.  Eyes:     Conjunctiva/sclera: Conjunctivae normal.  Cardiovascular:     Rate and Rhythm: Normal rate and regular rhythm.     Pulses: Normal pulses.     Heart sounds: Normal heart sounds.  Pulmonary:     Effort: Pulmonary effort is normal.     Breath sounds: Normal breath sounds.  Musculoskeletal:     Cervical back: Normal range of motion and neck supple. No tenderness.  Lymphadenopathy:     Cervical: No cervical adenopathy.  Skin:    General: Skin is warm.  Neurological:     General: No focal deficit present.     Mental Status: She is alert and oriented to person, place, and time.  Psychiatric:        Mood and Affect: Mood normal.        Behavior: Behavior normal.        Thought Content: Thought content normal.        Judgment: Judgment normal.    The 10-year ASCVD risk score (Arnett DK, et al., 2019) is: 29%    Assessment & Plan:   Problem List Items Addressed This Visit  Cardiovascular and Mediastinum   Hypertension   Chronic, stable. Blood pressure has improved to 126/70 mmHg. She reports no current dizziness. Continue valsartan  40mg  daily and imdur  15mg  daily.         Respiratory   Acute recurrent pansinusitis - Primary   She experiences recurrent sinusitis with congestion, cheek pain, sneezing, and itching. COVID test is negative. Differential diagnosis includes allergies and sinusitis. Previous antibiotics provided temporary relief. Prescribe Augmentin  twice a day. Continue Flonase  once a day. Increase Zyrtec to twice a day for one week, then return to once a day.      Relevant Medications   fluticasone  (FLONASE ) 50 MCG/ACT nasal spray   amoxicillin -clavulanate (AUGMENTIN ) 875-125 MG tablet    Return in about 3 months (around 05/05/2024) for Diabetes, HTN.    Tinnie DELENA Harada, NP

## 2024-02-03 NOTE — Assessment & Plan Note (Signed)
 Chronic, stable. Blood pressure has improved to 126/70 mmHg. She reports no current dizziness. Continue valsartan  40mg  daily and imdur  15mg  daily.

## 2024-02-03 NOTE — Assessment & Plan Note (Signed)
 She experiences recurrent sinusitis with congestion, cheek pain, sneezing, and itching. COVID test is negative. Differential diagnosis includes allergies and sinusitis. Previous antibiotics provided temporary relief. Prescribe Augmentin  twice a day. Continue Flonase  once a day. Increase Zyrtec to twice a day for one week, then return to once a day.

## 2024-02-18 ENCOUNTER — Other Ambulatory Visit: Payer: Self-pay | Admitting: Nurse Practitioner

## 2024-02-21 ENCOUNTER — Telehealth: Payer: Self-pay

## 2024-02-21 NOTE — Telephone Encounter (Signed)
 Patient was identified as falling into the [employer] Measure - Diabetes.   Patient was: Appointment already scheduled for:  04/28/24.

## 2024-02-21 NOTE — Telephone Encounter (Signed)
 Requesting:  Lantus  SoloStar 100 UNIT/ML Subcutaneous Solution Pen-injector  Last Visit: 02/03/2024 Next Visit: Visit date not found Last Refill: 12/06/2023  Please Advise

## 2024-02-23 ENCOUNTER — Other Ambulatory Visit: Payer: Self-pay | Admitting: Nurse Practitioner

## 2024-02-28 ENCOUNTER — Other Ambulatory Visit: Payer: Self-pay | Admitting: Nurse Practitioner

## 2024-02-28 MED ORDER — ACCU-CHEK GUIDE TEST VI STRP: ORAL_STRIP | 12 refills | Status: DC

## 2024-02-28 MED ORDER — ACCU-CHEK GUIDE W/DEVICE KIT: 1.0000 | PACK | Freq: Every day | 0 refills | Status: AC

## 2024-02-28 NOTE — Progress Notes (Signed)
 Insurance changing preference from one touch to accu-chek in January. Orders placed to pharmacy.

## 2024-02-29 ENCOUNTER — Telehealth: Payer: Self-pay

## 2024-02-29 NOTE — Telephone Encounter (Signed)
 Patient was identified as falling into the [employer] [place] Measure - Diabetes.   Patient was: Appointment scheduled with primary care provider in the next 30 days.

## 2024-02-29 NOTE — Telephone Encounter (Signed)
 I called patient and left voicemail that diabetic supplies have been changed due to [facility] Formulary changing.

## 2024-03-03 ENCOUNTER — Other Ambulatory Visit: Payer: Self-pay | Admitting: Nurse Practitioner

## 2024-03-03 ENCOUNTER — Telehealth: Payer: Self-pay

## 2024-03-03 NOTE — Telephone Encounter (Signed)
 Patient was identified as falling into the [employer] [place] Measure - Diabetes.   Patient was: Appointment already scheduled for:  03/13/24.

## 2024-03-03 NOTE — Telephone Encounter (Signed)
 Requesting: Embecta Pen Needle Nano 2 Gen 32G X 4 MM Miscellaneous  Last Visit: 02/23/2024 Next Visit: 03/13/2024 Last Refill: 12/27/2023  Please Advise

## 2024-03-06 ENCOUNTER — Other Ambulatory Visit (INDEPENDENT_AMBULATORY_CARE_PROVIDER_SITE_OTHER)

## 2024-03-06 DIAGNOSIS — Z794 Long term (current) use of insulin: Secondary | ICD-10-CM | POA: Diagnosis not present

## 2024-03-06 DIAGNOSIS — E118 Type 2 diabetes mellitus with unspecified complications: Secondary | ICD-10-CM

## 2024-03-06 LAB — HEMOGLOBIN A1C: Hgb A1c MFr Bld: 7.1 % — ABNORMAL HIGH (ref 4.6–6.5)

## 2024-03-07 ENCOUNTER — Ambulatory Visit: Payer: Self-pay | Admitting: Nurse Practitioner

## 2024-03-13 ENCOUNTER — Ambulatory Visit: Admitting: Nurse Practitioner

## 2024-03-15 ENCOUNTER — Other Ambulatory Visit: Payer: Self-pay | Admitting: Nurse Practitioner

## 2024-03-15 NOTE — Telephone Encounter (Signed)
 Requesting: Ozempic  (2 MG/DOSE) 8 MG/3ML Subcutaneous Solution Pen-injector  Last Visit: 02/03/2024 Next Visit: Visit date not found Last Refill: 01/24/2024  Please Advise

## 2024-03-25 ENCOUNTER — Other Ambulatory Visit: Payer: Self-pay | Admitting: Nurse Practitioner

## 2024-03-27 NOTE — Telephone Encounter (Signed)
 Requesting: Jardiance  25 MG Oral Tablet  Last Visit: 02/03/2024 Next Visit: Visit date not found Last Refill: 09/29/2023  Please Advise

## 2024-03-28 DIAGNOSIS — Z170 Estrogen receptor positive status [ER+]: Secondary | ICD-10-CM

## 2024-03-30 ENCOUNTER — Other Ambulatory Visit: Payer: Self-pay | Admitting: Nurse Practitioner

## 2024-03-30 NOTE — Telephone Encounter (Signed)
 Requesting: Albuterol  Sulfate HFA 108 (90 Base) MCG/ACT Inhalation Aerosol Solution  Last Visit: 02/03/2024 Next Visit: Visit date not found Last Refill: 12/21/2023  Please Advise

## 2024-04-23 ENCOUNTER — Other Ambulatory Visit: Payer: Self-pay | Admitting: Nurse Practitioner

## 2024-04-24 ENCOUNTER — Other Ambulatory Visit: Payer: Self-pay | Admitting: Nurse Practitioner

## 2024-04-24 MED ORDER — ACCU-CHEK GUIDE TEST VI STRP: ORAL_STRIP | 12 refills | Status: AC

## 2024-04-28 ENCOUNTER — Ambulatory Visit: Payer: Medicare HMO

## 2024-05-01 ENCOUNTER — Ambulatory Visit

## 2024-06-19 ENCOUNTER — Ambulatory Visit
